# Patient Record
Sex: Male | Born: 1960
Health system: Southern US, Community
[De-identification: ages and names within clinical notes are randomized; demographics above are authoritative.]

## PROBLEM LIST (undated history)

## (undated) DIAGNOSIS — E785 Hyperlipidemia, unspecified: Secondary | ICD-10-CM

## (undated) DIAGNOSIS — E119 Type 2 diabetes mellitus without complications: Secondary | ICD-10-CM

## (undated) HISTORY — DX: Hyperlipidemia, unspecified: E78.5

## (undated) HISTORY — DX: Type 2 diabetes mellitus without complications: E11.9

## (undated) HISTORY — PX: NO PAST SURGERIES: SHX2092

---

## 2014-07-20 ENCOUNTER — Encounter: Payer: Self-pay | Admitting: Nurse Practitioner

## 2014-07-20 ENCOUNTER — Ambulatory Visit (INDEPENDENT_AMBULATORY_CARE_PROVIDER_SITE_OTHER): Payer: Commercial Managed Care - HMO | Admitting: Nurse Practitioner

## 2014-07-20 ENCOUNTER — Ambulatory Visit (INDEPENDENT_AMBULATORY_CARE_PROVIDER_SITE_OTHER): Payer: Commercial Managed Care - HMO

## 2014-07-20 VITALS — BP 130/91 | HR 92 | Temp 97.2°F | Ht 69.0 in | Wt 190.0 lb

## 2014-07-20 DIAGNOSIS — Z87891 Personal history of nicotine dependence: Secondary | ICD-10-CM

## 2014-07-20 DIAGNOSIS — R739 Hyperglycemia, unspecified: Secondary | ICD-10-CM

## 2014-07-20 DIAGNOSIS — Z713 Dietary counseling and surveillance: Secondary | ICD-10-CM

## 2014-07-20 DIAGNOSIS — H3552 Pigmentary retinal dystrophy: Secondary | ICD-10-CM | POA: Insufficient documentation

## 2014-07-20 DIAGNOSIS — Z1211 Encounter for screening for malignant neoplasm of colon: Secondary | ICD-10-CM

## 2014-07-20 DIAGNOSIS — Z0000 Encounter for general adult medical examination without abnormal findings: Secondary | ICD-10-CM

## 2014-07-20 DIAGNOSIS — Z6828 Body mass index (BMI) 28.0-28.9, adult: Secondary | ICD-10-CM

## 2014-07-20 LAB — POCT CBC
Granulocyte percent: 59.4 %G (ref 37–80)
HEMATOCRIT: 52.8 % (ref 43.5–53.7)
Hemoglobin: 17 g/dL (ref 14.1–18.1)
LYMPH, POC: 2.5 (ref 0.6–3.4)
MCH: 29.4 pg (ref 27–31.2)
MCHC: 32.2 g/dL (ref 31.8–35.4)
MCV: 91.3 fL (ref 80–97)
MPV: 9 fL (ref 0–99.8)
POC Granulocyte: 4.1 (ref 2–6.9)
POC LYMPH PERCENT: 36.8 %L (ref 10–50)
Platelet Count, POC: 253 10*3/uL (ref 142–424)
RBC: 5.8 M/uL (ref 4.69–6.13)
RDW, POC: 12.8 %
WBC: 6.9 10*3/uL (ref 4.6–10.2)

## 2014-07-20 NOTE — Progress Notes (Addendum)
   Subjective:    Patient ID: Patrick Valentine, male    DOB: 02/22/1961, 53 y.o.   MRN: [id number]  HPI Patient in today for CPE - He is doing well- Is on no meds and has no medical problems other than retinitis pigmentosis- see eye dr every 6 months    Review of Systems  Constitutional: Negative.   HENT: Negative.   Respiratory: Negative.   Cardiovascular: Negative for chest pain, palpitations and leg swelling.  Gastrointestinal: Negative.   Endocrine: Negative.   Genitourinary: Negative.   Neurological: Negative.   Hematological: Negative.   Psychiatric/Behavioral: Negative.   All other systems reviewed and are negative.      Objective:   Physical Exam  Constitutional: He is oriented to person, place, and time. He appears well-developed and well-nourished.  HENT:  Head: Normocephalic.  Right Ear: External ear normal.  Left Ear: External ear normal.  Nose: Nose normal.  Mouth/Throat: Oropharynx is clear and moist.  Eyes: EOM are normal. Pupils are equal, round, and reactive to light.  Neck: Normal range of motion. Neck supple. No JVD present. No thyromegaly present.  Cardiovascular: Normal rate, regular rhythm, normal heart sounds and intact distal pulses.  Exam reveals no gallop and no friction rub.   No murmur heard. Pulmonary/Chest: Effort normal and breath sounds normal. No respiratory distress. He has no wheezes. He has no rales. He exhibits no tenderness.  Abdominal: Soft. Bowel sounds are normal. He exhibits no mass. There is no tenderness.  Musculoskeletal: Normal range of motion. He exhibits no edema.  Lymphadenopathy:    He has no cervical adenopathy.  Neurological: He is alert and oriented to person, place, and time. No cranial nerve deficit.  Skin: Skin is warm and dry.  Psychiatric: He has a normal mood and affect. His behavior is normal. Judgment and thought content normal.   BP 130/91  Pulse 92  Temp(Src) 97.2 F (36.2 C) (Oral)  Ht $R'5\' 9"'EO$  (1.753 m)  Wt  190 lb (86.183 kg)  BMI 28.05 kg/m2  Chest x ray- normal-Preliminary reading by Patrick Collum, FNP  [facility]  Patrick Nissen, FNP       Assessment & Plan:   1. BMI 28.0-28.9,adult   2. Weight loss counseling, encounter for   3. Retinitis pigmentosa of both eyes   4. Smoking history   5. Annual physical exam   6. Encounter for screening colonoscopy    Orders Placed This Encounter  Procedures  . DG Chest 2 View    Standing Status: Future     Number of Occurrences:      Standing Expiration Date: 09/19/2015    Order Specific Question:  Reason for Exam (SYMPTOM  OR DIAGNOSIS REQUIRED)    Answer:  screening    Order Specific Question:  Preferred imaging location?    Answer:  Internal  . CMP14+EGFR  . NMR, lipoprofile  . Thyroid Panel With TSH  . PSA, total and free  . POCT CBC  . EKG 12-Lead   scheding colonoscopy Labs pending Health maintenance reviewed Diet and exercise encouraged Continue all meds Follow up  In 1 year   [name], FNP

## 2014-07-20 NOTE — Patient Instructions (Signed)

## 2014-07-21 ENCOUNTER — Other Ambulatory Visit: Payer: Self-pay | Admitting: Nurse Practitioner

## 2014-07-21 LAB — CMP14+EGFR
A/G RATIO: 1.9 (ref 1.1–2.5)
ALK PHOS: 94 IU/L (ref 39–117)
ALT: 65 IU/L — AB (ref 0–44)
AST: 31 IU/L (ref 0–40)
Albumin: 4.6 g/dL (ref 3.5–5.5)
BUN / CREAT RATIO: 11 (ref 9–20)
BUN: 11 mg/dL (ref 6–24)
CALCIUM: 9.8 mg/dL (ref 8.7–10.2)
CO2: 24 mmol/L (ref 18–29)
CREATININE: 1 mg/dL (ref 0.76–1.27)
Chloride: 92 mmol/L — ABNORMAL LOW (ref 97–108)
GFR, EST AFRICAN AMERICAN: 99 mL/min/{1.73_m2} (ref >59)
GFR, EST NON AFRICAN AMERICAN: 86 mL/min/{1.73_m2} (ref >59)
GLOBULIN, TOTAL: 2.4 g/dL (ref 1.5–4.5)
Glucose: 270 mg/dL — ABNORMAL HIGH (ref 65–99)
POTASSIUM: 4.5 mmol/L (ref 3.5–5.2)
SODIUM: 132 mmol/L — AB (ref 134–144)
Total Bilirubin: 0.6 mg/dL (ref 0.0–1.2)
Total Protein: 7 g/dL (ref 6.0–8.5)

## 2014-07-21 LAB — NMR, LIPOPROFILE
Cholesterol: 359 mg/dL — ABNORMAL HIGH (ref 100–199)
HDL CHOLESTEROL BY NMR: 40 mg/dL (ref >39)
HDL PARTICLE NUMBER: 33 umol/L (ref >=30.5)
LDL Particle Number: 2598 nmol/L — ABNORMAL HIGH (ref <1000)
LDL SIZE: 20 nm (ref >20.5)
LDLC SERPL CALC-MCNC: 262 mg/dL — ABNORMAL HIGH (ref 0–99)
LP-IR Score: 54 — ABNORMAL HIGH (ref <=45)
Small LDL Particle Number: 1868 nmol/L — ABNORMAL HIGH (ref <=527)
Triglycerides by NMR: 284 mg/dL — ABNORMAL HIGH (ref 0–149)

## 2014-07-21 LAB — PSA, TOTAL AND FREE
PSA, Free Pct: 18.4 %
PSA, Free: 0.46 ng/mL
PSA: 2.5 ng/mL (ref 0.0–4.0)

## 2014-07-21 LAB — THYROID PANEL WITH TSH
Free Thyroxine Index: 2.2 (ref 1.2–4.9)
T3 Uptake Ratio: 29 % (ref 24–39)
T4, Total: 7.7 ug/dL (ref 4.5–12.0)
TSH: 2.82 u[IU]/mL (ref 0.450–4.500)

## 2014-07-21 MED ORDER — ROSUVASTATIN CALCIUM 20 MG PO TABS: 20.0000 mg | ORAL_TABLET | Freq: Every day | ORAL | Status: DC

## 2014-07-21 NOTE — Addendum Note (Signed)
Addended by: Chevis Pretty on: 07/21/2014 08:18 AM   Modules accepted: Orders

## 2014-07-23 ENCOUNTER — Other Ambulatory Visit: Payer: Self-pay | Admitting: Nurse Practitioner

## 2014-07-23 ENCOUNTER — Telehealth: Payer: Self-pay | Admitting: Nurse Practitioner

## 2014-07-23 LAB — POCT GLYCOSYLATED HEMOGLOBIN (HGB A1C)

## 2014-07-23 MED ORDER — METFORMIN HCL 1000 MG PO TABS: 1000.0000 mg | ORAL_TABLET | Freq: Two times a day (BID) | ORAL | Status: DC

## 2014-07-23 NOTE — Telephone Encounter (Signed)
FYI: Crestor cost $45 monthly. He will try it for a few months and see how he tolerates it and how his cholesterol levels are after a few months.   Discussed A1C results with patient as well. He wants to know if he can adjust diet to control the level. Explained that initially he may start out on medication in conjunction with diet and exercise to manage his levels but that his provider will have recommendations for treatment.

## 2014-07-23 NOTE — Addendum Note (Signed)
Addended by: Wyline Mood on: 07/23/2014 08:26 AM   Modules accepted: Orders

## 2014-07-24 ENCOUNTER — Encounter: Payer: Self-pay | Admitting: Internal Medicine

## 2014-08-09 ENCOUNTER — Ambulatory Visit (INDEPENDENT_AMBULATORY_CARE_PROVIDER_SITE_OTHER): Payer: Commercial Managed Care - HMO | Admitting: Pharmacist

## 2014-08-09 ENCOUNTER — Encounter: Payer: Self-pay | Admitting: Pharmacist

## 2014-08-09 VITALS — BP 132/77 | HR 82 | Ht 69.0 in | Wt 190.0 lb

## 2014-08-09 DIAGNOSIS — E119 Type 2 diabetes mellitus without complications: Secondary | ICD-10-CM | POA: Insufficient documentation

## 2014-08-09 DIAGNOSIS — E785 Hyperlipidemia, unspecified: Secondary | ICD-10-CM

## 2014-08-09 DIAGNOSIS — E1169 Type 2 diabetes mellitus with other specified complication: Secondary | ICD-10-CM

## 2014-08-09 MED ORDER — GLUCOSE BLOOD VI STRP: ORAL_STRIP | Status: DC

## 2014-08-09 MED ORDER — ONETOUCH DELICA LANCETS FINE MISC: Status: DC

## 2014-08-09 NOTE — Progress Notes (Signed)
Subjective:    Eyoel Throgmorton is a 53 y.o. male who presents for an initial evaluation and education of Type 2 diabetes mellitus.  Current symptoms/problems include polydipsia and have been improving. Symptoms have been present for 4 months.  The patient was initially diagnosed with Type 2 diabetes mellitus based on the following criteria:  A1c of 12.6% and RBG of 270.  Patient was started on metformin 1000mg  1 tablet BID 07/23/14.  He c/o of diarrhea, decreased appetite and fatigue since starting metformin.  Known diabetic complications: none Cardiovascular risk factors: diabetes mellitus, dyslipidemia and male gender  Eye exam current (within one year): no Weight trend: stable Prior visit with dietician: no Current diet: in general, an "unhealthy" diet Current exercise: yard work  Current monitoring regimen: none Home blood sugar records: n/a - patient doen't have glucometer at home Any episodes of hypoglycemia? no  Is He on ACE inhibitor or angiotensin II receptor blocker?  No   The following portions of the patient's history were reviewed and updated as appropriate: allergies, current medications, past family history, past medical history, past social history and problem list.  Objective:    BP 132/77  Pulse 82  Ht 5\' 9"  (1.753 m)  Wt 190 lb (86.183 kg)  BMI 28.05 kg/m2   Lab Review Glucose (mg/dL)  Date Value  07/20/2014 270*     CO2 (mmol/L)  Date Value  07/20/2014 24      BUN (mg/dL)  Date Value  07/20/2014 11      Creatinine, Ser (mg/dL)  Date Value  07/20/2014 1.00    A1c = 12.6% 07/21/14  Assessment:    Diabetes Mellitus type II, under inadequate control - newly diagnosed 07/21/2014 Hyperlipidemia - just started crestor.    Plan:    1.  Rx changes: to decreased side effects decrease metformin to 1/2 tablet BID for next week and then try to increase back to 1000mg  bid 2.  Education: Reviewed 'ABCs' of diabetes management (respective goals in  parentheses):  A1C (<7), blood pressure (<130/80), and cholesterol (LDL <100). 3.  Compliance at present is estimated to be fair. Efforts to improve compliance (if necessary) will be directed at dietary modifications: discussed CHO counting indepth with patinet and wife.  45g per meal and 15 grams per snack, increased exercise and regular blood sugar monitoring: one times daily 4. Patient given glucometer (One Touch Verio) in office today.  Taught how to use glucometer, BG goals and when to check.  5. Follow up: 2 months   Cherre Robins, PharmD, CPP, CDE

## 2014-08-09 NOTE — Patient Instructions (Addendum)
Diabetes and Standards of Medical Care  Diabetes is complicated. You may find that your diabetes team includes a dietitian, nurse, diabetes educator, eye doctor, and more. To help everyone know what is going on and to help you get the care you deserve, the following schedule of care was developed to help keep you on track. Below are the tests, exams, vaccines, medicines, education, and plans you will need.  Blood Glucose Goals Prior to meals = 80 - 130 Within 2 hours of the start of a meal = less than 180  HbA1c test (goal is less than 7.0% - your last value was 12.6%) This test shows how well you have controlled your glucose over the past 2 3 months. It is used to see if your diabetes management plan needs to be adjusted.   It is performed at least 2 times a year if you are meeting treatment goals.  It is performed 4 times a year if therapy has changed or if you are not meeting treatment goals.   Blood pressure test  This test is performed at every routine medical visit. The goal is less than 140/90 mmHg for most people, but 130/80 mmHg in some cases. Ask your health care provider about your goal. Dental exam  Follow up with the dentist regularly. Eye exam  If you are diagnosed with type 1 diabetes as a child, get an exam upon reaching the age of 29 years or older and have had diabetes for 3 5 years. Yearly eye exams are recommended after that initial eye exam.  If you are diagnosed with type 1 diabetes as an adult, get an exam within 5 years of diagnosis and then yearly.  If you are diagnosed with type 2 diabetes, get an exam as soon as possible after the diagnosis and then yearly. Foot care exam  Visual foot exams are performed at every routine medical visit. The exams check for cuts, injuries, or other problems with the feet.  A comprehensive foot exam should be done yearly. This includes visual inspection as well as assessing foot pulses and testing for loss of  sensation.  Check your feet nightly for cuts, injuries, or other problems with your feet. Tell your health care provider if anything is not healing. Kidney function test (urine microalbumin)  This test is performed once a year.  Type 1 diabetes: The first test is performed 5 years after diagnosis.  Type 2 diabetes: The first test is performed at the time of diagnosis.  A serum creatinine and estimated glomerular filtration rate (eGFR) test is done once a year to assess the level of chronic kidney disease (CKD), if present. Lipid profile (cholesterol, HDL, LDL, triglycerides)  Performed every 5 years for most people.  The goal for LDL is less than 100 mg/dL. If you are at high risk, the goal is less than 70 mg/dL.  The goal for HDL is 40 mg/dL 50 mg/dL for men and 50 mg/dL 60 mg/dL for women. An HDL cholesterol of 60 mg/dL or higher gives some protection against heart disease.  The goal for triglycerides is less than 150 mg/dL. Influenza vaccine, pneumococcal vaccine, and hepatitis B vaccine  The influenza vaccine is recommended yearly.  The pneumococcal vaccine is generally given once in a lifetime. However, there are some instances when another vaccination is recommended. Check with your health care provider.  The hepatitis B vaccine is also recommended for adults with diabetes. Diabetes self-management education  Education is recommended at diagnosis and ongoing  as needed. Treatment plan  Your treatment plan is reviewed at every medical visit. Document Released: 09/27/2009 Document Revised: 08/02/2013 Document Reviewed: 05/02/2013 [facility] Patient Information 2014 [place].

## 2014-09-10 ENCOUNTER — Ambulatory Visit (AMBULATORY_SURGERY_CENTER): Payer: Self-pay | Admitting: *Deleted

## 2014-09-10 VITALS — Ht 69.0 in | Wt 188.2 lb

## 2014-09-10 DIAGNOSIS — Z1211 Encounter for screening for malignant neoplasm of colon: Secondary | ICD-10-CM

## 2014-09-10 MED ORDER — MOVIPREP 100 G PO SOLR: ORAL | Status: DC

## 2014-09-10 NOTE — Progress Notes (Signed)
No allergies to eggs or soy. No prior anesthesia.  Pt given Emmi instructions for colonoscopy  No oxygen use  No diet drug use  

## 2014-09-24 ENCOUNTER — Ambulatory Visit (AMBULATORY_SURGERY_CENTER): Payer: Commercial Managed Care - HMO | Admitting: Internal Medicine

## 2014-09-24 ENCOUNTER — Encounter: Payer: Self-pay | Admitting: Internal Medicine

## 2014-09-24 VITALS — BP 135/90 | HR 68 | Temp 97.4°F | Resp 22 | Ht 69.0 in | Wt 188.0 lb

## 2014-09-24 DIAGNOSIS — D123 Benign neoplasm of transverse colon: Secondary | ICD-10-CM | POA: Diagnosis not present

## 2014-09-24 DIAGNOSIS — Z1211 Encounter for screening for malignant neoplasm of colon: Secondary | ICD-10-CM

## 2014-09-24 DIAGNOSIS — D125 Benign neoplasm of sigmoid colon: Secondary | ICD-10-CM

## 2014-09-24 MED ORDER — SODIUM CHLORIDE 0.9 % IV SOLN: 500.0000 mL | INTRAVENOUS | Status: DC

## 2014-09-24 NOTE — Progress Notes (Signed)
Procedure ends, to recovery, report given and VSS. 

## 2014-09-24 NOTE — Progress Notes (Signed)
Called to room to assist during endoscopic procedure.  Patient ID and intended procedure confirmed with present staff. Received instructions for my participation in the procedure from the performing physician.  

## 2014-09-24 NOTE — Patient Instructions (Signed)
Discharge instructions given with verbal understanding. Handouts on polyps and diverticulosis. Resume previous medications. YOU HAD AN ENDOSCOPIC PROCEDURE TODAY AT THE [facility]: Refer to the procedure report that was given to you for any specific questions about what was found during the examination.  If the procedure report does not answer your questions, please call your gastroenterologist to clarify.  If you requested that your care partner not be given the details of your procedure findings, then the procedure report has been included in a sealed envelope for you to review at your convenience later.  YOU SHOULD EXPECT: Some feelings of bloating in the abdomen. Passage of more gas than usual.  Walking can help get rid of the air that was put into your GI tract during the procedure and reduce the bloating. If you had a lower endoscopy (such as a colonoscopy or flexible sigmoidoscopy) you may notice spotting of blood in your stool or on the toilet paper. If you underwent a bowel prep for your procedure, then you may not have a normal bowel movement for a few days.  DIET: Your first meal following the procedure should be a light meal and then it is ok to progress to your normal diet.  A half-sandwich or bowl of soup is an example of a good first meal.  Heavy or fried foods are harder to digest and may make you feel nauseous or bloated.  Likewise meals heavy in dairy and vegetables can cause extra gas to form and this can also increase the bloating.  Drink plenty of fluids but you should avoid alcoholic beverages for 24 hours.  ACTIVITY: Your care partner should take you home directly after the procedure.  You should plan to take it easy, moving slowly for the rest of the day.  You can resume normal activity the day after the procedure however you should NOT DRIVE or use heavy machinery for 24 hours (because of the sedation medicines used during the test).    SYMPTOMS TO REPORT  IMMEDIATELY: A gastroenterologist can be reached at any hour.  During normal business hours, 8:30 AM to 5:00 PM Monday through Friday, call [phone].  After hours and on weekends, please call the GI answering service at [phone] who will take a message and have the physician on call contact you.   Following lower endoscopy (colonoscopy or flexible sigmoidoscopy):  Excessive amounts of blood in the stool  Significant tenderness or worsening of abdominal pains  Swelling of the abdomen that is new, acute  Fever of 100F or higher  FOLLOW UP: If any biopsies were taken you will be contacted by phone or by letter within the next 1-3 weeks.  Call your gastroenterologist if you have not heard about the biopsies in 3 weeks.  Our staff will call the home number listed on your records the next business day following your procedure to check on you and address any questions or concerns that you may have at that time regarding the information given to you following your procedure. This is a courtesy call and so if there is no answer at the home number and we have not heard from you through the emergency physician on call, we will assume that you have returned to your regular daily activities without incident.  SIGNATURES/CONFIDENTIALITY: You and/or your care partner have signed paperwork which will be entered into your electronic medical record.  These signatures attest to the fact that that the information above on your After Visit Summary   has been reviewed and is understood.  Full responsibility of the confidentiality of this discharge information lies with you and/or your care-partner. 

## 2014-09-24 NOTE — Op Note (Signed)
[name], [id number]   COLONOSCOPY PROCEDURE REPORT  PATIENT: Patrick Valentine, Patrick Valentine  MR#: [id number] BIRTHDATE: [birth year] , 56  yrs. old GENDER: male ENDOSCOPIST: Jerene Bears, MD REFERRED HL:[id number] Laurance Flatten, M.D. PROCEDURE DATE:  09/24/2014 PROCEDURE:   Colonoscopy with snare polypectomy First Screening Colonoscopy - Avg.  risk and is 50 yrs.  old or older Yes.  Prior Negative Screening - Now for repeat screening. N/A  History of Adenoma - Now for follow-up colonoscopy & has been > or = to 3 yrs.  N/A  Polyps Removed Today? Yes. ASA CLASS:   Class II INDICATIONS:average risk for colorectal cancer and first colonoscopy. MEDICATIONS: Monitored anesthesia care and Propofol 320 mg IV  DESCRIPTION OF PROCEDURE:   After the risks benefits and alternatives of the procedure were thoroughly explained, informed consent was obtained.  The digital rectal exam revealed no rectal mass.   The LB [id number]  endoscope was introduced through the anus and advanced to the cecum, which was identified by both the appendix and ileocecal valve. No adverse events experienced. The quality of the prep was good, using MoviPrep  The instrument was then slowly withdrawn as the colon was fully examined.   COLON FINDINGS: A sessile polyp measuring 2 mm in size was found at the hepatic flexure.  A polypectomy was performed with a cold snare.  The resection was complete, the polyp tissue was completely retrieved and sent to histology.   A semi-pedunculated polyp measuring 6 mm in size was found in the sigmoid colon.  A polypectomy was performed using snare cautery.  The resection was complete, the polyp tissue was completely retrieved and sent to histology.   There was mild diverticulosis noted in the sigmoid colon.  Retroflexed views revealed internal hemorrhoids. The time to cecum=3 minutes 56 seconds.  Withdrawal time=12 minutes 36 seconds.  The scope was  withdrawn and the procedure completed. COMPLICATIONS: There were no immediate complications.  ENDOSCOPIC IMPRESSION: 1.   Sessile polyp was found at the hepatic flexure; polypectomy was performed with a cold snare 2.   Semi-pedunculated polyp was found in the sigmoid colon; polypectomy was performed using snare cautery 3.   Mild diverticulosis was noted in the sigmoid colon  RECOMMENDATIONS: 1.  Await pathology results 2.  High fiber diet 3.  If the polyps removed today are proven to be adenomatous (pre-cancerous) polyps, you will need a repeat colonoscopy in 5 years.  Otherwise you should continue to follow colorectal cancer screening guidelines for "routine risk" patients with colonoscopy in 10 years.  You will receive a letter within 1-2 weeks with the results of your biopsy as well as final recommendations.  Please call my office if you have not received a letter after 3 weeks.  eSigned:  Jerene Bears, MD 09/24/2014 9:01 AM       cc: The Patient and Redge Gainer, MD

## 2014-09-24 NOTE — Progress Notes (Signed)
Patient denies any allergies to eggs or soy. No past surgeries per pt.

## 2014-09-25 ENCOUNTER — Telehealth: Payer: Self-pay

## 2014-09-25 NOTE — Telephone Encounter (Signed)
  Follow up Call-  Call back number 09/24/2014  Post procedure Call Back phone  # [phone]  Permission to leave phone message Yes     Patient questions:  Do you have a fever, pain , or abdominal swelling? No. Pain Score  0 *  Have you tolerated food without any problems? Yes.    Have you been able to return to your normal activities? Yes.    Do you have any questions about your discharge instructions: Diet   No. Medications  No. Follow up visit  No.  Do you have questions or concerns about your Care? No.  Actions: * If pain score is 4 or above: No action needed, pain <4.

## 2014-09-27 ENCOUNTER — Encounter: Payer: Self-pay | Admitting: Internal Medicine

## 2014-10-25 ENCOUNTER — Encounter: Payer: Self-pay | Admitting: Nurse Practitioner

## 2014-10-25 ENCOUNTER — Ambulatory Visit (INDEPENDENT_AMBULATORY_CARE_PROVIDER_SITE_OTHER): Payer: Commercial Managed Care - HMO | Admitting: Nurse Practitioner

## 2014-10-25 VITALS — BP 140/92 | HR 84 | Temp 99.7°F | Ht 69.0 in | Wt 183.4 lb

## 2014-10-25 DIAGNOSIS — E119 Type 2 diabetes mellitus without complications: Secondary | ICD-10-CM

## 2014-10-25 DIAGNOSIS — E1169 Type 2 diabetes mellitus with other specified complication: Secondary | ICD-10-CM

## 2014-10-25 DIAGNOSIS — Z23 Encounter for immunization: Secondary | ICD-10-CM

## 2014-10-25 DIAGNOSIS — E785 Hyperlipidemia, unspecified: Secondary | ICD-10-CM

## 2014-10-25 LAB — POCT GLYCOSYLATED HEMOGLOBIN (HGB A1C): Hemoglobin A1C: 6.3

## 2014-10-25 LAB — POCT UA - MICROALBUMIN: Microalbumin Ur, POC: NEGATIVE mg/L

## 2014-10-25 MED ORDER — ROSUVASTATIN CALCIUM 20 MG PO TABS: 20.0000 mg | ORAL_TABLET | Freq: Every day | ORAL | Status: DC

## 2014-10-25 MED ORDER — METFORMIN HCL 1000 MG PO TABS: 1000.0000 mg | ORAL_TABLET | Freq: Two times a day (BID) | ORAL | Status: DC

## 2014-10-25 NOTE — Progress Notes (Signed)
Subjective:    Patient ID: Patrick Valentine, male    DOB: March 30, 1961, 53 y.o.   MRN: [id number]  Hyperlipidemia This is a chronic problem. The current episode started more than 1 year ago. The problem is uncontrolled. Recent lipid tests were reviewed and are high. Exacerbating diseases include diabetes. He has no history of hypothyroidism or obesity. Pertinent negatives include no chest pain. Current antihyperlipidemic treatment includes statins. Improvement on treatment: first time having labs drawn since going on meds. Compliance problems include adherence to diet and adherence to exercise.  Risk factors for coronary artery disease include diabetes mellitus, dyslipidemia and male sex.  Diabetes He presents for his follow-up diabetic visit. He has type 2 diabetes mellitus. No MedicAlert identification noted. His disease course has been improving. Pertinent negatives for diabetes include no chest pain. Symptoms are improving. Current diabetic treatment includes oral agent (monotherapy). He is compliant with treatment all of the time. His weight is stable. He is following a diabetic diet. He has not had a previous visit with a dietitian. He participates in exercise every other day. His home blood glucose trend is decreasing steadily. His breakfast blood glucose is taken between 8-9 am. His breakfast blood glucose range is generally 110-130 mg/dl. His overall blood glucose range is 110-130 mg/dl. An ACE inhibitor/angiotensin II receptor blocker is not being taken. He does not see a podiatrist.Eye exam is not current.   Patient here today for follow up of chronic medical problems.   Review of Systems  Constitutional: Negative.   HENT: Negative.   Respiratory: Negative.   Cardiovascular: Negative for chest pain, palpitations and leg swelling.  Gastrointestinal: Negative.   Endocrine: Negative.   Genitourinary: Negative.   Neurological: Negative.   Hematological: Negative.   Psychiatric/Behavioral:  Negative.   All other systems reviewed and are negative.      Objective:   Physical Exam  Constitutional: He is oriented to person, place, and time. He appears well-developed and well-nourished.  HENT:  Head: Normocephalic.  Right Ear: External ear normal.  Left Ear: External ear normal.  Nose: Nose normal.  Mouth/Throat: Oropharynx is clear and moist.  Eyes: EOM are normal. Pupils are equal, round, and reactive to light.  Neck: Normal range of motion. Neck supple. No JVD present. No thyromegaly present.  Cardiovascular: Normal rate, regular rhythm, normal heart sounds and intact distal pulses.  Exam reveals no gallop and no friction rub.   No murmur heard. Pulmonary/Chest: Effort normal and breath sounds normal. No respiratory distress. He has no wheezes. He has no rales. He exhibits no tenderness.  Abdominal: Soft. Bowel sounds are normal. He exhibits no mass. There is no tenderness.  Genitourinary: Prostate normal and penis normal.  Musculoskeletal: Normal range of motion. He exhibits no edema.  Lymphadenopathy:    He has no cervical adenopathy.  Neurological: He is alert and oriented to person, place, and time. No cranial nerve deficit.  Skin: Skin is warm and dry.  Psychiatric: He has a normal mood and affect. His behavior is normal. Judgment and thought content normal.   BP 140/92 mmHg  Pulse 84  Temp(Src) 99.7 F (37.6 C) (Oral)  Ht 5' 9"  (1.753 m)  Wt 183 lb 6.4 oz (83.19 kg)  BMI 27.07 kg/m2  Results for orders placed or performed in visit on 10/25/14  POCT glycosylated hemoglobin (Hb A1C)  Result Value Ref Range   Hemoglobin A1C 6.3          Assessment & Plan:   1.  Hyperlipidemia associated with type 2 diabetes mellitus Low fat diet - CMP14+EGFR - NMR, lipoprofile - metFORMIN (GLUCOPHAGE) 1000 MG tablet; Take 1 tablet (1,000 mg total) by mouth 2 (two) times daily with a meal.  Dispense: 180 tablet; Refill: 1  2. Type 2 diabetes mellitus not at  goal Continue carb counting - POCT glycosylated hemoglobin (Hb A1C) - POCT UA - Microalbumin - rosuvastatin (CRESTOR) 20 MG tablet; Take 1 tablet (20 mg total) by mouth daily.  Dispense: 90 tablet; Refill: 1  3. Encounter for immunization    Labs pending Health maintenance reviewed Diet and exercise encouraged Continue all meds Follow up  In 3 month   [place], FNP

## 2014-10-25 NOTE — Patient Instructions (Signed)

## 2014-10-26 LAB — NMR, LIPOPROFILE
Cholesterol: 254 mg/dL — ABNORMAL HIGH (ref 100–199)
HDL CHOLESTEROL BY NMR: 45 mg/dL (ref >39)
HDL Particle Number: 29 umol/L — ABNORMAL LOW (ref >=30.5)
LDL Particle Number: 2820 nmol/L — ABNORMAL HIGH (ref <1000)
LDL Size: 20.4 nm (ref >20.5)
LDL-C: 182 mg/dL — AB (ref 0–99)
LP-IR Score: 61 — ABNORMAL HIGH (ref <=45)
Small LDL Particle Number: 1832 nmol/L — ABNORMAL HIGH (ref <=527)
Triglycerides by NMR: 133 mg/dL (ref 0–149)

## 2014-10-26 LAB — CMP14+EGFR
ALT: 34 IU/L (ref 0–44)
AST: 26 IU/L (ref 0–40)
Albumin/Globulin Ratio: 2 (ref 1.1–2.5)
Albumin: 4.8 g/dL (ref 3.5–5.5)
Alkaline Phosphatase: 59 IU/L (ref 39–117)
BUN/Creatinine Ratio: 14 (ref 9–20)
BUN: 13 mg/dL (ref 6–24)
CALCIUM: 10.2 mg/dL (ref 8.7–10.2)
CO2: 24 mmol/L (ref 18–29)
CREATININE: 0.95 mg/dL (ref 0.76–1.27)
Chloride: 100 mmol/L (ref 97–108)
GFR calc Af Amer: 104 mL/min/{1.73_m2} (ref >59)
GFR calc non Af Amer: 90 mL/min/{1.73_m2} (ref >59)
GLUCOSE: 131 mg/dL — AB (ref 65–99)
Globulin, Total: 2.4 g/dL (ref 1.5–4.5)
POTASSIUM: 5.3 mmol/L — AB (ref 3.5–5.2)
Sodium: 140 mmol/L (ref 134–144)
TOTAL PROTEIN: 7.2 g/dL (ref 6.0–8.5)
Total Bilirubin: 0.6 mg/dL (ref 0.0–1.2)

## 2014-10-31 ENCOUNTER — Telehealth: Payer: Self-pay | Admitting: Nurse Practitioner

## 2014-10-31 NOTE — Telephone Encounter (Signed)
Pt aware of lab values, had questions about cholesterol results. Reassured him that values have come down since Sept and to continue meds and exercise.

## 2015-01-15 ENCOUNTER — Telehealth: Payer: Self-pay | Admitting: Nurse Practitioner

## 2015-01-15 DIAGNOSIS — L989 Disorder of the skin and subcutaneous tissue, unspecified: Secondary | ICD-10-CM

## 2015-01-23 NOTE — Telephone Encounter (Signed)
Patient wants referral to dermatology to check a few places that are of concern. He would like to go [facility] [place].

## 2015-01-24 ENCOUNTER — Other Ambulatory Visit: Payer: Self-pay | Admitting: *Deleted

## 2015-01-24 ENCOUNTER — Telehealth: Payer: Self-pay | Admitting: Nurse Practitioner

## 2015-01-24 DIAGNOSIS — E785 Hyperlipidemia, unspecified: Principal | ICD-10-CM

## 2015-01-24 DIAGNOSIS — E1169 Type 2 diabetes mellitus with other specified complication: Secondary | ICD-10-CM

## 2015-01-24 DIAGNOSIS — E119 Type 2 diabetes mellitus without complications: Secondary | ICD-10-CM

## 2015-01-24 MED ORDER — METFORMIN HCL 1000 MG PO TABS: 1000.0000 mg | ORAL_TABLET | Freq: Two times a day (BID) | ORAL | Status: DC

## 2015-01-24 MED ORDER — ROSUVASTATIN CALCIUM 20 MG PO TABS: 20.0000 mg | ORAL_TABLET | Freq: Every day | ORAL | Status: DC

## 2015-01-24 NOTE — Telephone Encounter (Signed)
Patient is due for a follow up appointment with Shelah Lewandowsky.

## 2015-01-24 NOTE — Telephone Encounter (Signed)
Referral made 

## 2015-01-24 NOTE — Telephone Encounter (Signed)
Patient scheduled with M.Martin for diabetic follow up.  One refill of metformin and crestor sent in to [facility].

## 2015-02-01 ENCOUNTER — Encounter: Payer: Self-pay | Admitting: Nurse Practitioner

## 2015-02-01 ENCOUNTER — Ambulatory Visit (INDEPENDENT_AMBULATORY_CARE_PROVIDER_SITE_OTHER): Payer: Commercial Managed Care - HMO | Admitting: Nurse Practitioner

## 2015-02-01 VITALS — BP 132/87 | HR 77 | Temp 97.0°F | Ht 69.0 in | Wt 187.0 lb

## 2015-02-01 DIAGNOSIS — E119 Type 2 diabetes mellitus without complications: Secondary | ICD-10-CM

## 2015-02-01 DIAGNOSIS — E785 Hyperlipidemia, unspecified: Secondary | ICD-10-CM | POA: Diagnosis not present

## 2015-02-01 DIAGNOSIS — E1169 Type 2 diabetes mellitus with other specified complication: Secondary | ICD-10-CM

## 2015-02-01 LAB — POCT GLYCOSYLATED HEMOGLOBIN (HGB A1C): Hemoglobin A1C: 6.6

## 2015-02-01 MED ORDER — METFORMIN HCL 1000 MG PO TABS: 1000.0000 mg | ORAL_TABLET | Freq: Two times a day (BID) | ORAL | Status: DC

## 2015-02-01 MED ORDER — GLUCOSE BLOOD VI STRP: ORAL_STRIP | Status: DC

## 2015-02-01 MED ORDER — ONETOUCH DELICA LANCETS FINE MISC: Status: DC

## 2015-02-01 MED ORDER — ROSUVASTATIN CALCIUM 20 MG PO TABS: 20.0000 mg | ORAL_TABLET | Freq: Every day | ORAL | Status: DC

## 2015-02-01 NOTE — Patient Instructions (Signed)
Diabetes and Foot Care Diabetes may cause you to have problems because of poor blood supply (circulation) to your feet and legs. This may cause the skin on your feet to become thinner, break easier, and heal more slowly. Your skin may become dry, and the skin may peel and crack. You may also have nerve damage in your legs and feet causing decreased feeling in them. You may not notice minor injuries to your feet that could lead to infections or more serious problems. Taking care of your feet is one of the most important things you can do for yourself.  HOME CARE INSTRUCTIONS  Wear shoes at all times, even in the house. Do not go barefoot. Bare feet are easily injured.  Check your feet daily for blisters, cuts, and redness. If you cannot see the bottom of your feet, use a mirror or ask someone for help.  Wash your feet with warm water (do not use hot water) and mild soap. Then pat your feet and the areas between your toes until they are completely dry. Do not soak your feet as this can dry your skin.  Apply a moisturizing lotion or petroleum jelly (that does not contain alcohol and is unscented) to the skin on your feet and to dry, brittle toenails. Do not apply lotion between your toes.  Trim your toenails straight across. Do not dig under them or around the cuticle. File the edges of your nails with an emery board or nail file.  Do not cut corns or calluses or try to remove them with medicine.  Wear clean socks or stockings every day. Make sure they are not too tight. Do not wear knee-high stockings since they may decrease blood flow to your legs.  Wear shoes that fit properly and have enough cushioning. To break in new shoes, wear them for just a few hours a day. This prevents you from injuring your feet. Always look in your shoes before you put them on to be sure there are no objects inside.  Do not cross your legs. This may decrease the blood flow to your feet.  If you find a minor scrape,  cut, or break in the skin on your feet, keep it and the skin around it clean and dry. These areas may be cleansed with mild soap and water. Do not cleanse the area with peroxide, alcohol, or iodine.  When you remove an adhesive bandage, be sure not to damage the skin around it.  If you have a wound, look at it several times a day to make sure it is healing.  Do not use heating pads or hot water bottles. They may burn your skin. If you have lost feeling in your feet or legs, you may not know it is happening until it is too late.  Make sure your health care provider performs a complete foot exam at least annually or more often if you have foot problems. Report any cuts, sores, or bruises to your health care provider immediately. SEEK MEDICAL CARE IF:   You have an injury that is not healing.  You have cuts or breaks in the skin.  You have an ingrown nail.  You notice redness on your legs or feet.  You feel burning or tingling in your legs or feet.  You have pain or cramps in your legs and feet.  Your legs or feet are numb.  Your feet always feel cold. SEEK IMMEDIATE MEDICAL CARE IF:   There is increasing redness,   swelling, or pain in or around a wound.  There is a red line that goes up your leg.  Pus is coming from a wound.  You develop a fever or as directed by your health care provider.  You notice a bad smell coming from an ulcer or wound. Document Released: 11/27/2000 Document Revised: 08/02/2013 Document Reviewed: 05/09/2013 Exit[employer] Patient Information 2015 [employer]. This information is not intended to replace advice given to you by your health care provider. Make sure you discuss any questions you have with your health care provider.  

## 2015-02-01 NOTE — Progress Notes (Signed)
Subjective:    Patient ID: Patrick Valentine, male    DOB: November 19, 1961, 54 y.o.   MRN: [id number]   Patient here today for follow up of chronic medical problems.   Hyperlipidemia This is a chronic problem. The current episode started more than 1 year ago. The problem is uncontrolled. Recent lipid tests were reviewed and are high. Exacerbating diseases include diabetes. He has no history of hypothyroidism or obesity. Pertinent negatives include no chest pain. Current antihyperlipidemic treatment includes statins. Improvement on treatment: first time having labs drawn since going on meds. Compliance problems include adherence to diet and adherence to exercise.  Risk factors for coronary artery disease include diabetes mellitus, dyslipidemia and male sex.  Diabetes He presents for his follow-up diabetic visit. He has type 2 diabetes mellitus. No MedicAlert identification noted. His disease course has been improving. Pertinent negatives for diabetes include no chest pain. Symptoms are improving. Current diabetic treatment includes oral agent (monotherapy). He is compliant with treatment all of the time. His weight is stable. He is following a diabetic diet. He has not had a previous visit with a dietitian. He participates in exercise every other day. His home blood glucose trend is decreasing steadily. His breakfast blood glucose is taken between 8-9 am. His breakfast blood glucose range is generally 110-130 mg/dl. His overall blood glucose range is 110-130 mg/dl. An ACE inhibitor/angiotensin II receptor blocker is not being taken. He does not see a podiatrist.Eye exam is not current.      Review of Systems  Constitutional: Negative.   HENT: Negative.   Respiratory: Negative.   Cardiovascular: Negative for chest pain, palpitations and leg swelling.  Gastrointestinal: Negative.   Endocrine: Negative.   Genitourinary: Negative.   Neurological: Negative.   Hematological: Negative.     Psychiatric/Behavioral: Negative.   All other systems reviewed and are negative.      Objective:   Physical Exam  Constitutional: He is oriented to person, place, and time. He appears well-developed and well-nourished.  HENT:  Head: Normocephalic.  Right Ear: External ear normal.  Left Ear: External ear normal.  Nose: Nose normal.  Mouth/Throat: Oropharynx is clear and moist.  Eyes: EOM are normal. Pupils are equal, round, and reactive to light.  Neck: Normal range of motion. Neck supple. No JVD present. No thyromegaly present.  Cardiovascular: Normal rate, regular rhythm, normal heart sounds and intact distal pulses.  Exam reveals no gallop and no friction rub.   No murmur heard. Pulmonary/Chest: Effort normal and breath sounds normal. No respiratory distress. He has no wheezes. He has no rales. He exhibits no tenderness.  Abdominal: Soft. Bowel sounds are normal. He exhibits no mass. There is no tenderness.  Genitourinary: Prostate normal and penis normal.  Musculoskeletal: Normal range of motion. He exhibits no edema.  Lymphadenopathy:    He has no cervical adenopathy.  Neurological: He is alert and oriented to person, place, and time. No cranial nerve deficit.  Skin: Skin is warm and dry.  Psychiatric: He has a normal mood and affect. His behavior is normal. Judgment and thought content normal.   BP 132/87 mmHg  Pulse 77  Temp(Src) 97 F (36.1 C) (Oral)  Ht 5' 9"  (1.753 m)  Wt 187 lb (84.823 kg)  BMI 27.60 kg/m2   Results for orders placed or performed in visit on 02/01/15  POCT glycosylated hemoglobin (Hb A1C)  Result Value Ref Range   Hemoglobin A1C 6.6%           Assessment &  Plan:   1. Hyperlipidemia associated with type 2 diabetes mellitus Continue low fat det - CMP14+EGFR - NMR, lipoprofile - metFORMIN (GLUCOPHAGE) 1000 MG tablet; Take 1 tablet (1,000 mg total) by mouth 2 (two) times daily with a meal.  Dispense: 60 tablet; Refill: 5  2. Type 2  diabetes mellitus not at goal Continue carb counting - POCT glycosylated hemoglobin (Hb A1C) - rosuvastatin (CRESTOR) 20 MG tablet; Take 1 tablet (20 mg total) by mouth daily.  Dispense: 30 tablet; Refill: 5 - glucose blood (ONETOUCH VERIO) test strip; Use to check blood glucose once a day.  Dx:  250.02  Dispense: 100 each; Refill: 2 - ONETOUCH DELICA LANCETS FINE MISC; Use to check BG once daily.  Dx:  250.02  Dispense: 100 each; Refill: 2   encouraged to get eye exam Labs pending Health maintenance reviewed Diet and exercise encouraged Continue all meds Follow up  In 3 month   [place], FNP

## 2015-02-02 LAB — CMP14+EGFR
ALBUMIN: 4.4 g/dL (ref 3.5–5.5)
ALK PHOS: 59 IU/L (ref 39–117)
ALT: 18 IU/L (ref 0–44)
AST: 16 IU/L (ref 0–40)
Albumin/Globulin Ratio: 1.8 (ref 1.1–2.5)
BILIRUBIN TOTAL: 0.4 mg/dL (ref 0.0–1.2)
BUN/Creatinine Ratio: 15 (ref 9–20)
BUN: 14 mg/dL (ref 6–24)
CO2: 26 mmol/L (ref 18–29)
CREATININE: 0.92 mg/dL (ref 0.76–1.27)
Calcium: 9.6 mg/dL (ref 8.7–10.2)
Chloride: 97 mmol/L (ref 97–108)
GFR calc Af Amer: 109 mL/min/{1.73_m2} (ref >59)
GFR calc non Af Amer: 94 mL/min/{1.73_m2} (ref >59)
GLOBULIN, TOTAL: 2.4 g/dL (ref 1.5–4.5)
GLUCOSE: 138 mg/dL — AB (ref 65–99)
POTASSIUM: 4.7 mmol/L (ref 3.5–5.2)
SODIUM: 138 mmol/L (ref 134–144)
Total Protein: 6.8 g/dL (ref 6.0–8.5)

## 2015-02-02 LAB — NMR, LIPOPROFILE
Cholesterol: 206 mg/dL — ABNORMAL HIGH (ref 100–199)
HDL Cholesterol by NMR: 39 mg/dL — ABNORMAL LOW (ref >39)
HDL Particle Number: 33.3 umol/L (ref >=30.5)
LDL Particle Number: 1964 nmol/L — ABNORMAL HIGH (ref <1000)
LDL Size: 20.1 nm (ref >20.5)
LDL-C: 141 mg/dL — AB (ref 0–99)
LP-IR Score: 63 — ABNORMAL HIGH (ref <=45)
SMALL LDL PARTICLE NUMBER: 1245 nmol/L — AB (ref <=527)
TRIGLYCERIDES BY NMR: 130 mg/dL (ref 0–149)

## 2015-02-06 DIAGNOSIS — L814 Other melanin hyperpigmentation: Secondary | ICD-10-CM | POA: Diagnosis not present

## 2015-02-06 DIAGNOSIS — D225 Melanocytic nevi of trunk: Secondary | ICD-10-CM | POA: Diagnosis not present

## 2015-02-06 DIAGNOSIS — D485 Neoplasm of uncertain behavior of skin: Secondary | ICD-10-CM | POA: Diagnosis not present

## 2015-02-06 DIAGNOSIS — D2272 Melanocytic nevi of left lower limb, including hip: Secondary | ICD-10-CM | POA: Diagnosis not present

## 2015-05-03 ENCOUNTER — Ambulatory Visit (INDEPENDENT_AMBULATORY_CARE_PROVIDER_SITE_OTHER): Payer: Commercial Managed Care - HMO | Admitting: Nurse Practitioner

## 2015-05-03 ENCOUNTER — Encounter: Payer: Self-pay | Admitting: Nurse Practitioner

## 2015-05-03 VITALS — BP 146/92 | HR 75 | Temp 96.9°F | Ht 69.0 in | Wt 188.0 lb

## 2015-05-03 DIAGNOSIS — E119 Type 2 diabetes mellitus without complications: Secondary | ICD-10-CM | POA: Diagnosis not present

## 2015-05-03 DIAGNOSIS — E785 Hyperlipidemia, unspecified: Secondary | ICD-10-CM

## 2015-05-03 DIAGNOSIS — E1169 Type 2 diabetes mellitus with other specified complication: Secondary | ICD-10-CM

## 2015-05-03 LAB — POCT GLYCOSYLATED HEMOGLOBIN (HGB A1C): Hemoglobin A1C: 6.6

## 2015-05-03 MED ORDER — METFORMIN HCL 1000 MG PO TABS: 1000.0000 mg | ORAL_TABLET | Freq: Two times a day (BID) | ORAL | Status: DC

## 2015-05-03 MED ORDER — ROSUVASTATIN CALCIUM 20 MG PO TABS: 20.0000 mg | ORAL_TABLET | Freq: Every day | ORAL | Status: DC

## 2015-05-03 NOTE — Progress Notes (Signed)
Subjective:    Patient ID: Patrick Valentine, male    DOB: 1961/11/08, 54 y.o.   MRN: [id number]   Patient here today for follow up of chronic medical problems.   Hyperlipidemia This is a chronic problem. The current episode started more than 1 year ago. The problem is uncontrolled. Recent lipid tests were reviewed and are high. Exacerbating diseases include diabetes. He has no history of hypothyroidism or obesity. Pertinent negatives include no chest pain. Current antihyperlipidemic treatment includes statins. Improvement on treatment: first time having labs drawn since going on meds. Compliance problems include adherence to diet and adherence to exercise.  Risk factors for coronary artery disease include diabetes mellitus, dyslipidemia and male sex.  Diabetes He presents for his follow-up diabetic visit. He has type 2 diabetes mellitus. No MedicAlert identification noted. His disease course has been improving. Pertinent negatives for diabetes include no chest pain. Symptoms are improving. Current diabetic treatment includes oral agent (monotherapy). He is compliant with treatment all of the time. His weight is stable. He is following a diabetic diet. He has not had a previous visit with a dietitian. He participates in exercise every other day. His home blood glucose trend is decreasing steadily. His breakfast blood glucose is taken between 8-9 am. His breakfast blood glucose range is generally 110-130 mg/dl. His overall blood glucose range is 110-130 mg/dl. An ACE inhibitor/angiotensin II receptor blocker is not being taken. He does not see a podiatrist.Eye exam is not current.      Review of Systems  Constitutional: Negative.   HENT: Negative.   Respiratory: Negative.   Cardiovascular: Negative for chest pain, palpitations and leg swelling.  Gastrointestinal: Negative.   Endocrine: Negative.   Genitourinary: Negative.   Neurological: Negative.   Hematological: Negative.     Psychiatric/Behavioral: Negative.   All other systems reviewed and are negative.      Objective:   Physical Exam  Constitutional: He is oriented to person, place, and time. He appears well-developed and well-nourished.  HENT:  Head: Normocephalic.  Right Ear: External ear normal.  Left Ear: External ear normal.  Nose: Nose normal.  Mouth/Throat: Oropharynx is clear and moist.  Eyes: EOM are normal. Pupils are equal, round, and reactive to light.  Neck: Normal range of motion. Neck supple. No JVD present. No thyromegaly present.  Cardiovascular: Normal rate, regular rhythm, normal heart sounds and intact distal pulses.  Exam reveals no gallop and no friction rub.   No murmur heard. Pulmonary/Chest: Effort normal and breath sounds normal. No respiratory distress. He has no wheezes. He has no rales. He exhibits no tenderness.  Abdominal: Soft. Bowel sounds are normal. He exhibits no mass. There is no tenderness.  Genitourinary: Prostate normal and penis normal.  Musculoskeletal: Normal range of motion. He exhibits no edema.  Lymphadenopathy:    He has no cervical adenopathy.  Neurological: He is alert and oriented to person, place, and time. No cranial nerve deficit.  Skin: Skin is warm and dry.  Psychiatric: He has a normal mood and affect. His behavior is normal. Judgment and thought content normal.   BP 146/92 mmHg  Pulse 75  Temp(Src) 96.9 F (36.1 C) (Oral)  Ht 5' 9" (1.753 m)  Wt 188 lb (85.276 kg)  BMI 27.75 kg/m2   Results for orders placed or performed in visit on 05/03/15  POCT glycosylated hemoglobin (Hb A1C)  Result Value Ref Range   Hemoglobin A1C 6.6           Assessment &  Plan:   1. Type 2 diabetes mellitus not at goal Continue to watch carbs in diet - POCT glycosylated hemoglobin (Hb A1C) - rosuvastatin (CRESTOR) 20 MG tablet; Take 1 tablet (20 mg total) by mouth daily.  Dispense: 30 tablet; Refill: 5  2. Hyperlipidemia associated with type 2  diabetes mellitus Watch fats in diet - CMP14+EGFR - NMR, lipoprofile - metFORMIN (GLUCOPHAGE) 1000 MG tablet; Take 1 tablet (1,000 mg total) by mouth 2 (two) times daily with a meal.  Dispense: 60 tablet; Refill: 5    Labs pending Health maintenance reviewed Diet and exercise encouraged Continue all meds Follow up  In 3 month   [place], FNP

## 2015-05-03 NOTE — Patient Instructions (Signed)
Exercise to Stay Healthy Exercise helps you become and stay healthy. EXERCISE IDEAS AND TIPS Choose exercises that:  You enjoy.  Fit into your day. You do not need to exercise really hard to be healthy. You can do exercises at a slow or medium level and stay healthy. You can:  Stretch before and after working out.  Try yoga, Pilates, or tai chi.  Lift weights.  Walk fast, swim, jog, run, climb stairs, bicycle, dance, or rollerskate.  Take aerobic classes. Exercises that burn about 150 calories:  Running 1  miles in 15 minutes.  Playing volleyball for 45 to 60 minutes.  Washing and waxing a car for 45 to 60 minutes.  Playing touch football for 45 minutes.  Walking 1  miles in 35 minutes.  Pushing a stroller 1  miles in 30 minutes.  Playing basketball for 30 minutes.  Raking leaves for 30 minutes.  Bicycling 5 miles in 30 minutes.  Walking 2 miles in 30 minutes.  Dancing for 30 minutes.  Shoveling snow for 15 minutes.  Swimming laps for 20 minutes.  Walking up stairs for 15 minutes.  Bicycling 4 miles in 15 minutes.  Gardening for 30 to 45 minutes.  Jumping rope for 15 minutes.  Washing windows or floors for 45 to 60 minutes. Document Released: 01/02/2011 Document Revised: 02/22/2012 Document Reviewed: 01/02/2011 Exit[employer] Patient Information 2015 [employer]. This information is not intended to replace advice given to you by your health care provider. Make sure you discuss any questions you have with your health care provider.  

## 2015-05-04 LAB — CMP14+EGFR
ALBUMIN: 4.8 g/dL (ref 3.5–5.5)
ALT: 25 IU/L (ref 0–44)
AST: 21 IU/L (ref 0–40)
Albumin/Globulin Ratio: 1.9 (ref 1.1–2.5)
Alkaline Phosphatase: 56 IU/L (ref 39–117)
BILIRUBIN TOTAL: 0.7 mg/dL (ref 0.0–1.2)
BUN/Creatinine Ratio: 12 (ref 9–20)
BUN: 11 mg/dL (ref 6–24)
CO2: 27 mmol/L (ref 18–29)
Calcium: 10.4 mg/dL — ABNORMAL HIGH (ref 8.7–10.2)
Chloride: 97 mmol/L (ref 97–108)
Creatinine, Ser: 0.93 mg/dL (ref 0.76–1.27)
GFR calc Af Amer: 107 mL/min/{1.73_m2} (ref >59)
GFR calc non Af Amer: 93 mL/min/{1.73_m2} (ref >59)
Globulin, Total: 2.5 g/dL (ref 1.5–4.5)
Glucose: 155 mg/dL — ABNORMAL HIGH (ref 65–99)
POTASSIUM: 4.9 mmol/L (ref 3.5–5.2)
SODIUM: 139 mmol/L (ref 134–144)
Total Protein: 7.3 g/dL (ref 6.0–8.5)

## 2015-05-04 LAB — NMR, LIPOPROFILE
CHOLESTEROL: 174 mg/dL (ref 100–199)
HDL Cholesterol by NMR: 47 mg/dL (ref >39)
HDL Particle Number: 35.3 umol/L (ref >=30.5)
LDL Particle Number: 1540 nmol/L — ABNORMAL HIGH (ref <1000)
LDL Size: 19.9 nm (ref >20.5)
LDL-C: 96 mg/dL (ref 0–99)
LP-IR SCORE: 70 — AB (ref <=45)
Small LDL Particle Number: 1079 nmol/L — ABNORMAL HIGH (ref <=527)
Triglycerides by NMR: 155 mg/dL — ABNORMAL HIGH (ref 0–149)

## 2015-07-21 ENCOUNTER — Other Ambulatory Visit: Payer: Self-pay | Admitting: Nurse Practitioner

## 2015-08-22 ENCOUNTER — Ambulatory Visit (INDEPENDENT_AMBULATORY_CARE_PROVIDER_SITE_OTHER): Payer: Commercial Managed Care - HMO | Admitting: Nurse Practitioner

## 2015-08-22 ENCOUNTER — Encounter: Payer: Self-pay | Admitting: Nurse Practitioner

## 2015-08-22 VITALS — BP 139/89 | HR 76 | Temp 97.3°F | Ht 69.0 in | Wt 189.0 lb

## 2015-08-22 DIAGNOSIS — E785 Hyperlipidemia, unspecified: Secondary | ICD-10-CM

## 2015-08-22 DIAGNOSIS — H3552 Pigmentary retinal dystrophy: Secondary | ICD-10-CM | POA: Diagnosis not present

## 2015-08-22 DIAGNOSIS — E1169 Type 2 diabetes mellitus with other specified complication: Secondary | ICD-10-CM | POA: Diagnosis not present

## 2015-08-22 DIAGNOSIS — E119 Type 2 diabetes mellitus without complications: Secondary | ICD-10-CM | POA: Diagnosis not present

## 2015-08-22 LAB — POCT GLYCOSYLATED HEMOGLOBIN (HGB A1C): HEMOGLOBIN A1C: 7.1

## 2015-08-22 MED ORDER — ROSUVASTATIN CALCIUM 20 MG PO TABS: 20.0000 mg | ORAL_TABLET | Freq: Every day | ORAL | Status: DC

## 2015-08-22 MED ORDER — METFORMIN HCL 1000 MG PO TABS: ORAL_TABLET | ORAL | Status: DC

## 2015-08-22 NOTE — Patient Instructions (Signed)
Fat and Cholesterol Control Diet Fat and cholesterol levels in your blood and organs are influenced by your diet. High levels of fat and cholesterol may lead to diseases of the heart, small and large blood vessels, gallbladder, liver, and pancreas. CONTROLLING FAT AND CHOLESTEROL WITH DIET Although exercise and lifestyle factors are important, your diet is key. That is because certain foods are known to raise cholesterol and others to lower it. The goal is to balance foods for their effect on cholesterol and more importantly, to replace saturated and trans fat with other types of fat, such as monounsaturated fat, polyunsaturated fat, and omega-3 fatty acids. On average, a person should consume no more than 15 to 17 g of saturated fat daily. Saturated and trans fats are considered "bad" fats, and they will raise LDL cholesterol. Saturated fats are primarily found in animal products such as meats, butter, and cream. However, that does not mean you need to give up all your favorite foods. Today, there are good tasting, low-fat, low-cholesterol substitutes for most of the things you like to eat. Choose low-fat or nonfat alternatives. Choose round or loin cuts of red meat. These types of cuts are lowest in fat and cholesterol. Chicken (without the skin), fish, veal, and ground turkey breast are great choices. Eliminate fatty meats, such as hot dogs and salami. Even shellfish have little or no saturated fat. Have a 3 oz (85 g) portion when you eat lean meat, poultry, or fish. Trans fats are also called "partially hydrogenated oils." They are oils that have been scientifically manipulated so that they are solid at room temperature resulting in a longer shelf life and improved taste and texture of foods in which they are added. Trans fats are found in stick margarine, some tub margarines, cookies, crackers, and baked goods.  When baking and cooking, oils are a great substitute for butter. The monounsaturated oils are  especially beneficial since it is believed they lower LDL and raise HDL. The oils you should avoid entirely are saturated tropical oils, such as coconut and palm.  Remember to eat a lot from food groups that are naturally free of saturated and trans fat, including fish, fruit, vegetables, beans, grains (barley, rice, couscous, bulgur wheat), and pasta (without cream sauces).  IDENTIFYING FOODS THAT LOWER FAT AND CHOLESTEROL  Soluble fiber may lower your cholesterol. This type of fiber is found in fruits such as apples, vegetables such as broccoli, potatoes, and carrots, legumes such as beans, peas, and lentils, and grains such as barley. Foods fortified with plant sterols (phytosterol) may also lower cholesterol. You should eat at least 2 g per day of these foods for a cholesterol lowering effect.  Read package labels to identify low-saturated fats, trans fat free, and low-fat foods at the supermarket. Select cheeses that have only 2 to 3 g saturated fat per ounce. Use a heart-healthy tub margarine that is free of trans fats or partially hydrogenated oil. When buying baked goods (cookies, crackers), avoid partially hydrogenated oils. Breads and muffins should be made from whole grains (whole-wheat or whole oat flour, instead of "flour" or "enriched flour"). Buy non-creamy canned soups with reduced salt and no added fats.  FOOD PREPARATION TECHNIQUES  Never deep-fry. If you must fry, either stir-fry, which uses very little fat, or use non-stick cooking sprays. When possible, broil, bake, or roast meats, and steam vegetables. Instead of putting butter or margarine on vegetables, use lemon and herbs, applesauce, and cinnamon (for squash and sweet potatoes). Use nonfat   yogurt, salsa, and low-fat dressings for salads.  LOW-SATURATED FAT / LOW-FAT FOOD SUBSTITUTES Meats / Saturated Fat (g)  Avoid: Steak, marbled (3 oz/85 g) / 11 g  Choose: Steak, lean (3 oz/85 g) / 4 g  Avoid: Hamburger (3 oz/85 g) / 7  g  Choose: Hamburger, lean (3 oz/85 g) / 5 g  Avoid: Ham (3 oz/85 g) / 6 g  Choose: Ham, lean cut (3 oz/85 g) / 2.4 g  Avoid: Chicken, with skin, dark meat (3 oz/85 g) / 4 g  Choose: Chicken, skin removed, dark meat (3 oz/85 g) / 2 g  Avoid: Chicken, with skin, light meat (3 oz/85 g) / 2.5 g  Choose: Chicken, skin removed, light meat (3 oz/85 g) / 1 g Dairy / Saturated Fat (g)  Avoid: Whole milk (1 cup) / 5 g  Choose: Low-fat milk, 2% (1 cup) / 3 g  Choose: Low-fat milk, 1% (1 cup) / 1.5 g  Choose: Skim milk (1 cup) / 0.3 g  Avoid: Hard cheese (1 oz/28 g) / 6 g  Choose: Skim milk cheese (1 oz/28 g) / 2 to 3 g  Avoid: Cottage cheese, 4% fat (1 cup) / 6.5 g  Choose: Low-fat cottage cheese, 1% fat (1 cup) / 1.5 g  Avoid: Ice cream (1 cup) / 9 g  Choose: Sherbet (1 cup) / 2.5 g  Choose: Nonfat frozen yogurt (1 cup) / 0.3 g  Choose: Frozen fruit bar / trace  Avoid: Whipped cream (1 tbs) / 3.5 g  Choose: Nondairy whipped topping (1 tbs) / 1 g Condiments / Saturated Fat (g)  Avoid: Mayonnaise (1 tbs) / 2 g  Choose: Low-fat mayonnaise (1 tbs) / 1 g  Avoid: Butter (1 tbs) / 7 g  Choose: Extra light margarine (1 tbs) / 1 g  Avoid: Coconut oil (1 tbs) / 11.8 g  Choose: Olive oil (1 tbs) / 1.8 g  Choose: Corn oil (1 tbs) / 1.7 g  Choose: Safflower oil (1 tbs) / 1.2 g  Choose: Sunflower oil (1 tbs) / 1.4 g  Choose: Soybean oil (1 tbs) / 2.4 g  Choose: Canola oil (1 tbs) / 1 g Document Released: 11/30/2005 Document Revised: 03/27/2013 Document Reviewed: 02/28/2014 Exit[employer] Patient Information 2015 [employer]. This information is not intended to replace advice given to you by your health care provider. Make sure you discuss any questions you have with your health care provider.  

## 2015-08-22 NOTE — Progress Notes (Signed)
 Subjective:    Patient ID: Patrick Valentine, male    DOB: 01/29/1961, 53 y.o.   MRN: [id number]   Patient here today for follow up of chronic medical problems.   Hyperlipidemia This is a chronic problem. The current episode started more than 1 year ago. The problem is uncontrolled. Recent lipid tests were reviewed and are high. Exacerbating diseases include diabetes. He has no history of hypothyroidism or obesity. Pertinent negatives include no chest pain. Current antihyperlipidemic treatment includes statins. Improvement on treatment: first time having labs drawn since going on meds. Compliance problems include adherence to diet and adherence to exercise.  Risk factors for coronary artery disease include diabetes mellitus, dyslipidemia and male sex.  Diabetes He presents for his follow-up diabetic visit. He has type 2 diabetes mellitus. No MedicAlert identification noted. His disease course has been improving. Pertinent negatives for diabetes include no chest pain. Symptoms are improving. Current diabetic treatment includes oral agent (monotherapy). He is compliant with treatment all of the time. His weight is stable. He is following a diabetic diet. He has not had a previous visit with a dietitian. He participates in exercise every other day. His home blood glucose trend is decreasing steadily. His breakfast blood glucose is taken between 8-9 am. His breakfast blood glucose range is generally 110-130 mg/dl. His overall blood glucose range is 110-130 mg/dl. An ACE inhibitor/angiotensin II receptor blocker is not being taken. He does not see a podiatrist.Eye exam is not current.      Review of Systems  Constitutional: Negative.   HENT: Negative.   Respiratory: Negative.   Cardiovascular: Negative for chest pain, palpitations and leg swelling.  Gastrointestinal: Negative.   Endocrine: Negative.   Genitourinary: Negative.   Neurological: Negative.   Hematological: Negative.     Psychiatric/Behavioral: Negative.   All other systems reviewed and are negative.      Objective:   Physical Exam  Constitutional: He is oriented to person, place, and time. He appears well-developed and well-nourished.  HENT:  Head: Normocephalic.  Right Ear: External ear normal.  Left Ear: External ear normal.  Nose: Nose normal.  Mouth/Throat: Oropharynx is clear and moist.  Eyes: EOM are normal. Pupils are equal, round, and reactive to light.  Neck: Normal range of motion. Neck supple. No JVD present. No thyromegaly present.  Cardiovascular: Normal rate, regular rhythm, normal heart sounds and intact distal pulses.  Exam reveals no gallop and no friction rub.   No murmur heard. Pulmonary/Chest: Effort normal and breath sounds normal. No respiratory distress. He has no wheezes. He has no rales. He exhibits no tenderness.  Abdominal: Soft. Bowel sounds are normal. He exhibits no mass. There is no tenderness.  Genitourinary: Prostate normal and penis normal.  Musculoskeletal: Normal range of motion. He exhibits no edema.  Lymphadenopathy:    He has no cervical adenopathy.  Neurological: He is alert and oriented to person, place, and time. No cranial nerve deficit.  Skin: Skin is warm and dry.  Psychiatric: He has a normal mood and affect. His behavior is normal. Judgment and thought content normal.   BP 139/89 mmHg  Pulse 76  Temp(Src) 97.3 F (36.3 C) (Oral)  Ht 5' 9" (1.753 m)  Wt 189 lb (85.73 kg)  BMI 27.90 kg/m2   Results for orders placed or performed in visit on 08/22/15  POCT glycosylated hemoglobin (Hb A1C)  Result Value Ref Range   Hemoglobin A1C 7.1              Assessment & Plan:   1. Type 2 diabetes mellitus without complication Low carb diet - POCT glycosylated hemoglobin (Hb A1C) - CMP14+EGFR - rosuvastatin (CRESTOR) 20 MG tablet; Take 1 tablet (20 mg total) by mouth daily.  Dispense: 30 tablet; Refill: 5  2. Hyperlipidemia associated with type 2  diabetes mellitus Low fat diet - Lipid panel  3. Retinitis pigmentosa of both eyes Keep appointments with eye doctor    Labs pending Health maintenance reviewed Diet and exercise encouraged Continue all meds Follow up  In 3 month   Mary-Margaret Martin, FNP    

## 2015-08-23 LAB — CMP14+EGFR
ALT: 22 IU/L (ref 0–44)
AST: 15 IU/L (ref 0–40)
Albumin/Globulin Ratio: 2 (ref 1.1–2.5)
Albumin: 4.5 g/dL (ref 3.5–5.5)
Alkaline Phosphatase: 54 IU/L (ref 39–117)
BUN/Creatinine Ratio: 13 (ref 9–20)
BUN: 13 mg/dL (ref 6–24)
Bilirubin Total: 0.4 mg/dL (ref 0.0–1.2)
CALCIUM: 9.7 mg/dL (ref 8.7–10.2)
CO2: 26 mmol/L (ref 18–29)
CREATININE: 0.99 mg/dL (ref 0.76–1.27)
Chloride: 97 mmol/L (ref 97–108)
GFR calc Af Amer: 99 mL/min/{1.73_m2} (ref >59)
GFR, EST NON AFRICAN AMERICAN: 86 mL/min/{1.73_m2} (ref >59)
Globulin, Total: 2.3 g/dL (ref 1.5–4.5)
Glucose: 140 mg/dL — ABNORMAL HIGH (ref 65–99)
POTASSIUM: 4.6 mmol/L (ref 3.5–5.2)
Sodium: 138 mmol/L (ref 134–144)
Total Protein: 6.8 g/dL (ref 6.0–8.5)

## 2015-08-23 LAB — LIPID PANEL
CHOL/HDL RATIO: 4 ratio (ref 0.0–5.0)
Cholesterol, Total: 178 mg/dL (ref 100–199)
HDL: 44 mg/dL (ref >39)
LDL CALC: 110 mg/dL — AB (ref 0–99)
TRIGLYCERIDES: 120 mg/dL (ref 0–149)
VLDL Cholesterol Cal: 24 mg/dL (ref 5–40)

## 2015-09-13 ENCOUNTER — Ambulatory Visit (INDEPENDENT_AMBULATORY_CARE_PROVIDER_SITE_OTHER): Payer: Commercial Managed Care - HMO | Admitting: *Deleted

## 2015-09-13 DIAGNOSIS — Z23 Encounter for immunization: Secondary | ICD-10-CM | POA: Diagnosis not present

## 2015-10-27 ENCOUNTER — Other Ambulatory Visit: Payer: Self-pay | Admitting: Nurse Practitioner

## 2015-11-01 ENCOUNTER — Telehealth: Payer: Self-pay | Admitting: *Deleted

## 2015-11-01 DIAGNOSIS — E119 Type 2 diabetes mellitus without complications: Secondary | ICD-10-CM

## 2015-11-01 NOTE — Telephone Encounter (Signed)
Left message for patient to return my call. Patient needs a microalbumin to close a gap in his care.  Future order placed.  He also needs to have a diabetic eye exam this year. If he has had one then we need to request the report.

## 2015-11-29 ENCOUNTER — Ambulatory Visit (INDEPENDENT_AMBULATORY_CARE_PROVIDER_SITE_OTHER): Payer: Commercial Managed Care - HMO | Admitting: Nurse Practitioner

## 2015-11-29 ENCOUNTER — Encounter: Payer: Self-pay | Admitting: Nurse Practitioner

## 2015-11-29 VITALS — BP 132/90 | HR 87 | Temp 97.0°F | Ht 69.0 in | Wt 189.0 lb

## 2015-11-29 DIAGNOSIS — H3552 Pigmentary retinal dystrophy: Secondary | ICD-10-CM

## 2015-11-29 DIAGNOSIS — E1169 Type 2 diabetes mellitus with other specified complication: Secondary | ICD-10-CM

## 2015-11-29 DIAGNOSIS — E785 Hyperlipidemia, unspecified: Secondary | ICD-10-CM

## 2015-11-29 DIAGNOSIS — E119 Type 2 diabetes mellitus without complications: Secondary | ICD-10-CM | POA: Diagnosis not present

## 2015-11-29 DIAGNOSIS — Z6827 Body mass index (BMI) 27.0-27.9, adult: Secondary | ICD-10-CM | POA: Diagnosis not present

## 2015-11-29 DIAGNOSIS — Z23 Encounter for immunization: Secondary | ICD-10-CM

## 2015-11-29 LAB — POCT GLYCOSYLATED HEMOGLOBIN (HGB A1C): HEMOGLOBIN A1C: 7.3

## 2015-11-29 MED ORDER — METFORMIN HCL 1000 MG PO TABS: ORAL_TABLET | ORAL | Status: DC

## 2015-11-29 MED ORDER — ROSUVASTATIN CALCIUM 20 MG PO TABS: 20.0000 mg | ORAL_TABLET | Freq: Every day | ORAL | Status: DC

## 2015-11-29 NOTE — Addendum Note (Signed)
Addended by: Rolena Infante on: 11/29/2015 12:04 PM   Modules accepted: Orders

## 2015-11-29 NOTE — Patient Instructions (Signed)
Diabetes and Foot Care Diabetes may cause you to have problems because of poor blood supply (circulation) to your feet and legs. This may cause the skin on your feet to become thinner, break easier, and heal more slowly. Your skin may become dry, and the skin may peel and crack. You may also have nerve damage in your legs and feet causing decreased feeling in them. You may not notice minor injuries to your feet that could lead to infections or more serious problems. Taking care of your feet is one of the most important things you can do for yourself.  HOME CARE INSTRUCTIONS  Wear shoes at all times, even in the house. Do not go barefoot. Bare feet are easily injured.  Check your feet daily for blisters, cuts, and redness. If you cannot see the bottom of your feet, use a mirror or ask someone for help.  Wash your feet with warm water (do not use hot water) and mild soap. Then pat your feet and the areas between your toes until they are completely dry. Do not soak your feet as this can dry your skin.  Apply a moisturizing lotion or petroleum jelly (that does not contain alcohol and is unscented) to the skin on your feet and to dry, brittle toenails. Do not apply lotion between your toes.  Trim your toenails straight across. Do not dig under them or around the cuticle. File the edges of your nails with an emery board or nail file.  Do not cut corns or calluses or try to remove them with medicine.  Wear clean socks or stockings every day. Make sure they are not too tight. Do not wear knee-high stockings since they may decrease blood flow to your legs.  Wear shoes that fit properly and have enough cushioning. To break in new shoes, wear them for just a few hours a day. This prevents you from injuring your feet. Always look in your shoes before you put them on to be sure there are no objects inside.  Do not cross your legs. This may decrease the blood flow to your feet.  If you find a minor scrape,  cut, or break in the skin on your feet, keep it and the skin around it clean and dry. These areas may be cleansed with mild soap and water. Do not cleanse the area with peroxide, alcohol, or iodine.  When you remove an adhesive bandage, be sure not to damage the skin around it.  If you have a wound, look at it several times a day to make sure it is healing.  Do not use heating pads or hot water bottles. They may burn your skin. If you have lost feeling in your feet or legs, you may not know it is happening until it is too late.  Make sure your health care provider performs a complete foot exam at least annually or more often if you have foot problems. Report any cuts, sores, or bruises to your health care provider immediately. SEEK MEDICAL CARE IF:   You have an injury that is not healing.  You have cuts or breaks in the skin.  You have an ingrown nail.  You notice redness on your legs or feet.  You feel burning or tingling in your legs or feet.  You have pain or cramps in your legs and feet.  Your legs or feet are numb.  Your feet always feel cold. SEEK IMMEDIATE MEDICAL CARE IF:   There is increasing redness,   swelling, or pain in or around a wound.  There is a red line that goes up your leg.  Pus is coming from a wound.  You develop a fever or as directed by your health care provider.  You notice a bad smell coming from an ulcer or wound.   This information is not intended to replace advice given to you by your health care provider. Make sure you discuss any questions you have with your health care provider.   Document Released: 11/27/2000 Document Revised: 08/02/2013 Document Reviewed: 05/09/2013 Elsevier Interactive Patient Education 2016 [employer]  

## 2015-11-29 NOTE — Progress Notes (Signed)
   Subjective:    Patient ID: Patrick Valentine, male    DOB: [birth year], 54 y.o.   MRN: [id number]  HPI    Review of Systems     Objective:   Physical Exam        Assessment & Plan:

## 2015-11-29 NOTE — Progress Notes (Signed)
Subjective:    Patient ID: Patrick Valentine, male    DOB: 1961-11-21, 54 y.o.   MRN: [id number]   Patient here today for follow up of chronic medical problems.   Hyperlipidemia This is a chronic problem. The current episode started more than 1 year ago. The problem is uncontrolled. Recent lipid tests were reviewed and are high. Exacerbating diseases include diabetes. He has no history of hypothyroidism or obesity. Pertinent negatives include no chest pain. Current antihyperlipidemic treatment includes statins. Improvement on treatment: first time having labs drawn since going on meds. Compliance problems include adherence to diet and adherence to exercise.  Risk factors for coronary artery disease include diabetes mellitus, dyslipidemia and male sex.  Diabetes He presents for his follow-up diabetic visit. He has type 2 diabetes mellitus. No MedicAlert identification noted. His disease course has been improving. Pertinent negatives for diabetes include no chest pain. Symptoms are improving. Current diabetic treatment includes oral agent (monotherapy). He is compliant with treatment all of the time. His weight is stable. He is following a diabetic diet. He has not had a previous visit with a dietitian. He participates in exercise every other day. His home blood glucose trend is decreasing steadily. His breakfast blood glucose is taken between 8-9 am. His breakfast blood glucose range is generally 110-130 mg/dl. His overall blood glucose range is 110-130 mg/dl. An ACE inhibitor/angiotensin II receptor blocker is not being taken. He does not see a podiatrist.Eye exam is not current.   retinitis pigmentosis Vision is gradually worsening- he is eye doctor every 3 months.  Review of Systems  Constitutional: Negative.   HENT: Negative.   Respiratory: Negative.   Cardiovascular: Negative for chest pain, palpitations and leg swelling.  Gastrointestinal: Negative.   Endocrine: Negative.   Genitourinary:  Negative.   Neurological: Negative.   Hematological: Negative.   Psychiatric/Behavioral: Negative.   All other systems reviewed and are negative.      Objective:   Physical Exam  Constitutional: He is oriented to person, place, and time. He appears well-developed and well-nourished.  HENT:  Head: Normocephalic.  Right Ear: External ear normal.  Left Ear: External ear normal.  Nose: Nose normal.  Mouth/Throat: Oropharynx is clear and moist.  Eyes: EOM are normal. Pupils are equal, round, and reactive to light.  Neck: Normal range of motion. Neck supple. No JVD present. No thyromegaly present.  Cardiovascular: Normal rate, regular rhythm, normal heart sounds and intact distal pulses.  Exam reveals no gallop and no friction rub.   No murmur heard. Pulmonary/Chest: Effort normal and breath sounds normal. No respiratory distress. He has no wheezes. He has no rales. He exhibits no tenderness.  Abdominal: Soft. Bowel sounds are normal. He exhibits no mass. There is no tenderness.  Genitourinary: Prostate normal and penis normal.  Musculoskeletal: Normal range of motion. He exhibits no edema.  Lymphadenopathy:    He has no cervical adenopathy.  Neurological: He is alert and oriented to person, place, and time. No cranial nerve deficit.  Skin: Skin is warm and dry.  Psychiatric: He has a normal mood and affect. His behavior is normal. Judgment and thought content normal.   BP 132/90 mmHg  Pulse 87  Temp(Src) 97 F (36.1 C) (Oral)  Ht 5' 9"  (1.753 m)  Wt 189 lb (85.73 kg)  BMI 27.90 kg/m2   Results for orders placed or performed in visit on 11/29/15  POCT glycosylated hemoglobin (Hb A1C)  Result Value Ref Range   Hemoglobin A1C 7.3  Assessment & Plan:   1. Type 2 diabetes mellitus without complication Low carb diet - POCT glycosylated hemoglobin (Hb A1C) - CMP14+EGFR - rosuvastatin (CRESTOR) 20 MG tablet; Take 1 tablet (20 mg total) by mouth daily.  Dispense:  30 tablet; Refill: 5  2. Hyperlipidemia associated with type 2 diabetes mellitus Low fat diet - Lipid panel  3. Retinitis pigmentosa of both eyes Keep appointments with eye doctor    Labs pending Health maintenance reviewed Diet and exercise encouraged Continue all meds Follow up  In 3 month   [place], FNP

## 2015-11-30 LAB — CMP14+EGFR
ALBUMIN: 4.3 g/dL (ref 3.5–5.5)
ALK PHOS: 53 IU/L (ref 39–117)
ALT: 23 IU/L (ref 0–44)
AST: 16 IU/L (ref 0–40)
Albumin/Globulin Ratio: 1.8 (ref 1.1–2.5)
BILIRUBIN TOTAL: 0.3 mg/dL (ref 0.0–1.2)
BUN/Creatinine Ratio: 13 (ref 9–20)
BUN: 11 mg/dL (ref 6–24)
CHLORIDE: 94 mmol/L — AB (ref 96–106)
CO2: 26 mmol/L (ref 18–29)
CREATININE: 0.85 mg/dL (ref 0.76–1.27)
Calcium: 9.7 mg/dL (ref 8.7–10.2)
GFR calc Af Amer: 113 mL/min/{1.73_m2} (ref >59)
GFR calc non Af Amer: 98 mL/min/{1.73_m2} (ref >59)
GLUCOSE: 183 mg/dL — AB (ref 65–99)
Globulin, Total: 2.4 g/dL (ref 1.5–4.5)
Potassium: 4.5 mmol/L (ref 3.5–5.2)
Sodium: 137 mmol/L (ref 134–144)
Total Protein: 6.7 g/dL (ref 6.0–8.5)

## 2015-11-30 LAB — LIPID PANEL
CHOLESTEROL TOTAL: 164 mg/dL (ref 100–199)
Chol/HDL Ratio: 3.7 ratio units (ref 0.0–5.0)
HDL: 44 mg/dL (ref >39)
LDL CALC: 104 mg/dL — AB (ref 0–99)
TRIGLYCERIDES: 79 mg/dL (ref 0–149)
VLDL CHOLESTEROL CAL: 16 mg/dL (ref 5–40)

## 2015-11-30 LAB — MICROALBUMIN / CREATININE URINE RATIO
CREATININE, UR: 34.6 mg/dL
MICROALB/CREAT RATIO: 9.2 mg/g{creat} (ref 0.0–30.0)
Microalbumin, Urine: 3.2 ug/mL

## 2016-02-19 ENCOUNTER — Other Ambulatory Visit: Payer: Self-pay | Admitting: Nurse Practitioner

## 2016-02-27 ENCOUNTER — Encounter: Payer: Self-pay | Admitting: Nurse Practitioner

## 2016-02-27 ENCOUNTER — Ambulatory Visit (INDEPENDENT_AMBULATORY_CARE_PROVIDER_SITE_OTHER): Payer: Commercial Managed Care - HMO | Admitting: Nurse Practitioner

## 2016-02-27 VITALS — BP 131/89 | HR 90 | Temp 96.9°F | Ht 69.0 in | Wt 188.0 lb

## 2016-02-27 DIAGNOSIS — E1169 Type 2 diabetes mellitus with other specified complication: Secondary | ICD-10-CM

## 2016-02-27 DIAGNOSIS — E785 Hyperlipidemia, unspecified: Secondary | ICD-10-CM

## 2016-02-27 DIAGNOSIS — E119 Type 2 diabetes mellitus without complications: Secondary | ICD-10-CM | POA: Diagnosis not present

## 2016-02-27 DIAGNOSIS — H3552 Pigmentary retinal dystrophy: Secondary | ICD-10-CM

## 2016-02-27 DIAGNOSIS — Z1159 Encounter for screening for other viral diseases: Secondary | ICD-10-CM | POA: Diagnosis not present

## 2016-02-27 DIAGNOSIS — Z1212 Encounter for screening for malignant neoplasm of rectum: Secondary | ICD-10-CM | POA: Diagnosis not present

## 2016-02-27 LAB — BAYER DCA HB A1C WAIVED: HB A1C: 7.4 % — AB (ref <7.0)

## 2016-02-27 NOTE — Progress Notes (Signed)
Subjective:    Patient ID: Patrick Valentine, male    DOB: [birth year], 55 y.o.   MRN: [id number]   Patient here today for follow up of chronic medical problems.  Outpatient Encounter Prescriptions as of 02/27/2016  Medication Sig  . glucose blood (ONETOUCH VERIO) test strip Use to check blood glucose once a day.  Dx:  250.02  . metFORMIN (GLUCOPHAGE) 1000 MG tablet TAKE 1 TABLET (1,000 MG TOTAL) BY MOUTH 2 (TWO) TIMES DAILY WITH A MEA L.  . ONETOUCH DELICA LANCETS FINE MISC Use to check BG once daily.  Dx:  250.02  . rosuvastatin (CRESTOR) 20 MG tablet TAKE ONE TABLET BY MOUTH ONE TIME DAILY   No facility-administered encounter medications on file as of 02/27/2016.     Hyperlipidemia This is a chronic problem. The current episode started more than 1 year ago. The problem is uncontrolled. Recent lipid tests were reviewed and are high. Exacerbating diseases include diabetes. He has no history of hypothyroidism or obesity. Pertinent negatives include no chest pain. Current antihyperlipidemic treatment includes statins. Improvement on treatment: first time having labs drawn since going on meds. Compliance problems include adherence to diet and adherence to exercise.  Risk factors for coronary artery disease include diabetes mellitus, dyslipidemia and male sex.  Diabetes He presents for his follow-up diabetic visit. He has type 2 diabetes mellitus. No MedicAlert identification noted. His disease course has been improving. Pertinent negatives for diabetes include no chest pain. Symptoms are improving. Current diabetic treatment includes oral agent (monotherapy). He is compliant with treatment all of the time. His weight is stable. He is following a diabetic diet. He has not had a previous visit with a dietitian. He participates in exercise every other day. His home blood glucose trend is decreasing steadily. His breakfast blood glucose is taken between 8-9 am. His breakfast blood glucose range is  generally 110-130 mg/dl. His overall blood glucose range is 110-130 mg/dl. An ACE inhibitor/angiotensin II receptor blocker is not being taken. He does not see a podiatrist.Eye exam is not current.   retinitis pigmentosis Vision is gradually worsening- he is eye doctor every 3 months.  Review of Systems  Constitutional: Negative.   HENT: Negative.   Respiratory: Negative.   Cardiovascular: Negative for chest pain, palpitations and leg swelling.  Gastrointestinal: Negative.   Endocrine: Negative.   Genitourinary: Negative.   Neurological: Negative.   Hematological: Negative.   Psychiatric/Behavioral: Negative.   All other systems reviewed and are negative.      Objective:   Physical Exam  Constitutional: He is oriented to person, place, and time. He appears well-developed and well-nourished.  HENT:  Head: Normocephalic.  Right Ear: External ear normal.  Left Ear: External ear normal.  Nose: Nose normal.  Mouth/Throat: Oropharynx is clear and moist.  Eyes: EOM are normal. Pupils are equal, round, and reactive to light.  Neck: Normal range of motion. Neck supple. No JVD present. No thyromegaly present.  Cardiovascular: Normal rate, regular rhythm, normal heart sounds and intact distal pulses.  Exam reveals no gallop and no friction rub.   No murmur heard. Pulmonary/Chest: Effort normal and breath sounds normal. No respiratory distress. He has no wheezes. He has no rales. He exhibits no tenderness.  Abdominal: Soft. Bowel sounds are normal. He exhibits no mass. There is no tenderness.  Genitourinary: Prostate normal and penis normal.  Musculoskeletal: Normal range of motion. He exhibits no edema.  Lymphadenopathy:    He has no cervical adenopathy.  Neurological: He  is alert and oriented to person, place, and time. No cranial nerve deficit.  Skin: Skin is warm and dry.  Psychiatric: He has a normal mood and affect. His behavior is normal. Judgment and thought content normal.     BP 131/89 mmHg  Pulse 90  Temp(Src) 96.9 F (36.1 C) (Oral)  Ht 5' 9"  (1.753 m)  Wt 188 lb (85.276 kg)  BMI 27.75 kg/m2      Assessment & Plan:  1. Type 2 diabetes mellitus not at goal [facility]) Continue carb counting - Bayer DCA Hb A1c Waived - CMP14+EGFR  2. Retinitis pigmentosa of both eyes Keep follow up with eye doctor  3. Hyperlipidemia associated with type 2 diabetes mellitus (HCC) Low fat diet - Lipid panel  4. Need for hepatitis C screening test - Hepatitis C antibody  5. Screening for malignant neoplasm of the rectum - Fecal occult blood, imunochemical; Future    Labs pending Health maintenance reviewed Diet and exercise encouraged Continue all meds Follow up  In 3 month   [place], FNP

## 2016-02-28 LAB — CMP14+EGFR
ALK PHOS: 67 IU/L (ref 39–117)
ALT: 27 IU/L (ref 0–44)
AST: 19 IU/L (ref 0–40)
Albumin/Globulin Ratio: 1.9 (ref 1.2–2.2)
Albumin: 4.7 g/dL (ref 3.5–5.5)
BILIRUBIN TOTAL: 0.5 mg/dL (ref 0.0–1.2)
BUN/Creatinine Ratio: 11 (ref 9–20)
BUN: 11 mg/dL (ref 6–24)
CHLORIDE: 97 mmol/L (ref 96–106)
CO2: 24 mmol/L (ref 18–29)
Calcium: 9.9 mg/dL (ref 8.7–10.2)
Creatinine, Ser: 0.99 mg/dL (ref 0.76–1.27)
GFR calc Af Amer: 99 mL/min/{1.73_m2} (ref >59)
GFR calc non Af Amer: 85 mL/min/{1.73_m2} (ref >59)
GLUCOSE: 180 mg/dL — AB (ref 65–99)
Globulin, Total: 2.5 g/dL (ref 1.5–4.5)
Potassium: 5 mmol/L (ref 3.5–5.2)
Sodium: 137 mmol/L (ref 134–144)
TOTAL PROTEIN: 7.2 g/dL (ref 6.0–8.5)

## 2016-02-28 LAB — LIPID PANEL
CHOLESTEROL TOTAL: 209 mg/dL — AB (ref 100–199)
Chol/HDL Ratio: 5.2 ratio units — ABNORMAL HIGH (ref 0.0–5.0)
HDL: 40 mg/dL (ref >39)
LDL Calculated: 124 mg/dL — ABNORMAL HIGH (ref 0–99)
Triglycerides: 224 mg/dL — ABNORMAL HIGH (ref 0–149)
VLDL CHOLESTEROL CAL: 45 mg/dL — AB (ref 5–40)

## 2016-02-29 LAB — HEPATITIS C ANTIBODY: Hep C Virus Ab: 0.1 s/co ratio (ref 0.0–0.9)

## 2016-02-29 LAB — SPECIMEN STATUS REPORT

## 2016-03-23 ENCOUNTER — Telehealth: Payer: Self-pay | Admitting: *Deleted

## 2016-04-19 ENCOUNTER — Other Ambulatory Visit: Payer: Self-pay | Admitting: Nurse Practitioner

## 2016-05-01 NOTE — Telephone Encounter (Signed)
Erroneous encounter

## 2016-06-08 ENCOUNTER — Ambulatory Visit (INDEPENDENT_AMBULATORY_CARE_PROVIDER_SITE_OTHER): Payer: Commercial Managed Care - HMO | Admitting: Nurse Practitioner

## 2016-06-08 ENCOUNTER — Encounter: Payer: Self-pay | Admitting: Nurse Practitioner

## 2016-06-08 VITALS — BP 138/89 | HR 78 | Temp 97.2°F | Ht 69.0 in | Wt 188.0 lb

## 2016-06-08 DIAGNOSIS — E119 Type 2 diabetes mellitus without complications: Secondary | ICD-10-CM

## 2016-06-08 DIAGNOSIS — H3552 Pigmentary retinal dystrophy: Secondary | ICD-10-CM

## 2016-06-08 DIAGNOSIS — E1169 Type 2 diabetes mellitus with other specified complication: Secondary | ICD-10-CM

## 2016-06-08 DIAGNOSIS — E785 Hyperlipidemia, unspecified: Secondary | ICD-10-CM | POA: Diagnosis not present

## 2016-06-08 DIAGNOSIS — Z1212 Encounter for screening for malignant neoplasm of rectum: Secondary | ICD-10-CM | POA: Diagnosis not present

## 2016-06-08 LAB — BAYER DCA HB A1C WAIVED: HB A1C: 8 % — AB (ref <7.0)

## 2016-06-08 MED ORDER — METFORMIN HCL 1000 MG PO TABS: 1000.0000 mg | ORAL_TABLET | Freq: Two times a day (BID) | ORAL | Status: DC

## 2016-06-08 MED ORDER — ROSUVASTATIN CALCIUM 20 MG PO TABS: 20.0000 mg | ORAL_TABLET | Freq: Every day | ORAL | Status: DC

## 2016-06-08 NOTE — Progress Notes (Signed)
Subjective:    Patient ID: Patrick Valentine, male    DOB: 09-02-1961, 55 y.o.   MRN: [id number]   Patient here today for follow up of chronic medical problems.  Outpatient Encounter Prescriptions as of 06/08/2016  Medication Sig  . glucose blood (ONETOUCH VERIO) test strip Use to check blood glucose once a day.  Dx:  250.02  . metFORMIN (GLUCOPHAGE) 1000 MG tablet TAKE 1 TABLET (1,000 MG TOTAL) BY MOUTH 2 (TWO) TIMES DAILY WITH A MEA L.  . ONETOUCH DELICA LANCETS FINE MISC Use to check BG once daily.  Dx:  250.02  . rosuvastatin (CRESTOR) 20 MG tablet TAKE ONE TABLET BY MOUTH ONE TIME DAILY   No facility-administered encounter medications on file as of 06/08/2016.     Hyperlipidemia This is a chronic problem. The current episode started more than 1 year ago. The problem is uncontrolled. Recent lipid tests were reviewed and are high. Exacerbating diseases include diabetes. He has no history of hypothyroidism or obesity. Pertinent negatives include no chest pain. Current antihyperlipidemic treatment includes statins. Improvement on treatment: first time having labs drawn since going on meds. Compliance problems include adherence to diet and adherence to exercise.  Risk factors for coronary artery disease include diabetes mellitus, dyslipidemia and male sex.  Diabetes He presents for his follow-up diabetic visit. He has type 2 diabetes mellitus. No MedicAlert identification noted. His disease course has been improving. Pertinent negatives for diabetes include no chest pain. Symptoms are improving. Current diabetic treatment includes oral agent (monotherapy). He is compliant with treatment all of the time. His weight is stable. He is following a diabetic diet. He has not had a previous visit with a dietitian. He participates in exercise every other day. His home blood glucose trend is decreasing steadily. His breakfast blood glucose is taken between 8-9 am. His breakfast blood glucose range is  generally 110-130 mg/dl. His overall blood glucose range is 110-130 mg/dl. An ACE inhibitor/angiotensin II receptor blocker is not being taken. He does not see a podiatrist.Eye exam is not current.   retinitis pigmentosis Vision is gradually worsening- he has not seen eye doctor in awhile- will make appointment to see one at the end of week.  Review of Systems  Constitutional: Negative.   HENT: Negative.   Respiratory: Negative.   Cardiovascular: Negative for chest pain, palpitations and leg swelling.  Gastrointestinal: Negative.   Endocrine: Negative.   Genitourinary: Negative.   Neurological: Negative.   Hematological: Negative.   Psychiatric/Behavioral: Negative.   All other systems reviewed and are negative.      Objective:   Physical Exam  Constitutional: He is oriented to person, place, and time. He appears well-developed and well-nourished.  HENT:  Head: Normocephalic.  Right Ear: External ear normal.  Left Ear: External ear normal.  Nose: Nose normal.  Mouth/Throat: Oropharynx is clear and moist.  Eyes: EOM are normal. Pupils are equal, round, and reactive to light.  Neck: Normal range of motion. Neck supple. No JVD present. No thyromegaly present.  Cardiovascular: Normal rate, regular rhythm, normal heart sounds and intact distal pulses.  Exam reveals no gallop and no friction rub.   No murmur heard. Pulmonary/Chest: Effort normal and breath sounds normal. No respiratory distress. He has no wheezes. He has no rales. He exhibits no tenderness.  Abdominal: Soft. Bowel sounds are normal. He exhibits no mass. There is no tenderness.  Genitourinary: Prostate normal and penis normal.  Musculoskeletal: Normal range of motion. He exhibits no edema.  Lymphadenopathy:    He has no cervical adenopathy.  Neurological: He is alert and oriented to person, place, and time. No cranial nerve deficit.  Skin: Skin is warm and dry.  Psychiatric: He has a normal mood and affect. His  behavior is normal. Judgment and thought content normal.    BP 138/89 mmHg  Pulse 78  Temp(Src) 97.2 F (36.2 C) (Oral)  Ht 5' 9"  (1.753 m)  Wt 188 lb (85.276 kg)  BMI 27.75 kg/m2  hgba1c 8.0% up from 7.4% at last visit    Assessment & Plan:  1. Type 2 diabetes mellitus not at goal [facility]) Stricter carb counting Patient does not want to add meds at this time- wants to try exercise - Bayer DCA Hb A1c Waived - CMP14+EGFR - metFORMIN (GLUCOPHAGE) 1000 MG tablet; Take 1 tablet (1,000 mg total) by mouth 2 (two) times daily with a meal.  Dispense: 180 tablet; Refill: 1  2. Hyperlipidemia associated with type 2 diabetes mellitus (HCC) Low fat diet - CMP14+EGFR - Lipid panel - rosuvastatin (CRESTOR) 20 MG tablet; Take 1 tablet (20 mg total) by mouth daily.  Dispense: 90 tablet; Refill: 1  3. Screening for malignant neoplasm of the rectum - Fecal occult blood, imunochemical  4. Retinitis pigmentosa of both eyes Needs to make appointment to see eye doctor    Labs pending Health maintenance reviewed Diet and exercise encouraged Continue all meds Follow up  In 3  month   [place], FNP

## 2016-06-08 NOTE — Patient Instructions (Signed)

## 2016-06-09 LAB — CMP14+EGFR
ALT: 24 IU/L (ref 0–44)
AST: 17 IU/L (ref 0–40)
Albumin/Globulin Ratio: 1.7 (ref 1.2–2.2)
Albumin: 4.5 g/dL (ref 3.5–5.5)
Alkaline Phosphatase: 54 IU/L (ref 39–117)
BUN/Creatinine Ratio: 13 (ref 9–20)
BUN: 12 mg/dL (ref 6–24)
Bilirubin Total: 0.4 mg/dL (ref 0.0–1.2)
CO2: 24 mmol/L (ref 18–29)
Calcium: 9.5 mg/dL (ref 8.7–10.2)
Chloride: 97 mmol/L (ref 96–106)
Creatinine, Ser: 0.91 mg/dL (ref 0.76–1.27)
GFR calc Af Amer: 109 mL/min/{1.73_m2} (ref >59)
GFR calc non Af Amer: 95 mL/min/{1.73_m2} (ref >59)
Globulin, Total: 2.6 g/dL (ref 1.5–4.5)
Glucose: 187 mg/dL — ABNORMAL HIGH (ref 65–99)
Potassium: 4.7 mmol/L (ref 3.5–5.2)
Sodium: 137 mmol/L (ref 134–144)
Total Protein: 7.1 g/dL (ref 6.0–8.5)

## 2016-06-09 LAB — LIPID PANEL
Chol/HDL Ratio: 4 ratio units (ref 0.0–5.0)
Cholesterol, Total: 199 mg/dL (ref 100–199)
HDL: 50 mg/dL (ref >39)
LDL Calculated: 122 mg/dL — ABNORMAL HIGH (ref 0–99)
TRIGLYCERIDES: 136 mg/dL (ref 0–149)
VLDL CHOLESTEROL CAL: 27 mg/dL (ref 5–40)

## 2016-06-11 LAB — FECAL OCCULT BLOOD, IMMUNOCHEMICAL: FECAL OCCULT BLD: NEGATIVE

## 2016-06-12 DIAGNOSIS — Z0100 Encounter for examination of eyes and vision without abnormal findings: Secondary | ICD-10-CM | POA: Diagnosis not present

## 2016-06-12 DIAGNOSIS — E119 Type 2 diabetes mellitus without complications: Secondary | ICD-10-CM | POA: Diagnosis not present

## 2016-06-12 DIAGNOSIS — H40033 Anatomical narrow angle, bilateral: Secondary | ICD-10-CM | POA: Diagnosis not present

## 2016-06-12 LAB — HM DIABETES EYE EXAM

## 2016-06-18 ENCOUNTER — Other Ambulatory Visit: Payer: Self-pay | Admitting: *Deleted

## 2016-06-18 DIAGNOSIS — E119 Type 2 diabetes mellitus without complications: Secondary | ICD-10-CM

## 2016-06-18 MED ORDER — GLUCOSE BLOOD VI STRP: ORAL_STRIP | Status: DC

## 2016-06-20 DIAGNOSIS — E119 Type 2 diabetes mellitus without complications: Secondary | ICD-10-CM | POA: Diagnosis not present

## 2016-06-26 ENCOUNTER — Other Ambulatory Visit: Payer: Self-pay | Admitting: Nurse Practitioner

## 2016-07-01 ENCOUNTER — Telehealth: Payer: Self-pay | Admitting: Nurse Practitioner

## 2016-07-01 ENCOUNTER — Other Ambulatory Visit: Payer: Self-pay | Admitting: Nurse Practitioner

## 2016-07-01 MED ORDER — GLUCOSE BLOOD VI STRP: ORAL_STRIP | Status: DC

## 2016-07-01 MED ORDER — ACCU-CHEK SOFT TOUCH LANCETS MISC: Status: DC

## 2016-07-01 MED ORDER — ACCU-CHEK NANO SMARTVIEW W/DEVICE KIT: PACK | Status: DC

## 2016-07-01 NOTE — Telephone Encounter (Signed)
Done, disregard MMM

## 2016-07-01 NOTE — Telephone Encounter (Signed)
seen 05/2016 Needs these sent in for new machine - accu-chek nano

## 2016-07-01 NOTE — Telephone Encounter (Signed)
This was done today 

## 2016-07-02 ENCOUNTER — Other Ambulatory Visit: Payer: Self-pay | Admitting: *Deleted

## 2016-07-02 DIAGNOSIS — E785 Hyperlipidemia, unspecified: Principal | ICD-10-CM

## 2016-07-02 DIAGNOSIS — E1169 Type 2 diabetes mellitus with other specified complication: Secondary | ICD-10-CM

## 2016-07-02 DIAGNOSIS — E119 Type 2 diabetes mellitus without complications: Secondary | ICD-10-CM

## 2016-07-02 MED ORDER — METFORMIN HCL 1000 MG PO TABS
1000.0000 mg | ORAL_TABLET | Freq: Two times a day (BID) | ORAL | Status: DC
Start: 2016-07-02 — End: 2016-09-28

## 2016-07-02 MED ORDER — ROSUVASTATIN CALCIUM 20 MG PO TABS: 20.0000 mg | ORAL_TABLET | Freq: Every day | ORAL | Status: DC

## 2016-07-02 NOTE — Telephone Encounter (Signed)
All filled, LMOM

## 2016-09-08 ENCOUNTER — Ambulatory Visit (INDEPENDENT_AMBULATORY_CARE_PROVIDER_SITE_OTHER): Payer: Commercial Managed Care - HMO | Admitting: Nurse Practitioner

## 2016-09-08 ENCOUNTER — Encounter: Payer: Self-pay | Admitting: Nurse Practitioner

## 2016-09-08 VITALS — BP 126/83 | HR 75 | Temp 97.1°F | Ht 69.0 in | Wt 183.0 lb

## 2016-09-08 DIAGNOSIS — Z23 Encounter for immunization: Secondary | ICD-10-CM

## 2016-09-08 DIAGNOSIS — Z6827 Body mass index (BMI) 27.0-27.9, adult: Secondary | ICD-10-CM

## 2016-09-08 DIAGNOSIS — E1169 Type 2 diabetes mellitus with other specified complication: Secondary | ICD-10-CM | POA: Diagnosis not present

## 2016-09-08 DIAGNOSIS — H3552 Pigmentary retinal dystrophy: Secondary | ICD-10-CM

## 2016-09-08 DIAGNOSIS — E785 Hyperlipidemia, unspecified: Secondary | ICD-10-CM | POA: Diagnosis not present

## 2016-09-08 DIAGNOSIS — E119 Type 2 diabetes mellitus without complications: Secondary | ICD-10-CM

## 2016-09-08 DIAGNOSIS — Z6826 Body mass index (BMI) 26.0-26.9, adult: Secondary | ICD-10-CM | POA: Insufficient documentation

## 2016-09-08 LAB — BAYER DCA HB A1C WAIVED: HB A1C (BAYER DCA - WAIVED): 7.6 % — ABNORMAL HIGH (ref <7.0)

## 2016-09-08 NOTE — Progress Notes (Signed)
Subjective:    Patient ID: Patrick Valentine, male    DOB: 23-Dec-1960, 55 y.o.   MRN: [id number]   Patient here today for follow up of chronic medical problems. Lats visit hgba1c had increased to 8.0%- No changes were made to medication because patient wanted to try diet and exercise.  Outpatient Encounter Prescriptions as of 09/08/2016  Medication Sig  . Blood Glucose Monitoring Suppl (ACCU-CHEK NANO SMARTVIEW) w/Device KIT Use to test BG bid. DX: E11.9  . glucose blood (ACCU-CHEK SMARTVIEW) test strip Test Bid. DX:E11.9  . Lancets (ACCU-CHEK SOFT TOUCH) lancets Test Bid. DXE11.9  . metFORMIN (GLUCOPHAGE) 1000 MG tablet Take 1 tablet (1,000 mg total) by mouth 2 (two) times daily with a meal.  . rosuvastatin (CRESTOR) 20 MG tablet Take 1 tablet (20 mg total) by mouth daily.   No facility-administered encounter medications on file as of 09/08/2016.      Hyperlipidemia  This is a chronic problem. The current episode started more than 1 year ago. The problem is uncontrolled. Recent lipid tests were reviewed and are high. Exacerbating diseases include diabetes. He has no history of hypothyroidism or obesity. Pertinent negatives include no chest pain. Current antihyperlipidemic treatment includes statins. Improvement on treatment: first time having labs drawn since going on meds. Compliance problems include adherence to diet and adherence to exercise.  Risk factors for coronary artery disease include diabetes mellitus, dyslipidemia and male sex.  Diabetes  He presents for his follow-up diabetic visit. He has type 2 diabetes mellitus. No MedicAlert identification noted. His disease course has been improving. There are no hypoglycemic associated symptoms. Pertinent negatives for diabetes include no chest pain. There are no hypoglycemic complications. Symptoms are improving. There are no diabetic complications. Pertinent negatives for diabetic complications include no CVA, heart disease, impotence or  retinopathy. Risk factors for coronary artery disease include diabetes mellitus, dyslipidemia, hypertension and male sex. Current diabetic treatment includes oral agent (monotherapy). He is compliant with treatment all of the time. His weight is stable. He is following a diabetic diet. He has not had a previous visit with a dietitian. He participates in exercise every other day. His home blood glucose trend is decreasing steadily. His breakfast blood glucose is taken between 8-9 am. His breakfast blood glucose range is generally 110-130 mg/dl. His highest blood glucose is 140-180 mg/dl. His overall blood glucose range is 110-130 mg/dl. An ACE inhibitor/angiotensin II receptor blocker is not being taken. He does not see a podiatrist.Eye exam is not current.   retinitis pigmentosis Vision is gradually worsening- he has not seen eye doctor in awhile- will make appointment to see one at the end of week.  Review of Systems  Constitutional: Negative.   HENT: Negative.   Respiratory: Negative.   Cardiovascular: Negative for chest pain, palpitations and leg swelling.  Gastrointestinal: Negative.   Endocrine: Negative.   Genitourinary: Negative.  Negative for impotence.  Neurological: Negative.   Hematological: Negative.   Psychiatric/Behavioral: Negative.   All other systems reviewed and are negative.      Objective:   Physical Exam  Constitutional: He is oriented to person, place, and time. He appears well-developed and well-nourished.  HENT:  Head: Normocephalic.  Right Ear: External ear normal.  Left Ear: External ear normal.  Nose: Nose normal.  Mouth/Throat: Oropharynx is clear and moist.  Eyes: EOM are normal. Pupils are equal, round, and reactive to light.  Neck: Normal range of motion. Neck supple. No JVD present. No thyromegaly present.  Cardiovascular: Normal  rate, regular rhythm, normal heart sounds and intact distal pulses.  Exam reveals no gallop and no friction rub.   No murmur  heard. Pulmonary/Chest: Effort normal and breath sounds normal. No respiratory distress. He has no wheezes. He has no rales. He exhibits no tenderness.  Abdominal: Soft. Bowel sounds are normal. He exhibits no mass. There is no tenderness.  Genitourinary: Prostate normal and penis normal.  Musculoskeletal: Normal range of motion. He exhibits no edema.  Lymphadenopathy:    He has no cervical adenopathy.  Neurological: He is alert and oriented to person, place, and time. No cranial nerve deficit.  Skin: Skin is warm and dry.  Psychiatric: He has a normal mood and affect. His behavior is normal. Judgment and thought content normal.   BP 126/83   Pulse 75   Temp 97.1 F (36.2 C) (Oral)   Ht 5' 9" (1.753 m)   Wt 183 lb (83 kg)   BMI 27.02 kg/m    hgba1c 7.6% up from 8.0% at last visit    Assessment & Plan:  1. Type 2 diabetes mellitus not at goal [facility]) Continue to watch carbs in diet- hgba1c still needs to come down some. - Bayer DCA Hb A1c Waived - CMP14+EGFR  2. Hyperlipidemia associated with type 2 diabetes mellitus (HCC) Low fat diet - Lipid panel  3. BMI 27.0-27.9,adult Discussed diet and exercise for person with BMI >25 Will recheck weight in 3-6 months  4. Retinitis pigmentosa Keep follow up with eye doctor.   Labs pending Health maintenance reviewed Diet and exercise encouraged Continue all meds Follow up  In 3 months   [place], FNP

## 2016-09-08 NOTE — Patient Instructions (Signed)

## 2016-09-09 LAB — CMP14+EGFR
ALK PHOS: 67 IU/L (ref 39–117)
ALT: 27 IU/L (ref 0–44)
AST: 21 IU/L (ref 0–40)
Albumin/Globulin Ratio: 1.8 (ref 1.2–2.2)
Albumin: 4.6 g/dL (ref 3.5–5.5)
BILIRUBIN TOTAL: 0.6 mg/dL (ref 0.0–1.2)
BUN/Creatinine Ratio: 10 (ref 9–20)
BUN: 9 mg/dL (ref 6–24)
CHLORIDE: 98 mmol/L (ref 96–106)
CO2: 25 mmol/L (ref 18–29)
Calcium: 9.9 mg/dL (ref 8.7–10.2)
Creatinine, Ser: 0.91 mg/dL (ref 0.76–1.27)
GFR calc Af Amer: 109 mL/min/{1.73_m2} (ref >59)
GFR calc non Af Amer: 94 mL/min/{1.73_m2} (ref >59)
GLUCOSE: 168 mg/dL — AB (ref 65–99)
Globulin, Total: 2.6 g/dL (ref 1.5–4.5)
Potassium: 5.2 mmol/L (ref 3.5–5.2)
Sodium: 138 mmol/L (ref 134–144)
Total Protein: 7.2 g/dL (ref 6.0–8.5)

## 2016-09-09 LAB — LIPID PANEL
CHOL/HDL RATIO: 4.2 ratio (ref 0.0–5.0)
Cholesterol, Total: 169 mg/dL (ref 100–199)
HDL: 40 mg/dL (ref >39)
LDL Calculated: 100 mg/dL — ABNORMAL HIGH (ref 0–99)
Triglycerides: 145 mg/dL (ref 0–149)
VLDL CHOLESTEROL CAL: 29 mg/dL (ref 5–40)

## 2016-09-28 ENCOUNTER — Other Ambulatory Visit: Payer: Self-pay

## 2016-09-28 DIAGNOSIS — E1169 Type 2 diabetes mellitus with other specified complication: Secondary | ICD-10-CM

## 2016-09-28 DIAGNOSIS — E119 Type 2 diabetes mellitus without complications: Secondary | ICD-10-CM

## 2016-09-28 DIAGNOSIS — E785 Hyperlipidemia, unspecified: Secondary | ICD-10-CM

## 2016-09-28 MED ORDER — METFORMIN HCL 1000 MG PO TABS: 1000.0000 mg | ORAL_TABLET | Freq: Two times a day (BID) | ORAL | 1 refills | Status: DC

## 2016-09-28 MED ORDER — ROSUVASTATIN CALCIUM 20 MG PO TABS: 20.0000 mg | ORAL_TABLET | Freq: Every day | ORAL | 1 refills | Status: DC

## 2016-12-16 ENCOUNTER — Encounter: Payer: Self-pay | Admitting: Nurse Practitioner

## 2016-12-16 ENCOUNTER — Ambulatory Visit (INDEPENDENT_AMBULATORY_CARE_PROVIDER_SITE_OTHER): Payer: Medicare HMO | Admitting: Nurse Practitioner

## 2016-12-16 VITALS — BP 127/87 | HR 81 | Temp 96.7°F | Ht 69.0 in | Wt 184.0 lb

## 2016-12-16 DIAGNOSIS — E1169 Type 2 diabetes mellitus with other specified complication: Secondary | ICD-10-CM

## 2016-12-16 DIAGNOSIS — H3552 Pigmentary retinal dystrophy: Secondary | ICD-10-CM

## 2016-12-16 DIAGNOSIS — E785 Hyperlipidemia, unspecified: Secondary | ICD-10-CM

## 2016-12-16 DIAGNOSIS — Z6827 Body mass index (BMI) 27.0-27.9, adult: Secondary | ICD-10-CM

## 2016-12-16 DIAGNOSIS — E119 Type 2 diabetes mellitus without complications: Secondary | ICD-10-CM

## 2016-12-16 LAB — BAYER DCA HB A1C WAIVED: HB A1C (BAYER DCA - WAIVED): 8.1 % — ABNORMAL HIGH (ref <7.0)

## 2016-12-16 MED ORDER — GLIPIZIDE 5 MG PO TABS: 5.0000 mg | ORAL_TABLET | Freq: Two times a day (BID) | ORAL | 3 refills | Status: DC

## 2016-12-16 NOTE — Progress Notes (Signed)
Subjective:    Patient ID: Patrick Valentine, male    DOB: 05-26-1961, 56 y.o.   MRN: [id number]   Patient here today for follow up of chronic medical problems. No changes since last visit. No complaints today.  Outpatient Encounter Prescriptions as of 12/16/2016  Medication Sig  . Blood Glucose Monitoring Suppl (ACCU-CHEK NANO SMARTVIEW) w/Device KIT Use to test BG bid. DX: E11.9  . glucose blood (ACCU-CHEK SMARTVIEW) test strip Test Bid. DX:E11.9  . Lancets (ACCU-CHEK SOFT TOUCH) lancets Test Bid. DXE11.9  . metFORMIN (GLUCOPHAGE) 1000 MG tablet Take 1 tablet (1,000 mg total) by mouth 2 (two) times daily with a meal.  . rosuvastatin (CRESTOR) 20 MG tablet Take 1 tablet (20 mg total) by mouth daily.   No facility-administered encounter medications on file as of 12/16/2016.      Hyperlipidemia  This is a chronic problem. The current episode started more than 1 year ago. The problem is uncontrolled. Recent lipid tests were reviewed and are high. Exacerbating diseases include diabetes. He has no history of hypothyroidism or obesity. Pertinent negatives include no chest pain. Current antihyperlipidemic treatment includes statins. Improvement on treatment: first time having labs drawn since going on meds. Compliance problems include adherence to diet and adherence to exercise.  Risk factors for coronary artery disease include diabetes mellitus, dyslipidemia and male sex.  Diabetes  He presents for his follow-up diabetic visit. He has type 2 diabetes mellitus. No MedicAlert identification noted. His disease course has been improving. There are no hypoglycemic associated symptoms. Pertinent negatives for diabetes include no chest pain. There are no hypoglycemic complications. Symptoms are improving. There are no diabetic complications. Pertinent negatives for diabetic complications include no CVA, heart disease, impotence or retinopathy. Risk factors for coronary artery disease include diabetes mellitus,  dyslipidemia, hypertension and male sex. Current diabetic treatment includes oral agent (monotherapy). He is compliant with treatment all of the time. His weight is stable. He is following a diabetic diet. He has not had a previous visit with a dietitian. He participates in exercise every other day. His home blood glucose trend is decreasing steadily. His breakfast blood glucose is taken between 8-9 am. His breakfast blood glucose range is generally 110-130 mg/dl. His highest blood glucose is 140-180 mg/dl. His overall blood glucose range is 110-130 mg/dl. An ACE inhibitor/angiotensin II receptor blocker is not being taken. He does not see a podiatrist.Eye exam is not current.   retinitis pigmentosis Vision is gradually worsening- Saw eye doctor during summer. Eye sight is currently stable.   Review of Systems  Constitutional: Negative.   HENT: Negative.   Respiratory: Negative.   Cardiovascular: Negative for chest pain, palpitations and leg swelling.  Gastrointestinal: Negative.   Endocrine: Negative.   Genitourinary: Negative.  Negative for impotence.  Neurological: Negative.   Hematological: Negative.   Psychiatric/Behavioral: Negative.   All other systems reviewed and are negative.      Objective:   Physical Exam  Constitutional: He is oriented to person, place, and time. He appears well-developed and well-nourished.  HENT:  Head: Normocephalic.  Right Ear: External ear normal.  Left Ear: External ear normal.  Nose: Nose normal.  Mouth/Throat: Oropharynx is clear and moist.  Eyes: EOM are normal. Pupils are equal, round, and reactive to light.  Neck: Normal range of motion. Neck supple. No JVD present. No thyromegaly present.  Cardiovascular: Normal rate, regular rhythm, normal heart sounds and intact distal pulses.  Exam reveals no gallop and no friction rub.  No murmur heard. Pulmonary/Chest: Effort normal and breath sounds normal. No respiratory distress. He has no wheezes.  He has no rales. He exhibits no tenderness.  Abdominal: Soft. Bowel sounds are normal. He exhibits no mass. There is no tenderness.  Genitourinary: Prostate normal and penis normal.  Musculoskeletal: Normal range of motion. He exhibits no edema.  Lymphadenopathy:    He has no cervical adenopathy.  Neurological: He is alert and oriented to person, place, and time. No cranial nerve deficit.  Skin: Skin is warm and dry.  Psychiatric: He has a normal mood and affect. His behavior is normal. Judgment and thought content normal.   BP 127/87   Pulse 81   Temp (!) 96.7 F (35.9 C) (Oral)   Ht 5' 9"  (1.753 m)   Wt 184 lb (83.5 kg)   BMI 27.17 kg/m    hgba1c 8.1% up from 7.6%    Assessment & Plan:  1. Type 2 diabetes mellitus not at goal [facility]) Added glipizide to meds Keep diary of blood sugars - Bayer DCA Hb A1c Waived - CMP14+EGFR - Microalbumin / creatinine urine ratio - glipiZIDE (GLUCOTROL) 5 MG tablet; Take 1 tablet (5 mg total) by mouth 2 (two) times daily before a meal.  Dispense: 60 tablet; Refill: 3  2. Hyperlipidemia associated with type 2 diabetes mellitus (HCC) Low fat diet - Lipid panel  3. BMI 27.0-27.9,adult Discussed diet and exercise for person with BMI >25 Will recheck weight in 3-6 months  4. Retinitis pigmentosa of both eyes Keep follow up with eye doctor    Labs pending Health maintenance reviewed Diet and exercise encouraged Continue all meds Follow up  In 3 months   [place], FNP

## 2016-12-16 NOTE — Patient Instructions (Signed)
Carbohydrate Counting for Diabetes Mellitus, Adult Carbohydrate counting is a method for keeping track of how many carbohydrates you eat. Eating carbohydrates naturally increases the amount of sugar (glucose) in the blood. Counting how many carbohydrates you eat helps keep your blood glucose within normal limits, which helps you manage your diabetes (diabetes mellitus). It is important to know how many carbohydrates you can safely have in each meal. This is different for every person. A diet and nutrition specialist (registered dietitian) can help you make a meal plan and calculate how many carbohydrates you should have at each meal and snack. Carbohydrates are found in the following foods:  Grains, such as breads and cereals.  Dried beans and soy products.  Starchy vegetables, such as potatoes, peas, and corn.  Fruit and fruit juices.  Milk and yogurt.  Sweets and snack foods, such as cake, cookies, candy, chips, and soft drinks. How do I count carbohydrates? There are two ways to count carbohydrates in food. You can use either of the methods or a combination of both. Reading "Nutrition Facts" on packaged food  The "Nutrition Facts" list is included on the labels of almost all packaged foods and beverages in the [place] It includes:  The serving size.  Information about nutrients in each serving, including the grams (g) of carbohydrate per serving. To use the "Nutrition Facts":  Decide how many servings you will have.  Multiply the number of servings by the number of carbohydrates per serving.  The resulting number is the total amount of carbohydrates that you will be having. Learning standard serving sizes of other foods  When you eat foods containing carbohydrates that are not packaged or do not include "Nutrition Facts" on the label, you need to measure the servings in order to count the amount of carbohydrates:  Measure the foods that you will eat with a food scale or measuring  cup, if needed.  Decide how many standard-size servings you will eat.  Multiply the number of servings by 15. Most carbohydrate-rich foods have about 15 g of carbohydrates per serving.  For example, if you eat 8 oz (170 g) of strawberries, you will have eaten 2 servings and 30 g of carbohydrates (2 servings x 15 g = 30 g).  For foods that have more than one food mixed, such as soups and casseroles, you must count the carbohydrates in each food that is included. The following list contains standard serving sizes of common carbohydrate-rich foods. Each of these servings has about 15 g of carbohydrates:   hamburger bun or  English muffin.   oz (15 mL) syrup.   oz (14 g) jelly.  1 slice of bread.  1 six-inch tortilla.  3 oz (85 g) cooked rice or pasta.  4 oz (113 g) cooked dried beans.  4 oz (113 g) starchy vegetable, such as peas, corn, or potatoes.  4 oz (113 g) hot cereal.  4 oz (113 g) mashed potatoes or  of a large baked potato.  4 oz (113 g) canned or frozen fruit.  4 oz (120 mL) fruit juice.  4-6 crackers.  6 chicken nuggets.  6 oz (170 g) unsweetened dry cereal.  6 oz (170 g) plain fat-free yogurt or yogurt sweetened with artificial sweeteners.  8 oz (240 mL) milk.  8 oz (170 g) fresh fruit or one small piece of fruit.  24 oz (680 g) popped popcorn. Example of carbohydrate counting Sample meal  3 oz (85 g) chicken breast.  6 oz (  170 g) brown rice.  4 oz (113 g) corn.  8 oz (240 mL) milk.  8 oz (170 g) strawberries with sugar-free whipped topping. Carbohydrate calculation 1. Identify the foods that contain carbohydrates:  Rice.  Corn.  Milk.  Strawberries. 2. Calculate how many servings you have of each food:  2 servings rice.  1 serving corn.  1 serving milk.  1 serving strawberries. 3. Multiply each number of servings by 15 g:  2 servings rice x 15 g = 30 g.  1 serving corn x 15 g = 15 g.  1 serving milk x 15 g = 15  g.  1 serving strawberries x 15 g = 15 g. 4. Add together all of the amounts to find the total grams of carbohydrates eaten:  30 g + 15 g + 15 g + 15 g = 75 g of carbohydrates total. This information is not intended to replace advice given to you by your health care provider. Make sure you discuss any questions you have with your health care provider. Document Released: 11/30/2005 Document Revised: 06/19/2016 Document Reviewed: 05/13/2016 Elsevier Interactive Patient Education  2017 Elsevier Inc.  

## 2016-12-17 LAB — CMP14+EGFR
A/G RATIO: 1.8 (ref 1.2–2.2)
ALK PHOS: 58 IU/L (ref 39–117)
ALT: 27 IU/L (ref 0–44)
AST: 17 IU/L (ref 0–40)
Albumin: 4.7 g/dL (ref 3.5–5.5)
BUN / CREAT RATIO: 13 (ref 9–20)
BUN: 11 mg/dL (ref 6–24)
Bilirubin Total: 0.5 mg/dL (ref 0.0–1.2)
CO2: 24 mmol/L (ref 18–29)
Calcium: 10.2 mg/dL (ref 8.7–10.2)
Chloride: 99 mmol/L (ref 96–106)
Creatinine, Ser: 0.86 mg/dL (ref 0.76–1.27)
GFR calc Af Amer: 112 mL/min/{1.73_m2} (ref >59)
GFR calc non Af Amer: 97 mL/min/{1.73_m2} (ref >59)
Globulin, Total: 2.6 g/dL (ref 1.5–4.5)
Glucose: 167 mg/dL — ABNORMAL HIGH (ref 65–99)
POTASSIUM: 5.5 mmol/L — AB (ref 3.5–5.2)
Sodium: 141 mmol/L (ref 134–144)
Total Protein: 7.3 g/dL (ref 6.0–8.5)

## 2016-12-17 LAB — LIPID PANEL
CHOL/HDL RATIO: 4.8 ratio (ref 0.0–5.0)
Cholesterol, Total: 190 mg/dL (ref 100–199)
HDL: 40 mg/dL (ref >39)
LDL CALC: 111 mg/dL — AB (ref 0–99)
Triglycerides: 195 mg/dL — ABNORMAL HIGH (ref 0–149)
VLDL Cholesterol Cal: 39 mg/dL (ref 5–40)

## 2016-12-17 LAB — MICROALBUMIN / CREATININE URINE RATIO
CREATININE, UR: 91.4 mg/dL
MICROALBUM., U, RANDOM: 4.5 ug/mL
Microalb/Creat Ratio: 4.9 mg/g creat (ref 0.0–30.0)

## 2017-03-18 ENCOUNTER — Encounter: Payer: Self-pay | Admitting: Nurse Practitioner

## 2017-03-18 ENCOUNTER — Ambulatory Visit (INDEPENDENT_AMBULATORY_CARE_PROVIDER_SITE_OTHER): Payer: Medicare HMO | Admitting: Nurse Practitioner

## 2017-03-18 VITALS — BP 139/87 | HR 78 | Temp 96.7°F | Ht 69.0 in | Wt 188.0 lb

## 2017-03-18 DIAGNOSIS — E1169 Type 2 diabetes mellitus with other specified complication: Secondary | ICD-10-CM | POA: Diagnosis not present

## 2017-03-18 DIAGNOSIS — E785 Hyperlipidemia, unspecified: Secondary | ICD-10-CM | POA: Diagnosis not present

## 2017-03-18 DIAGNOSIS — E119 Type 2 diabetes mellitus without complications: Secondary | ICD-10-CM | POA: Diagnosis not present

## 2017-03-18 DIAGNOSIS — Z6827 Body mass index (BMI) 27.0-27.9, adult: Secondary | ICD-10-CM | POA: Diagnosis not present

## 2017-03-18 DIAGNOSIS — H3552 Pigmentary retinal dystrophy: Secondary | ICD-10-CM | POA: Diagnosis not present

## 2017-03-18 DIAGNOSIS — Z23 Encounter for immunization: Secondary | ICD-10-CM | POA: Diagnosis not present

## 2017-03-18 LAB — LIPID PANEL
CHOLESTEROL TOTAL: 170 mg/dL (ref 100–199)
Chol/HDL Ratio: 4.6 ratio (ref 0.0–5.0)
HDL: 37 mg/dL — ABNORMAL LOW (ref >39)
LDL CALC: 103 mg/dL — AB (ref 0–99)
Triglycerides: 152 mg/dL — ABNORMAL HIGH (ref 0–149)
VLDL CHOLESTEROL CAL: 30 mg/dL (ref 5–40)

## 2017-03-18 LAB — CMP14+EGFR
ALBUMIN: 4.7 g/dL (ref 3.5–5.5)
ALT: 20 IU/L (ref 0–44)
AST: 15 IU/L (ref 0–40)
Albumin/Globulin Ratio: 2 (ref 1.2–2.2)
Alkaline Phosphatase: 48 IU/L (ref 39–117)
BUN / CREAT RATIO: 11 (ref 9–20)
BUN: 9 mg/dL (ref 6–24)
Bilirubin Total: 0.6 mg/dL (ref 0.0–1.2)
CO2: 26 mmol/L (ref 18–29)
CREATININE: 0.84 mg/dL (ref 0.76–1.27)
Calcium: 10 mg/dL (ref 8.7–10.2)
Chloride: 98 mmol/L (ref 96–106)
GFR calc Af Amer: 113 mL/min/{1.73_m2} (ref >59)
GFR calc non Af Amer: 98 mL/min/{1.73_m2} (ref >59)
GLUCOSE: 169 mg/dL — AB (ref 65–99)
Globulin, Total: 2.4 g/dL (ref 1.5–4.5)
Potassium: 5.3 mmol/L — ABNORMAL HIGH (ref 3.5–5.2)
Sodium: 139 mmol/L (ref 134–144)
TOTAL PROTEIN: 7.1 g/dL (ref 6.0–8.5)

## 2017-03-18 LAB — BAYER DCA HB A1C WAIVED: HB A1C: 6.6 % (ref <7.0)

## 2017-03-18 MED ORDER — ROSUVASTATIN CALCIUM 20 MG PO TABS: 20.0000 mg | ORAL_TABLET | Freq: Every day | ORAL | 1 refills | Status: DC

## 2017-03-18 MED ORDER — METFORMIN HCL 1000 MG PO TABS: 1000.0000 mg | ORAL_TABLET | Freq: Two times a day (BID) | ORAL | 1 refills | Status: DC

## 2017-03-18 NOTE — Addendum Note (Signed)
Addended by: Rolena Infante on: 03/18/2017 09:44 AM   Modules accepted: Orders

## 2017-03-18 NOTE — Progress Notes (Signed)
Subjective:    Patient ID: Patrick Valentine, male    DOB: 04/19/1961, 56 y.o.   MRN: [id number]  HPI  Patrick Valentine is here today for follow up of chronic medical problem.  Outpatient Encounter Prescriptions as of 03/18/2017  Medication Sig  . Blood Glucose Monitoring Suppl (ACCU-CHEK NANO SMARTVIEW) w/Device KIT Use to test BG bid. DX: E11.9  . glipiZIDE (GLUCOTROL) 5 MG tablet Take 1 tablet (5 mg total) by mouth 2 (two) times daily before a meal.  . glucose blood (ACCU-CHEK SMARTVIEW) test strip Test Bid. DX:E11.9  . Lancets (ACCU-CHEK SOFT TOUCH) lancets Test Bid. DXE11.9  . metFORMIN (GLUCOPHAGE) 1000 MG tablet Take 1 tablet (1,000 mg total) by mouth 2 (two) times daily with a meal.  . rosuvastatin (CRESTOR) 20 MG tablet Take 1 tablet (20 mg total) by mouth daily.   No facility-administered encounter medications on file as of 03/18/2017.     1. Hyperlipidemia associated with type 2 diabetes mellitus ([place])  He tries to watch diet- exercising some- currently oncrestor  2. Type 2 diabetes mellitus not at goal [facility])  Average blood sugars have been around 150 fasting- he does not check everyday   3. BMI 27.0-27.9,adult  NO weight changes reently  4. Retinitis pigmentosa of both eyes  Vision has been unchanged since last visit- sees eye doctor every 6 months    New complaints: None today     Review of Systems  Constitutional: Negative for diaphoresis.  Eyes: Negative for pain.  Respiratory: Negative for shortness of breath.   Cardiovascular: Negative for chest pain, palpitations and leg swelling.  Gastrointestinal: Negative for abdominal pain.  Endocrine: Negative for polydipsia.  Skin: Negative for rash.  Neurological: Negative for dizziness, weakness and headaches.  Hematological: Does not bruise/bleed easily.       Objective:   Physical Exam  Constitutional: He is oriented to person, place, and time. He appears well-developed and well-nourished.  HENT:  Head:  Normocephalic.  Right Ear: External ear normal.  Left Ear: External ear normal.  Nose: Nose normal.  Mouth/Throat: Oropharynx is clear and moist.  Eyes: EOM are normal. Pupils are equal, round, and reactive to light.  Neck: Normal range of motion. Neck supple. No JVD present. No thyromegaly present.  Cardiovascular: Normal rate, regular rhythm, normal heart sounds and intact distal pulses.  Exam reveals no gallop and no friction rub.   No murmur heard. Pulmonary/Chest: Effort normal and breath sounds normal. No respiratory distress. He has no wheezes. He has no rales. He exhibits no tenderness.  Abdominal: Soft. Bowel sounds are normal. He exhibits no mass. There is no tenderness.  Genitourinary: Prostate normal and penis normal.  Musculoskeletal: Normal range of motion. He exhibits no edema.  Lymphadenopathy:    He has no cervical adenopathy.  Neurological: He is alert and oriented to person, place, and time. No cranial nerve deficit.  Skin: Skin is warm and dry.  Psychiatric: He has a normal mood and affect. His behavior is normal. Judgment and thought content normal.   BP 139/87   Pulse 78   Temp (!) 96.7 F (35.9 C) (Oral)   Ht _0  (1.753 m)   Wt 188 lb (85.3 kg)   BMI 27.76 kg/m      Assessment & Plan:  1. Hyperlipidemia associated with type 2 diabetes mellitus (HCC) Low fat diet - Lipid panel - rosuvastatin (CRESTOR) 20 MG tablet; Take 1 tablet (20 mg total) by mouth daily.  Dispense: 90 tablet; Refill:  1  2. Type 2 diabetes mellitus not at goal [facility]) Continue to watch carbs in diet Patient going to stop glipizide when his rx runs out- if blood sugars start increasing then he will go backon it and let me know - Bayer DCA Hb A1c Waived - CMP14+EGFR - metFORMIN (GLUCOPHAGE) 1000 MG tablet; Take 1 tablet (1,000 mg total) by mouth 2 (two) times daily with a meal.  Dispense: 180 tablet; Refill: 1  3. BMI 27.0-27.9,adult Discussed diet and exercise for person with BMI  >25 Will recheck weight in 3-6 months  4. Retinitis pigmentosa of both eyes Keep follow up with eye specialist    Labs pending Health maintenance reviewed Diet and exercise encouraged Continue all meds Follow up  In 3 months   [place], FNP

## 2017-06-18 ENCOUNTER — Ambulatory Visit (INDEPENDENT_AMBULATORY_CARE_PROVIDER_SITE_OTHER): Payer: Medicare HMO | Admitting: Nurse Practitioner

## 2017-06-18 ENCOUNTER — Encounter: Payer: Self-pay | Admitting: Nurse Practitioner

## 2017-06-18 VITALS — BP 125/88 | HR 87 | Temp 97.0°F | Ht 69.0 in | Wt 180.0 lb

## 2017-06-18 DIAGNOSIS — E1169 Type 2 diabetes mellitus with other specified complication: Secondary | ICD-10-CM | POA: Diagnosis not present

## 2017-06-18 DIAGNOSIS — E119 Type 2 diabetes mellitus without complications: Secondary | ICD-10-CM

## 2017-06-18 DIAGNOSIS — E785 Hyperlipidemia, unspecified: Secondary | ICD-10-CM | POA: Diagnosis not present

## 2017-06-18 DIAGNOSIS — Z6826 Body mass index (BMI) 26.0-26.9, adult: Secondary | ICD-10-CM | POA: Diagnosis not present

## 2017-06-18 DIAGNOSIS — H3552 Pigmentary retinal dystrophy: Secondary | ICD-10-CM

## 2017-06-18 LAB — BAYER DCA HB A1C WAIVED: HB A1C (BAYER DCA - WAIVED): 7.5 % — ABNORMAL HIGH

## 2017-06-18 MED ORDER — GLIPIZIDE 5 MG PO TABS: 5.0000 mg | ORAL_TABLET | Freq: Two times a day (BID) | ORAL | 3 refills | Status: DC

## 2017-06-18 MED ORDER — ROSUVASTATIN CALCIUM 20 MG PO TABS: 20.0000 mg | ORAL_TABLET | Freq: Every day | ORAL | 1 refills | Status: DC

## 2017-06-18 MED ORDER — METFORMIN HCL 1000 MG PO TABS: 1000.0000 mg | ORAL_TABLET | Freq: Two times a day (BID) | ORAL | 1 refills | Status: DC

## 2017-06-18 NOTE — Patient Instructions (Signed)

## 2017-06-18 NOTE — Progress Notes (Signed)
Subjective:    Patient ID: Patrick Valentine, male    DOB: Apr 25, 1961, 56 y.o.   MRN: [id number]  HPI   Patrick Valentine is here today for follow up of chronic medical problem.  Outpatient Encounter Prescriptions as of 06/18/2017  Medication Sig  . Blood Glucose Monitoring Suppl (ACCU-CHEK NANO SMARTVIEW) w/Device KIT Use to test BG bid. DX: E11.9  . glipiZIDE (GLUCOTROL) 5 MG tablet Take 1 tablet (5 mg total) by mouth 2 (two) times daily before a meal.  . glucose blood (ACCU-CHEK SMARTVIEW) test strip Test Bid. DX:E11.9  . Lancets (ACCU-CHEK SOFT TOUCH) lancets Test Bid. DXE11.9  . metFORMIN (GLUCOPHAGE) 1000 MG tablet Take 1 tablet (1,000 mg total) by mouth 2 (two) times daily with a meal.  . rosuvastatin (CRESTOR) 20 MG tablet Take 1 tablet (20 mg total) by mouth daily.     1. Type 2 diabetes mellitus not at goal [facility])  Last HGBA1c was 6.6% he exercises everyday. Blood sugars have not changed from last visit.   2. Hyperlipidemia associated with type 2 diabetes mellitus ([place])  Low fat diet daily  3. Retinitis pigmentosa of both eyes  Sees eye doctor every 6 months- no change in vision. Needs new specialist because the doctor he has been going to is not in his network.  4. BMI 27.0-27.9,adult  No change in weight    New complaints: None today    Review of Systems  Constitutional: Negative for activity change and appetite change.  HENT: Negative.   Eyes: Negative for pain.  Respiratory: Negative for shortness of breath.   Cardiovascular: Negative for chest pain, palpitations and leg swelling.  Gastrointestinal: Negative for abdominal pain.  Endocrine: Negative for polydipsia.  Genitourinary: Negative.   Skin: Negative for rash.  Neurological: Negative for dizziness, weakness and headaches.  Hematological: Does not bruise/bleed easily.  Psychiatric/Behavioral: Negative.   All other systems reviewed and are negative.      Objective:   Physical Exam  Constitutional: He  is oriented to person, place, and time. He appears well-developed and well-nourished.  HENT:  Head: Normocephalic.  Right Ear: External ear normal.  Left Ear: External ear normal.  Nose: Nose normal.  Mouth/Throat: Oropharynx is clear and moist.  Hearing aids in place  Eyes: EOM are normal. Pupils are equal, round, and reactive to light.  Neck: Normal range of motion. Neck supple. No JVD present. No thyromegaly present.  Cardiovascular: Normal rate, regular rhythm, normal heart sounds and intact distal pulses.  Exam reveals no gallop and no friction rub.   No murmur heard. Pulmonary/Chest: Effort normal and breath sounds normal. No respiratory distress. He has no wheezes. He has no rales. He exhibits no tenderness.  Abdominal: Soft. Bowel sounds are normal. He exhibits no mass. There is no tenderness.  Genitourinary: Prostate normal and penis normal.  Musculoskeletal: Normal range of motion. He exhibits no edema.  Lymphadenopathy:    He has no cervical adenopathy.  Neurological: He is alert and oriented to person, place, and time. No cranial nerve deficit.  Skin: Skin is warm and dry.  Psychiatric: He has a normal mood and affect. His behavior is normal. Judgment and thought content normal.   BP 125/88   Pulse 87   Temp (!) 97 F (36.1 C) (Oral)   Ht 5' 9"  (1.753 m)   Wt 180 lb (81.6 kg)   BMI 26.58 kg/m      Assessment & Plan:  1. Type 2 diabetes mellitus not at goal (  [place]) Stricter carb counting - CMP14+EGFR - Bayer DCA Hb A1c Waived - metFORMIN (GLUCOPHAGE) 1000 MG tablet; Take 1 tablet (1,000 mg total) by mouth 2 (two) times daily with a meal.  Dispense: 180 tablet; Refill: 1 - Microalbumin / creatinine urine ratio  2. Hyperlipidemia associated with type 2 diabetes mellitus (HCC) Low fat diet - Lipid panel - rosuvastatin (CRESTOR) 20 MG tablet; Take 1 tablet (20 mg total) by mouth daily.  Dispense: 90 tablet; Refill: 1  3. Retinitis pigmentosa of both eyes He will  call with physicians in his network for referral  4. BMI 26.0-26.9,adult Discussed diet and exercise for person with BMI >25 Will recheck weight in 3-6 months      Labs pending Health maintenance reviewed Diet and exercise encouraged Continue all meds Follow up  In 3 months   [place], FNP

## 2017-06-19 LAB — CMP14+EGFR
ALT: 18 IU/L (ref 0–44)
AST: 15 IU/L (ref 0–40)
Albumin/Globulin Ratio: 2 (ref 1.2–2.2)
Albumin: 4.7 g/dL (ref 3.5–5.5)
Alkaline Phosphatase: 56 IU/L (ref 39–117)
BUN/Creatinine Ratio: 10 (ref 9–20)
BUN: 10 mg/dL (ref 6–24)
Bilirubin Total: 0.5 mg/dL (ref 0.0–1.2)
CO2: 28 mmol/L (ref 20–29)
CREATININE: 0.97 mg/dL (ref 0.76–1.27)
Calcium: 10.1 mg/dL (ref 8.7–10.2)
Chloride: 96 mmol/L (ref 96–106)
GFR calc Af Amer: 100 mL/min/{1.73_m2} (ref >59)
GFR calc non Af Amer: 87 mL/min/{1.73_m2} (ref >59)
Globulin, Total: 2.4 g/dL (ref 1.5–4.5)
Glucose: 235 mg/dL — ABNORMAL HIGH (ref 65–99)
Potassium: 5.2 mmol/L (ref 3.5–5.2)
Sodium: 137 mmol/L (ref 134–144)
Total Protein: 7.1 g/dL (ref 6.0–8.5)

## 2017-06-19 LAB — LIPID PANEL
Chol/HDL Ratio: 3.8 ratio (ref 0.0–5.0)
Cholesterol, Total: 159 mg/dL (ref 100–199)
HDL: 42 mg/dL (ref >39)
LDL CALC: 91 mg/dL (ref 0–99)
TRIGLYCERIDES: 132 mg/dL (ref 0–149)
VLDL Cholesterol Cal: 26 mg/dL (ref 5–40)

## 2017-06-19 LAB — MICROALBUMIN / CREATININE URINE RATIO
Creatinine, Urine: 69.7 mg/dL
MICROALB/CREAT RATIO: 6 mg/g{creat} (ref 0.0–30.0)
Microalbumin, Urine: 4.2 ug/mL

## 2017-06-21 ENCOUNTER — Other Ambulatory Visit: Payer: Self-pay

## 2017-06-21 DIAGNOSIS — E1169 Type 2 diabetes mellitus with other specified complication: Secondary | ICD-10-CM

## 2017-06-21 DIAGNOSIS — E785 Hyperlipidemia, unspecified: Secondary | ICD-10-CM

## 2017-06-21 DIAGNOSIS — E119 Type 2 diabetes mellitus without complications: Secondary | ICD-10-CM

## 2017-06-21 MED ORDER — ROSUVASTATIN CALCIUM 20 MG PO TABS
20.0000 mg | ORAL_TABLET | Freq: Every day | ORAL | 1 refills | Status: DC
Start: 2017-06-21 — End: 2017-09-21

## 2017-06-21 MED ORDER — METFORMIN HCL 1000 MG PO TABS: 1000.0000 mg | ORAL_TABLET | Freq: Two times a day (BID) | ORAL | 1 refills | Status: DC

## 2017-06-29 ENCOUNTER — Telehealth: Payer: Self-pay | Admitting: Nurse Practitioner

## 2017-06-29 ENCOUNTER — Other Ambulatory Visit: Payer: Self-pay | Admitting: Nurse Practitioner

## 2017-06-29 DIAGNOSIS — H3552 Pigmentary retinal dystrophy: Secondary | ICD-10-CM

## 2017-06-29 NOTE — Telephone Encounter (Signed)
Please advise 

## 2017-07-01 NOTE — Telephone Encounter (Signed)
Spoke with patient and information given to referral coordinator

## 2017-07-13 DIAGNOSIS — H3552 Pigmentary retinal dystrophy: Secondary | ICD-10-CM | POA: Diagnosis not present

## 2017-07-13 DIAGNOSIS — E119 Type 2 diabetes mellitus without complications: Secondary | ICD-10-CM | POA: Diagnosis not present

## 2017-07-13 DIAGNOSIS — Z961 Presence of intraocular lens: Secondary | ICD-10-CM | POA: Diagnosis not present

## 2017-07-13 DIAGNOSIS — H04123 Dry eye syndrome of bilateral lacrimal glands: Secondary | ICD-10-CM | POA: Diagnosis not present

## 2017-07-13 LAB — HM DIABETES EYE EXAM

## 2017-09-21 ENCOUNTER — Encounter: Payer: Self-pay | Admitting: Nurse Practitioner

## 2017-09-21 ENCOUNTER — Ambulatory Visit (INDEPENDENT_AMBULATORY_CARE_PROVIDER_SITE_OTHER): Payer: Medicare HMO | Admitting: Nurse Practitioner

## 2017-09-21 VITALS — BP 139/84 | HR 87 | Temp 97.4°F | Ht 69.0 in | Wt 179.6 lb

## 2017-09-21 DIAGNOSIS — E785 Hyperlipidemia, unspecified: Secondary | ICD-10-CM | POA: Diagnosis not present

## 2017-09-21 DIAGNOSIS — Z23 Encounter for immunization: Secondary | ICD-10-CM | POA: Diagnosis not present

## 2017-09-21 DIAGNOSIS — H3552 Pigmentary retinal dystrophy: Secondary | ICD-10-CM | POA: Diagnosis not present

## 2017-09-21 DIAGNOSIS — Z6826 Body mass index (BMI) 26.0-26.9, adult: Secondary | ICD-10-CM

## 2017-09-21 DIAGNOSIS — E1169 Type 2 diabetes mellitus with other specified complication: Secondary | ICD-10-CM | POA: Diagnosis not present

## 2017-09-21 DIAGNOSIS — E119 Type 2 diabetes mellitus without complications: Secondary | ICD-10-CM

## 2017-09-21 LAB — LIPID PANEL
CHOLESTEROL TOTAL: 169 mg/dL (ref 100–199)
Chol/HDL Ratio: 4.3 ratio (ref 0.0–5.0)
HDL: 39 mg/dL — ABNORMAL LOW (ref >39)
LDL Calculated: 102 mg/dL — ABNORMAL HIGH (ref 0–99)
Triglycerides: 140 mg/dL (ref 0–149)
VLDL Cholesterol Cal: 28 mg/dL (ref 5–40)

## 2017-09-21 LAB — CMP14+EGFR
A/G RATIO: 2 (ref 1.2–2.2)
ALBUMIN: 4.8 g/dL (ref 3.5–5.5)
ALK PHOS: 59 IU/L (ref 39–117)
ALT: 25 IU/L (ref 0–44)
AST: 18 IU/L (ref 0–40)
BILIRUBIN TOTAL: 0.6 mg/dL (ref 0.0–1.2)
BUN / CREAT RATIO: 14 (ref 9–20)
BUN: 12 mg/dL (ref 6–24)
CO2: 24 mmol/L (ref 20–29)
Calcium: 10.1 mg/dL (ref 8.7–10.2)
Chloride: 93 mmol/L — ABNORMAL LOW (ref 96–106)
Creatinine, Ser: 0.88 mg/dL (ref 0.76–1.27)
GFR calc non Af Amer: 95 mL/min/{1.73_m2} (ref >59)
GFR, EST AFRICAN AMERICAN: 110 mL/min/{1.73_m2} (ref >59)
GLUCOSE: 202 mg/dL — AB (ref 65–99)
Globulin, Total: 2.4 g/dL (ref 1.5–4.5)
POTASSIUM: 4.5 mmol/L (ref 3.5–5.2)
SODIUM: 134 mmol/L (ref 134–144)
TOTAL PROTEIN: 7.2 g/dL (ref 6.0–8.5)

## 2017-09-21 LAB — BAYER DCA HB A1C WAIVED: HB A1C (BAYER DCA - WAIVED): 8.6 % — ABNORMAL HIGH (ref <7.0)

## 2017-09-21 MED ORDER — ROSUVASTATIN CALCIUM 20 MG PO TABS: 20.0000 mg | ORAL_TABLET | Freq: Every day | ORAL | 1 refills | Status: DC

## 2017-09-21 MED ORDER — METFORMIN HCL 1000 MG PO TABS: 1000.0000 mg | ORAL_TABLET | Freq: Two times a day (BID) | ORAL | 1 refills | Status: DC

## 2017-09-21 MED ORDER — SITAGLIPTIN PHOS-METFORMIN HCL 50-1000 MG PO TABS: 1.0000 | ORAL_TABLET | Freq: Two times a day (BID) | ORAL | 1 refills | Status: DC

## 2017-09-21 NOTE — Progress Notes (Signed)
Subjective:    Patient ID: Patrick Valentine, male    DOB: 04-13-1961, 56 y.o.   MRN: [id number]  HPI  Patrick Valentine is here today for follow up of chronic medical problem.  Outpatient Encounter Prescriptions as of 09/21/2017  Medication Sig  . Blood Glucose Monitoring Suppl (ACCU-CHEK NANO SMARTVIEW) w/Device KIT Use to test BG bid. DX: E11.9  . glucose blood (ACCU-CHEK SMARTVIEW) test strip Test Bid. DX:E11.9  . Lancets (ACCU-CHEK SOFT TOUCH) lancets Test Bid. DXE11.9  . metFORMIN (GLUCOPHAGE) 1000 MG tablet Take 1 tablet (1,000 mg total) by mouth 2 (two) times daily with a meal.  . rosuvastatin (CRESTOR) 20 MG tablet Take 1 tablet (20 mg total) by mouth daily.   No facility-administered encounter medications on file as of 09/21/2017.     1. Type 2 diabetes mellitus not at goal [facility])  Last hgba1c was 7.5%- no changes were made to medications at that time. He does not check blood sugars everyday but says they average around 14 fasting. No hypoglycemia  2. Hyperlipidemia associated with type 2 diabetes mellitus ([place])  He tries to watch diet and exercise  3. Retinitis pigmentosa of both eyes  Saw new eye doctor, Dr. Katy Fitch in august  4. BMI 26.0-26.9,adult  No weight change     New complaints: None today  Social history: retired    Review of Systems  Constitutional: Negative for activity change and appetite change.  HENT: Negative.   Eyes: Negative for pain.  Respiratory: Negative for shortness of breath.   Cardiovascular: Negative for chest pain, palpitations and leg swelling.  Gastrointestinal: Negative for abdominal pain.  Endocrine: Negative for polydipsia.  Genitourinary: Negative.   Skin: Negative for rash.  Neurological: Negative for dizziness, weakness and headaches.  Hematological: Does not bruise/bleed easily.  Psychiatric/Behavioral: Negative.   All other systems reviewed and are negative.      Objective:   Physical Exam  Constitutional: He is oriented  to person, place, and time. He appears well-developed and well-nourished.  HENT:  Head: Normocephalic.  Right Ear: External ear normal.  Left Ear: External ear normal.  Nose: Nose normal.  Mouth/Throat: Oropharynx is clear and moist.  Eyes: Pupils are equal, round, and reactive to light. EOM are normal.  Neck: Normal range of motion. Neck supple. No JVD present. No thyromegaly present.  Cardiovascular: Normal rate, regular rhythm, normal heart sounds and intact distal pulses.  Exam reveals no gallop and no friction rub.   No murmur heard. Pulmonary/Chest: Effort normal and breath sounds normal. No respiratory distress. He has no wheezes. He has no rales. He exhibits no tenderness.  Abdominal: Soft. Bowel sounds are normal. He exhibits no mass. There is no tenderness.  Genitourinary: Prostate normal and penis normal.  Musculoskeletal: Normal range of motion. He exhibits no edema.  Lymphadenopathy:    He has no cervical adenopathy.  Neurological: He is alert and oriented to person, place, and time. No cranial nerve deficit.  Skin: Skin is warm and dry.  Psychiatric: He has a normal mood and affect. His behavior is normal. Judgment and thought content normal.   BP 139/84   Pulse 87   Temp (!) 97.4 F (36.3 C) (Oral)   Ht 5' 9"  (1.753 m)   Wt 179 lb 9.6 oz (81.5 kg)   BMI 26.52 kg/m   hgba1c 8.6%       Assessment & Plan:  1. Type 2 diabetes mellitus not at goal [facility]) Changed from metformin to janumet bid Strict  carb counting - Bayer DCA Hb A1c Waived - sitaGLIPtin-metformin (JANUMET) 50-1000 MG tablet; Take 1 tablet by mouth 2 (two) times daily with a meal.  Dispense: 180 tablet; Refill: 1  2. Hyperlipidemia associated with type 2 diabetes mellitus (HCC) Low fat diet - CMP14+EGFR - Lipid panel - rosuvastatin (CRESTOR) 20 MG tablet; Take 1 tablet (20 mg total) by mouth daily.  Dispense: 90 tablet; Refill: 1  3. Retinitis pigmentosa of both eyes Keep follow up with eye  doctor  4. BMI 26.0-26.9,adult Discussed diet and exercise for person with BMI >25 Will recheck weight in 3-6 months    Labs pending Health maintenance reviewed Diet and exercise encouraged Continue all meds Follow up  In 3 months   [place], FNP

## 2017-09-21 NOTE — Patient Instructions (Signed)

## 2017-09-27 ENCOUNTER — Telehealth: Payer: Self-pay | Admitting: Nurse Practitioner

## 2017-09-28 NOTE — Telephone Encounter (Signed)
Take samples given and find oour from insurance what they will pay for in that class. It is a dpp-4 inhibitor

## 2017-10-11 ENCOUNTER — Encounter: Payer: Self-pay | Admitting: *Deleted

## 2017-10-11 ENCOUNTER — Ambulatory Visit (INDEPENDENT_AMBULATORY_CARE_PROVIDER_SITE_OTHER): Payer: Medicare HMO | Admitting: *Deleted

## 2017-10-11 VITALS — BP 137/84 | HR 94 | Ht 69.5 in | Wt 179.0 lb

## 2017-10-11 DIAGNOSIS — Z0000 Encounter for general adult medical examination without abnormal findings: Secondary | ICD-10-CM | POA: Diagnosis not present

## 2017-10-11 NOTE — Progress Notes (Signed)
Subjective:   Patrick Valentine is a 56 y.o. male who presents for a subsequent Medicare Annual Wellness Visit. Patrick Valentine lives at home with his wife. He is retired on disability due to his vision and hearing impairments.   Review of Systems  Reports that his health is about the same as last year.   Cardiac Risk Factors include: advanced age (>70mn, >>63women);diabetes mellitus;dyslipidemia;male gender  Other systems negative today.    Objective:    BP 137/84 (BP Location: Right Arm, Patient Position: Sitting, Cuff Size: Normal)   Pulse 94   Ht 5' 9.5" (1.765 m)   Wt 179 lb (81.2 kg)   BMI 26.05 kg/m    Current Medications (verified) Outpatient Encounter Prescriptions as of 10/11/2017  Medication Sig  . Blood Glucose Monitoring Suppl (ACCU-CHEK NANO SMARTVIEW) w/Device KIT Use to test BG bid. DX: E11.9  . glucose blood (ACCU-CHEK SMARTVIEW) test strip Test Bid. DX:E11.9  . Lancets (ACCU-CHEK SOFT TOUCH) lancets Test Bid. DXE11.9  . rosuvastatin (CRESTOR) 20 MG tablet Take 1 tablet (20 mg total) by mouth daily.  . sitaGLIPtin-metformin (JANUMET) 50-1000 MG tablet Take 1 tablet by mouth 2 (two) times daily with a meal.   No facility-administered encounter medications on file as of 10/11/2017.     Allergies (verified) Patient has no known allergies.   History: Past Medical History:  Diagnosis Date  . [place]   . Hyperlipidemia    Past Surgical History:  Procedure Laterality Date  . NO PAST SURGERIES     Family History  Problem Relation Age of Onset  . Diabetes Father 30       IDDM  . Diabetes Sister 30       IDDM  . Colon cancer Neg Hx    Social History   Occupational History  . Not on file.   Social History Main Topics  . Smoking status: Never Smoker  . Smokeless tobacco: Never Used  . Alcohol use No  . Drug use: No  . Sexual activity: Yes   Tobacco Counseling No tobacco use  Activities of Daily Living In your  present state of health, do you have any difficulty performing the following activities: 10/11/2017  Hearing? Y  Vision? Y  Comment Tunnel vision and difficulty seeing at night  Difficulty concentrating or making decisions? Y  Comment has noticed some diffuculty with recalling names  Walking or climbing stairs? N  Dressing or bathing? N  Doing errands, shopping? N  Preparing Food and eating ? N  Using the Toilet? N  In the past six months, have you accidently leaked urine? N  Do you have problems with loss of bowel control? N  Managing your Medications? N  Managing your Finances? N  Housekeeping or managing your Housekeeping? N  Some recent data might be hidden    Immunizations and Health Maintenance Immunization History  Administered Date(s) Administered  . Influenza,inj,Quad PF,6+ Mos 10/25/2014, 09/13/2015, 09/08/2016, 09/21/2017  . Pneumococcal Conjugate-13 03/18/2017  . Tdap 11/29/2015   There are no preventive care reminders to display for this patient.  Patient Care Team: MChevis Pretty FNP as PCP - General (Nurse Practitioner) PHilarie FredricksonJLajuan Lines MD as Consulting Physician (Gastroenterology)  No hospitalizations, ER visits, or surgeries this past year.     Assessment:   This is a routine wellness examination for Patrick Valentine  Hearing/Vision screen Patient wears hearing aids and they seem to work well. Eye exam is current.   Dietary issues and exercise  activities discussed: Current Exercise Habits: The patient does not participate in regular exercise at present (Works around yard regularly. If he's not busy physically working then he walks), Type of exercise: walking, Time (Minutes): 60, Frequency (Times/Week): 7, Weekly Exercise (Minutes/Week): 420, Intensity: Moderate, Exercise limited by: None identified  Goals    . Exercise 150 minutes per week (moderate activity)      Depression Screen Depression screen [facility] 2/9 10/11/2017 09/21/2017 06/18/2017 03/18/2017 12/16/2016    Decreased Interest 0 0 0 0 0  Down, Depressed, Hopeless 0 0 0 0 0  PHQ - 2 Score 0 0 0 0 0    Fall Risk Fall Risk  10/11/2017 09/21/2017 06/18/2017 03/18/2017 12/16/2016  Falls in the past year? _0      Cognitive Function: MMSE - Mini Mental State Exam 10/11/2017 10/11/2017  Orientation to time 5 5  Orientation to Place 5 5  Registration 3 3  Attention/ Calculation 5 5  Recall 3 3  Language- name 2 objects 2 2  Language- repeat 1 1  Language- follow 3 step command 3 3  Language- read & follow direction 1 1  Write a sentence 1 1  Copy design 1 -  Total score 30 -        Screening Tests Health Maintenance  Topic Date Due  . HIV Screening  05/02/2018 (Originally 08/27/1976)  . HEMOGLOBIN A1C  03/22/2018  . URINE MICROALBUMIN  06/18/2018  . OPHTHALMOLOGY EXAM  07/13/2018  . FOOT EXAM  09/21/2018  . COLONOSCOPY  09/25/2019  . PNEUMOCOCCAL POLYSACCHARIDE VACCINE (2) 10/26/2019  . TETANUS/TDAP  11/28/2025  . INFLUENZA VACCINE  Completed  . Hepatitis C Screening  Completed        Plan:   Continue to stay active for at least 150 min a week.  Keep f/u with Shelah Lewandowsky in January.  Watch carbohydrate and sugar intake to help control blood sugar.   I have personally reviewed and noted the following in the patient's chart:   . Medical and social history . Use of alcohol, tobacco or illicit drugs  . Current medications and supplements . Functional ability and status . Nutritional status . Physical activity . Advanced directives . List of other physicians . Hospitalizations, surgeries, and ER visits in previous 12 months . Vitals . Screenings to include cognitive, depression, and falls . Referrals and appointments  In addition, I have reviewed and discussed with patient certain preventive protocols, quality metrics, and best practice recommendations. A written personalized care plan for preventive services as well as general preventive health recommendations  were provided to patient.     Chong Sicilian, RN   10/11/2017   I have reviewed and agree with the above AWV documentation.   Mary-Margaret Hassell Done, FNP

## 2017-10-11 NOTE — Patient Instructions (Addendum)
  Mr. Patrick Valentine , Thank you for taking time to come for your Medicare Wellness Visit. I appreciate your ongoing commitment to your health goals. Please review the following plan we discussed and let me know if I can assist you in the future.   These are the goals we discussed: Goals    . Exercise 150 minutes per week (moderate activity)      Continue to stay active daily. Aim for at least 150 min of moderate activity a week.  This is a list of the screening recommended for you and due dates:  Health Maintenance  Topic Date Due  . HIV Screening  05/02/2018*  . Hemoglobin A1C  03/22/2018  . Urine Protein Check  06/18/2018  . Eye exam for diabetics  07/13/2018  . Complete foot exam   09/21/2018  . Colon Cancer Screening  09/25/2019  . Pneumococcal vaccine (2) 10/26/2019  . Tetanus Vaccine  11/28/2025  . Flu Shot  Completed  .  Hepatitis C: One time screening is recommended by Center for Disease Control  (CDC) for  adults born from [birth year] through 1966.   Completed  *Topic was postponed. The date shown is not the original due date.

## 2017-10-20 ENCOUNTER — Telehealth: Payer: Self-pay | Admitting: Nurse Practitioner

## 2017-10-20 DIAGNOSIS — E785 Hyperlipidemia, unspecified: Principal | ICD-10-CM

## 2017-10-20 DIAGNOSIS — E1169 Type 2 diabetes mellitus with other specified complication: Secondary | ICD-10-CM

## 2017-10-20 MED ORDER — ROSUVASTATIN CALCIUM 20 MG PO TABS: 20.0000 mg | ORAL_TABLET | Freq: Every day | ORAL | 1 refills | Status: DC

## 2017-10-20 MED ORDER — ONETOUCH DELICA LANCING DEV MISC: 1 refills | Status: DC

## 2017-10-20 MED ORDER — METFORMIN HCL 1000 MG PO TABS: 1000.0000 mg | ORAL_TABLET | Freq: Two times a day (BID) | ORAL | 1 refills | Status: DC

## 2017-10-20 NOTE — Telephone Encounter (Signed)
Contacted patient and he states that Crestor was sent to [facility] instead of [facility]. He would like this corrected. Also requesting a delica lancet holder so he can check his blood sugar and would like to stop janumet because he can't afford it. Wants to be switched back to metformin 1000 BID until he changes insurance at the first of the year. Sent all request to [facility]. Advised to contact office if he has any further problems

## 2017-10-27 ENCOUNTER — Other Ambulatory Visit: Payer: Self-pay | Admitting: *Deleted

## 2017-10-27 MED ORDER — ACCU-CHEK FASTCLIX LANCETS MISC: 1 refills | Status: DC

## 2017-11-01 ENCOUNTER — Other Ambulatory Visit: Payer: Self-pay

## 2017-11-01 MED ORDER — GLUCOSE BLOOD VI STRP: ORAL_STRIP | 1 refills | Status: DC

## 2017-11-01 MED ORDER — ACCU-CHEK NANO SMARTVIEW W/DEVICE KIT: 1.0000 | PACK | Freq: Once | 0 refills | Status: AC

## 2017-12-20 ENCOUNTER — Ambulatory Visit: Payer: Medicare Other | Admitting: Nurse Practitioner

## 2017-12-20 ENCOUNTER — Encounter: Payer: Self-pay | Admitting: Nurse Practitioner

## 2017-12-20 VITALS — BP 134/89 | HR 80 | Temp 96.7°F | Ht 69.0 in | Wt 178.0 lb

## 2017-12-20 DIAGNOSIS — Z6826 Body mass index (BMI) 26.0-26.9, adult: Secondary | ICD-10-CM

## 2017-12-20 DIAGNOSIS — E119 Type 2 diabetes mellitus without complications: Secondary | ICD-10-CM | POA: Diagnosis not present

## 2017-12-20 DIAGNOSIS — E1169 Type 2 diabetes mellitus with other specified complication: Secondary | ICD-10-CM

## 2017-12-20 DIAGNOSIS — H3552 Pigmentary retinal dystrophy: Secondary | ICD-10-CM

## 2017-12-20 DIAGNOSIS — E785 Hyperlipidemia, unspecified: Secondary | ICD-10-CM | POA: Diagnosis not present

## 2017-12-20 LAB — CMP14+EGFR
ALBUMIN: 4.6 g/dL (ref 3.5–5.5)
ALK PHOS: 51 IU/L (ref 39–117)
ALT: 16 IU/L (ref 0–44)
AST: 17 IU/L (ref 0–40)
Albumin/Globulin Ratio: 1.7 (ref 1.2–2.2)
BUN / CREAT RATIO: 11 (ref 9–20)
BUN: 11 mg/dL (ref 6–24)
Bilirubin Total: 0.3 mg/dL (ref 0.0–1.2)
CO2: 22 mmol/L (ref 20–29)
Calcium: 9.8 mg/dL (ref 8.7–10.2)
Chloride: 97 mmol/L (ref 96–106)
Creatinine, Ser: 0.97 mg/dL (ref 0.76–1.27)
GFR calc Af Amer: 100 mL/min/{1.73_m2} (ref >59)
GFR calc non Af Amer: 86 mL/min/{1.73_m2} (ref >59)
GLOBULIN, TOTAL: 2.7 g/dL (ref 1.5–4.5)
Glucose: 153 mg/dL — ABNORMAL HIGH (ref 65–99)
POTASSIUM: 4.4 mmol/L (ref 3.5–5.2)
SODIUM: 138 mmol/L (ref 134–144)
Total Protein: 7.3 g/dL (ref 6.0–8.5)

## 2017-12-20 LAB — LIPID PANEL
CHOLESTEROL TOTAL: 164 mg/dL (ref 100–199)
Chol/HDL Ratio: 3.9 ratio (ref 0.0–5.0)
HDL: 42 mg/dL (ref >39)
LDL Calculated: 96 mg/dL (ref 0–99)
Triglycerides: 128 mg/dL (ref 0–149)
VLDL Cholesterol Cal: 26 mg/dL (ref 5–40)

## 2017-12-20 LAB — BAYER DCA HB A1C WAIVED: HB A1C (BAYER DCA - WAIVED): 6.9 % (ref <7.0)

## 2017-12-20 MED ORDER — METFORMIN HCL 1000 MG PO TABS: 1000.0000 mg | ORAL_TABLET | Freq: Two times a day (BID) | ORAL | 1 refills | Status: DC

## 2017-12-20 MED ORDER — ROSUVASTATIN CALCIUM 20 MG PO TABS: 20.0000 mg | ORAL_TABLET | Freq: Every day | ORAL | 1 refills | Status: DC

## 2017-12-20 NOTE — Progress Notes (Signed)
Subjective:    Patient ID: Patrick Valentine, male    DOB: 08-02-1961, 57 y.o.   MRN: [id number]  HPI  Patrick Valentine is here today for follow up of chronic medical problem.  Outpatient Encounter Medications as of 12/20/2017  Medication Sig  . ACCU-CHEK FASTCLIX LANCETS MISC Use to check blood glucose twice daily  . Blood Glucose Monitoring Suppl (ACCU-CHEK NANO SMARTVIEW) w/Device KIT Use to test BG bid. DX: E11.9  . glucose blood (ACCU-CHEK SMARTVIEW) test strip Test Bid. DX:E11.9  . Lancet Devices (ONE TOUCH DELICA LANCING DEV) MISC Use to check blood sugar daily  . metFORMIN (GLUCOPHAGE) 1000 MG tablet Take 1 tablet (1,000 mg total) 2 (two) times daily with a meal by mouth.  . rosuvastatin (CRESTOR) 20 MG tablet Take 1 tablet (20 mg total) daily by mouth.  . sitaGLIPtin-metformin (JANUMET) 50-1000 MG tablet Take 1 tablet by mouth 2 (two) times daily with a meal.    1. Type 2 diabetes mellitus not at goal [facility])  Last HGBA1C was 7.5%. He has been trying to exercise daily and watch diet. He does not check blood sugar every day. When he does check it, it is between 140-150.  2. Hyperlipidemia associated with type 2 diabetes mellitus ([place])  Has been trying to watch diet  3. Retinitis pigmentosa of both eyes  Referral was made for eye doctor in July but patient still has not seen one.  4. BMI 26.0-26.9,adult  no recent weight changes    New complaints: none  Social history: Has retired - lives with wife   Review of Systems  Constitutional: Negative for activity change and appetite change.  HENT: Negative.   Eyes: Negative for pain.  Respiratory: Negative for shortness of breath.   Cardiovascular: Negative for chest pain, palpitations and leg swelling.  Gastrointestinal: Negative for abdominal pain.  Endocrine: Negative for polydipsia.  Genitourinary: Negative.   Skin: Negative for rash.  Neurological: Negative for dizziness, weakness and headaches.  Hematological: Does not  bruise/bleed easily.  Psychiatric/Behavioral: Negative.   All other systems reviewed and are negative.      Objective:   Physical Exam  Constitutional: He is oriented to person, place, and time. He appears well-developed and well-nourished.  HENT:  Head: Normocephalic.  Right Ear: External ear normal.  Left Ear: External ear normal.  Nose: Nose normal.  Mouth/Throat: Oropharynx is clear and moist.  Eyes: EOM are normal. Pupils are equal, round, and reactive to light.  Neck: Normal range of motion. Neck supple. No JVD present. No thyromegaly present.  Cardiovascular: Normal rate, regular rhythm, normal heart sounds and intact distal pulses. Exam reveals no gallop and no friction rub.  No murmur heard. Pulmonary/Chest: Effort normal and breath sounds normal. No respiratory distress. He has no wheezes. He has no rales. He exhibits no tenderness.  Abdominal: Soft. Bowel sounds are normal. He exhibits no mass. There is no tenderness.  Genitourinary: Prostate normal and penis normal.  Musculoskeletal: Normal range of motion. He exhibits no edema.  Lymphadenopathy:    He has no cervical adenopathy.  Neurological: He is alert and oriented to person, place, and time. No cranial nerve deficit.  Skin: Skin is warm and dry.  Psychiatric: He has a normal mood and affect. His behavior is normal. Judgment and thought content normal.    BP 134/89   Pulse 80   Temp (!) 96.7 F (35.9 C) (Oral)   Ht _0  (1.753 m)   Wt 178 lb (80.7 kg)  BMI 26.29 kg/m   hgba1c 6.9%    Assessment & Plan:  1. Type 2 diabetes mellitus not at goal [facility]) continjue to watch carbs in diet - Bayer DCA Hb A1c Waived - CMP14+EGFR - metFORMIN (GLUCOPHAGE) 1000 MG tablet; Take 1 tablet (1,000 mg total) by mouth 2 (two) times daily with a meal.  Dispense: 180 tablet; Refill: 1  2. Hyperlipidemia associated with type 2 diabetes mellitus (HCC) Low fat diet - Lipid panel - rosuvastatin (CRESTOR) 20 MG tablet; Take  1 tablet (20 mg total) by mouth daily.  Dispense: 90 tablet; Refill: 1  3. Retinitis pigmentosa of both eyes Make follow up appointment with eye doctor  4. BMI 26.0-26.9,adult Discussed diet and exercise for person with BMI >25 Will recheck weight in 3-6 months     Labs pending Health maintenance reviewed Diet and exercise encouraged Continue all meds Follow up  In 3 months   [place], FNP

## 2017-12-20 NOTE — Patient Instructions (Signed)
Diabetes Mellitus and Nutrition When you have diabetes (diabetes mellitus), it is very important to have healthy eating habits because your blood sugar (glucose) levels are greatly affected by what you eat and drink. Eating healthy foods in the appropriate amounts, at about the same times every day, can help you:  Control your blood glucose.  Lower your risk of heart disease.  Improve your blood pressure.  Reach or maintain a healthy weight.  Every person with diabetes is different, and each person has different needs for a meal plan. Your health care provider may recommend that you work with a diet and nutrition specialist (dietitian) to make a meal plan that is best for you. Your meal plan may vary depending on factors such as:  The calories you need.  The medicines you take.  Your weight.  Your blood glucose, blood pressure, and cholesterol levels.  Your activity level.  Other health conditions you have, such as heart or kidney disease.  How do carbohydrates affect me? Carbohydrates affect your blood glucose level more than any other type of food. Eating carbohydrates naturally increases the amount of glucose in your blood. Carbohydrate counting is a method for keeping track of how many carbohydrates you eat. Counting carbohydrates is important to keep your blood glucose at a healthy level, especially if you use insulin or take certain oral diabetes medicines. It is important to know how many carbohydrates you can safely have in each meal. This is different for every person. Your dietitian can help you calculate how many carbohydrates you should have at each meal and for snack. Foods that contain carbohydrates include:  Bread, cereal, rice, pasta, and crackers.  Potatoes and corn.  Peas, beans, and lentils.  Milk and yogurt.  Fruit and juice.  Desserts, such as cakes, cookies, ice cream, and candy.  How does alcohol affect me? Alcohol can cause a sudden decrease in blood  glucose (hypoglycemia), especially if you use insulin or take certain oral diabetes medicines. Hypoglycemia can be a life-threatening condition. Symptoms of hypoglycemia (sleepiness, dizziness, and confusion) are similar to symptoms of having too much alcohol. If your health care provider says that alcohol is safe for you, follow these guidelines:  Limit alcohol intake to no more than 1 drink per day for nonpregnant women and 2 drinks per day for men. One drink equals 12 oz of beer, 5 oz of wine, or 1 oz of hard liquor.  Do not drink on an empty stomach.  Keep yourself hydrated with water, diet soda, or unsweetened iced tea.  Keep in mind that regular soda, juice, and other mixers may contain a lot of sugar and must be counted as carbohydrates.  What are tips for following this plan? Reading food labels  Start by checking the serving size on the label. The amount of calories, carbohydrates, fats, and other nutrients listed on the label are based on one serving of the food. Many foods contain more than one serving per package.  Check the total grams (g) of carbohydrates in one serving. You can calculate the number of servings of carbohydrates in one serving by dividing the total carbohydrates by 15. For example, if a food has 30 g of total carbohydrates, it would be equal to 2 servings of carbohydrates.  Check the number of grams (g) of saturated and trans fats in one serving. Choose foods that have low or no amount of these fats.  Check the number of milligrams (mg) of sodium in one serving. Most people   should limit total sodium intake to less than 2,300 mg per day.  Always check the nutrition information of foods labeled as "low-fat" or "nonfat". These foods may be higher in added sugar or refined carbohydrates and should be avoided.  Talk to your dietitian to identify your daily goals for nutrients listed on the label. Shopping  Avoid buying canned, premade, or processed foods. These  foods tend to be high in fat, sodium, and added sugar.  Shop around the outside edge of the grocery store. This includes fresh fruits and vegetables, bulk grains, fresh meats, and fresh dairy. Cooking  Use low-heat cooking methods, such as baking, instead of high-heat cooking methods like deep frying.  Cook using healthy oils, such as olive, canola, or sunflower oil.  Avoid cooking with butter, cream, or high-fat meats. Meal planning  Eat meals and snacks regularly, preferably at the same times every day. Avoid going long periods of time without eating.  Eat foods high in fiber, such as fresh fruits, vegetables, beans, and whole grains. Talk to your dietitian about how many servings of carbohydrates you can eat at each meal.  Eat 4-6 ounces of lean protein each day, such as lean meat, chicken, fish, eggs, or tofu. 1 ounce is equal to 1 ounce of meat, chicken, or fish, 1 egg, or 1/4 cup of tofu.  Eat some foods each day that contain healthy fats, such as avocado, nuts, seeds, and fish. Lifestyle   Check your blood glucose regularly.  Exercise at least 30 minutes 5 or more days each week, or as told by your health care provider.  Take medicines as told by your health care provider.  Do not use any products that contain nicotine or tobacco, such as cigarettes and e-cigarettes. If you need help quitting, ask your health care provider.  Work with a counselor or diabetes educator to identify strategies to manage stress and any emotional and social challenges. What are some questions to ask my health care provider?  Do I need to meet with a diabetes educator?  Do I need to meet with a dietitian?  What number can I call if I have questions?  When are the best times to check my blood glucose? Where to find more information:  American Diabetes Association: diabetes.org/food-and-fitness/food  Academy of Nutrition and Dietetics:  www.eatright.org/resources/health/diseases-and-conditions/diabetes  National Institute of Diabetes and Digestive and Kidney Diseases (NIH): www.niddk.nih.gov/health-information/diabetes/overview/diet-eating-physical-activity Summary  A healthy meal plan will help you control your blood glucose and maintain a healthy lifestyle.  Working with a diet and nutrition specialist (dietitian) can help you make a meal plan that is best for you.  Keep in mind that carbohydrates and alcohol have immediate effects on your blood glucose levels. It is important to count carbohydrates and to use alcohol carefully. This information is not intended to replace advice given to you by your health care provider. Make sure you discuss any questions you have with your health care provider. Document Released: 08/27/2005 Document Revised: 01/04/2017 Document Reviewed: 01/04/2017 Elsevier Interactive Patient Education  2018 Elsevier Inc.  

## 2018-01-27 ENCOUNTER — Ambulatory Visit: Payer: Medicare Other

## 2018-03-22 ENCOUNTER — Ambulatory Visit: Payer: Medicare Other | Admitting: Nurse Practitioner

## 2018-03-22 ENCOUNTER — Encounter: Payer: Self-pay | Admitting: Nurse Practitioner

## 2018-03-22 VITALS — BP 127/85 | HR 96 | Temp 97.0°F | Ht 69.0 in | Wt 180.0 lb

## 2018-03-22 DIAGNOSIS — H3552 Pigmentary retinal dystrophy: Secondary | ICD-10-CM

## 2018-03-22 DIAGNOSIS — E785 Hyperlipidemia, unspecified: Secondary | ICD-10-CM

## 2018-03-22 DIAGNOSIS — E119 Type 2 diabetes mellitus without complications: Secondary | ICD-10-CM | POA: Diagnosis not present

## 2018-03-22 DIAGNOSIS — E1169 Type 2 diabetes mellitus with other specified complication: Secondary | ICD-10-CM | POA: Diagnosis not present

## 2018-03-22 DIAGNOSIS — Z6826 Body mass index (BMI) 26.0-26.9, adult: Secondary | ICD-10-CM

## 2018-03-22 LAB — BAYER DCA HB A1C WAIVED: HB A1C (BAYER DCA - WAIVED): 7.5 % — ABNORMAL HIGH (ref <7.0)

## 2018-03-22 MED ORDER — ROSUVASTATIN CALCIUM 20 MG PO TABS: 20.0000 mg | ORAL_TABLET | Freq: Every day | ORAL | 1 refills | Status: DC

## 2018-03-22 MED ORDER — METFORMIN HCL 1000 MG PO TABS: 1000.0000 mg | ORAL_TABLET | Freq: Two times a day (BID) | ORAL | 1 refills | Status: DC

## 2018-03-22 NOTE — Progress Notes (Signed)
Subjective:    Patient ID: Patrick Valentine, male    DOB: 1961/10/25, 57 y.o.   MRN: [id number]  HPI  Patrick Valentine is here today for follow up of chronic medical problem.  Outpatient Encounter Medications as of 03/22/2018  Medication Sig  . ACCU-CHEK FASTCLIX LANCETS MISC Use to check blood glucose twice daily  . Blood Glucose Monitoring Suppl (ACCU-CHEK NANO SMARTVIEW) w/Device KIT Use to test BG bid. DX: E11.9  . glucose blood (ACCU-CHEK SMARTVIEW) test strip Test Bid. DX:E11.9  . Lancet Devices (ONE TOUCH DELICA LANCING DEV) MISC Use to check blood sugar daily  . metFORMIN (GLUCOPHAGE) 1000 MG tablet Take 1 tablet (1,000 mg total) by mouth 2 (two) times daily with a meal.  . rosuvastatin (CRESTOR) 20 MG tablet Take 1 tablet (20 mg total) by mouth daily.     1. Type 2 diabetes mellitus not at goal [facility])  Last HGBA1C was 6.9%. He checks his blood sugars about 3x a week.. Running around 130-150 most of the tome.  2. Hyperlipidemia associated with type 2 diabetes mellitus ([place])  Does watch diet  3. BMI 26.0-26.9,adult  No recent weight changes  4. Retinitis pigmentosa of both eyes  lhas eye doctors apointment May 1, 2019p     New complaints: none  Social history: Lives with wife. He has retired from work. Has 2 granddaughters  * he is currently having his tattoos on bil forearms removed.  Review of Systems  Constitutional: Negative for activity change and appetite change.  HENT: Negative.   Eyes: Negative for pain.  Respiratory: Negative for shortness of breath.   Cardiovascular: Negative for chest pain, palpitations and leg swelling.  Gastrointestinal: Negative for abdominal pain.  Endocrine: Negative for polydipsia.  Genitourinary: Negative.   Skin: Negative for rash.  Neurological: Negative for dizziness, weakness and headaches.  Hematological: Does not bruise/bleed easily.  Psychiatric/Behavioral: Negative.   All other systems reviewed and are negative.        Objective:   Physical Exam  Constitutional: He is oriented to person, place, and time. He appears well-developed and well-nourished.  HENT:  Head: Normocephalic.  Right Ear: External ear normal.  Left Ear: External ear normal.  Nose: Nose normal.  Mouth/Throat: Oropharynx is clear and moist.  Eyes: Pupils are equal, round, and reactive to light. EOM are normal.  Neck: Normal range of motion. Neck supple. No JVD present. No thyromegaly present.  Cardiovascular: Normal rate, regular rhythm, normal heart sounds and intact distal pulses. Exam reveals no gallop and no friction rub.  No murmur heard. Pulmonary/Chest: Effort normal and breath sounds normal. No respiratory distress. He has no wheezes. He has no rales. He exhibits no tenderness.  Abdominal: Soft. Bowel sounds are normal. He exhibits no mass. There is no tenderness.  Genitourinary: Prostate normal and penis normal.  Musculoskeletal: Normal range of motion. He exhibits no edema.  Lymphadenopathy:    He has no cervical adenopathy.  Neurological: He is alert and oriented to person, place, and time. No cranial nerve deficit.  Skin: Skin is warm and dry.  Psychiatric: He has a normal mood and affect. His behavior is normal. Judgment and thought content normal.   BP 127/85   Pulse 96   Temp (!) 97 F (36.1 C) (Oral)   Ht _0  (1.753 m)   Wt 180 lb (81.6 kg)   BMI 26.58 kg/m         Assessment & Plan:  1. Type 2 diabetes mellitus not at  goal ([place]) Continue to watch carbs in diet - Bayer DCA Hb A1c Waived - CMP14+EGFR - metFORMIN (GLUCOPHAGE) 1000 MG tablet; Take 1 tablet (1,000 mg total) by mouth 2 (two) times daily with a meal.  Dispense: 180 tablet; Refill: 1  2. Hyperlipidemia associated with type 2 diabetes mellitus (HCC) Low fat diet - Lipid panel - rosuvastatin (CRESTOR) 20 MG tablet; Take 1 tablet (20 mg total) by mouth daily.  Dispense: 90 tablet; Refill: 1  3. BMI 26.0-26.9,adult Discussed diet and  exercise for person with BMI >25 Will recheck weight in 3-6 months  4. Retinitis pigmentosa of both eyes Keep appointment with eye doctor.    Labs pending Health maintenance reviewed Diet and exercise encouraged Continue all meds Follow up  In 3 month   [place], FNP

## 2018-03-22 NOTE — Patient Instructions (Signed)

## 2018-03-23 LAB — CMP14+EGFR
ALT: 22 IU/L (ref 0–44)
AST: 17 IU/L (ref 0–40)
Albumin/Globulin Ratio: 1.9 (ref 1.2–2.2)
Albumin: 4.7 g/dL (ref 3.5–5.5)
Alkaline Phosphatase: 57 IU/L (ref 39–117)
BUN/Creatinine Ratio: 13 (ref 9–20)
BUN: 11 mg/dL (ref 6–24)
Bilirubin Total: 0.5 mg/dL (ref 0.0–1.2)
CO2: 22 mmol/L (ref 20–29)
Calcium: 10 mg/dL (ref 8.7–10.2)
Chloride: 95 mmol/L — ABNORMAL LOW (ref 96–106)
Creatinine, Ser: 0.88 mg/dL (ref 0.76–1.27)
GFR calc Af Amer: 110 mL/min/{1.73_m2} (ref >59)
GFR calc non Af Amer: 95 mL/min/{1.73_m2} (ref >59)
Globulin, Total: 2.5 g/dL (ref 1.5–4.5)
Glucose: 218 mg/dL — ABNORMAL HIGH (ref 65–99)
Potassium: 4.5 mmol/L (ref 3.5–5.2)
Sodium: 135 mmol/L (ref 134–144)
Total Protein: 7.2 g/dL (ref 6.0–8.5)

## 2018-03-23 LAB — LIPID PANEL
Chol/HDL Ratio: 4.1 ratio (ref 0.0–5.0)
Cholesterol, Total: 171 mg/dL (ref 100–199)
HDL: 42 mg/dL (ref >39)
LDL Calculated: 99 mg/dL (ref 0–99)
Triglycerides: 148 mg/dL (ref 0–149)
VLDL Cholesterol Cal: 30 mg/dL (ref 5–40)

## 2018-04-13 LAB — HM DIABETES EYE EXAM

## 2018-05-03 DIAGNOSIS — E119 Type 2 diabetes mellitus without complications: Secondary | ICD-10-CM | POA: Insufficient documentation

## 2018-05-03 DIAGNOSIS — E1169 Type 2 diabetes mellitus with other specified complication: Secondary | ICD-10-CM | POA: Insufficient documentation

## 2018-06-23 ENCOUNTER — Ambulatory Visit: Payer: Medicare Other | Admitting: Nurse Practitioner

## 2018-06-23 ENCOUNTER — Encounter: Payer: Self-pay | Admitting: Nurse Practitioner

## 2018-06-23 VITALS — BP 124/82 | HR 88 | Temp 96.8°F | Ht 69.0 in | Wt 174.0 lb

## 2018-06-23 DIAGNOSIS — E785 Hyperlipidemia, unspecified: Secondary | ICD-10-CM

## 2018-06-23 DIAGNOSIS — Z6826 Body mass index (BMI) 26.0-26.9, adult: Secondary | ICD-10-CM

## 2018-06-23 DIAGNOSIS — E1169 Type 2 diabetes mellitus with other specified complication: Secondary | ICD-10-CM | POA: Diagnosis not present

## 2018-06-23 DIAGNOSIS — H3552 Pigmentary retinal dystrophy: Secondary | ICD-10-CM | POA: Diagnosis not present

## 2018-06-23 DIAGNOSIS — E119 Type 2 diabetes mellitus without complications: Secondary | ICD-10-CM

## 2018-06-23 LAB — CMP14+EGFR
ALBUMIN: 4.8 g/dL (ref 3.5–5.5)
ALT: 19 IU/L (ref 0–44)
AST: 19 IU/L (ref 0–40)
Albumin/Globulin Ratio: 1.9 (ref 1.2–2.2)
Alkaline Phosphatase: 63 IU/L (ref 39–117)
BUN / CREAT RATIO: 13 (ref 9–20)
BUN: 13 mg/dL (ref 6–24)
Bilirubin Total: 0.9 mg/dL (ref 0.0–1.2)
CALCIUM: 10.4 mg/dL — AB (ref 8.7–10.2)
CO2: 25 mmol/L (ref 20–29)
CREATININE: 1.04 mg/dL (ref 0.76–1.27)
Chloride: 96 mmol/L (ref 96–106)
GFR, EST AFRICAN AMERICAN: 92 mL/min/{1.73_m2} (ref >59)
GFR, EST NON AFRICAN AMERICAN: 79 mL/min/{1.73_m2} (ref >59)
Globulin, Total: 2.5 g/dL (ref 1.5–4.5)
Glucose: 192 mg/dL — ABNORMAL HIGH (ref 65–99)
Potassium: 5.4 mmol/L — ABNORMAL HIGH (ref 3.5–5.2)
SODIUM: 138 mmol/L (ref 134–144)
TOTAL PROTEIN: 7.3 g/dL (ref 6.0–8.5)

## 2018-06-23 LAB — LIPID PANEL
CHOL/HDL RATIO: 4.2 ratio (ref 0.0–5.0)
CHOLESTEROL TOTAL: 177 mg/dL (ref 100–199)
HDL: 42 mg/dL (ref >39)
LDL CALC: 107 mg/dL — AB (ref 0–99)
Triglycerides: 142 mg/dL (ref 0–149)
VLDL CHOLESTEROL CAL: 28 mg/dL (ref 5–40)

## 2018-06-23 LAB — BAYER DCA HB A1C WAIVED: HB A1C (BAYER DCA - WAIVED): 8.7 % — ABNORMAL HIGH (ref <7.0)

## 2018-06-23 MED ORDER — DAPAGLIFLOZIN PROPANEDIOL 5 MG PO TABS: 5.0000 mg | ORAL_TABLET | Freq: Every day | ORAL | 0 refills | Status: DC

## 2018-06-23 MED ORDER — ROSUVASTATIN CALCIUM 20 MG PO TABS: 20.0000 mg | ORAL_TABLET | Freq: Every day | ORAL | 1 refills | Status: DC

## 2018-06-23 MED ORDER — METFORMIN HCL 1000 MG PO TABS: 1000.0000 mg | ORAL_TABLET | Freq: Two times a day (BID) | ORAL | 1 refills | Status: DC

## 2018-06-23 NOTE — Patient Instructions (Signed)
Diabetes Mellitus and Nutrition When you have diabetes (diabetes mellitus), it is very important to have healthy eating habits because your blood sugar (glucose) levels are greatly affected by what you eat and drink. Eating healthy foods in the appropriate amounts, at about the same times every day, can help you:  Control your blood glucose.  Lower your risk of heart disease.  Improve your blood pressure.  Reach or maintain a healthy weight.  Every person with diabetes is different, and each person has different needs for a meal plan. Your health care provider may recommend that you work with a diet and nutrition specialist (dietitian) to make a meal plan that is best for you. Your meal plan may vary depending on factors such as:  The calories you need.  The medicines you take.  Your weight.  Your blood glucose, blood pressure, and cholesterol levels.  Your activity level.  Other health conditions you have, such as heart or kidney disease.  How do carbohydrates affect me? Carbohydrates affect your blood glucose level more than any other type of food. Eating carbohydrates naturally increases the amount of glucose in your blood. Carbohydrate counting is a method for keeping track of how many carbohydrates you eat. Counting carbohydrates is important to keep your blood glucose at a healthy level, especially if you use insulin or take certain oral diabetes medicines. It is important to know how many carbohydrates you can safely have in each meal. This is different for every person. Your dietitian can help you calculate how many carbohydrates you should have at each meal and for snack. Foods that contain carbohydrates include:  Bread, cereal, rice, pasta, and crackers.  Potatoes and corn.  Peas, beans, and lentils.  Milk and yogurt.  Fruit and juice.  Desserts, such as cakes, cookies, ice cream, and candy.  How does alcohol affect me? Alcohol can cause a sudden decrease in blood  glucose (hypoglycemia), especially if you use insulin or take certain oral diabetes medicines. Hypoglycemia can be a life-threatening condition. Symptoms of hypoglycemia (sleepiness, dizziness, and confusion) are similar to symptoms of having too much alcohol. If your health care provider says that alcohol is safe for you, follow these guidelines:  Limit alcohol intake to no more than 1 drink per day for nonpregnant women and 2 drinks per day for men. One drink equals 12 oz of beer, 5 oz of wine, or 1 oz of hard liquor.  Do not drink on an empty stomach.  Keep yourself hydrated with water, diet soda, or unsweetened iced tea.  Keep in mind that regular soda, juice, and other mixers may contain a lot of sugar and must be counted as carbohydrates.  What are tips for following this plan? Reading food labels  Start by checking the serving size on the label. The amount of calories, carbohydrates, fats, and other nutrients listed on the label are based on one serving of the food. Many foods contain more than one serving per package.  Check the total grams (g) of carbohydrates in one serving. You can calculate the number of servings of carbohydrates in one serving by dividing the total carbohydrates by 15. For example, if a food has 30 g of total carbohydrates, it would be equal to 2 servings of carbohydrates.  Check the number of grams (g) of saturated and trans fats in one serving. Choose foods that have low or no amount of these fats.  Check the number of milligrams (mg) of sodium in one serving. Most people   should limit total sodium intake to less than 2,300 mg per day.  Always check the nutrition information of foods labeled as "low-fat" or "nonfat". These foods may be higher in added sugar or refined carbohydrates and should be avoided.  Talk to your dietitian to identify your daily goals for nutrients listed on the label. Shopping  Avoid buying canned, premade, or processed foods. These  foods tend to be high in fat, sodium, and added sugar.  Shop around the outside edge of the grocery store. This includes fresh fruits and vegetables, bulk grains, fresh meats, and fresh dairy. Cooking  Use low-heat cooking methods, such as baking, instead of high-heat cooking methods like deep frying.  Cook using healthy oils, such as olive, canola, or sunflower oil.  Avoid cooking with butter, cream, or high-fat meats. Meal planning  Eat meals and snacks regularly, preferably at the same times every day. Avoid going long periods of time without eating.  Eat foods high in fiber, such as fresh fruits, vegetables, beans, and whole grains. Talk to your dietitian about how many servings of carbohydrates you can eat at each meal.  Eat 4-6 ounces of lean protein each day, such as lean meat, chicken, fish, eggs, or tofu. 1 ounce is equal to 1 ounce of meat, chicken, or fish, 1 egg, or 1/4 cup of tofu.  Eat some foods each day that contain healthy fats, such as avocado, nuts, seeds, and fish. Lifestyle   Check your blood glucose regularly.  Exercise at least 30 minutes 5 or more days each week, or as told by your health care provider.  Take medicines as told by your health care provider.  Do not use any products that contain nicotine or tobacco, such as cigarettes and e-cigarettes. If you need help quitting, ask your health care provider.  Work with a counselor or diabetes educator to identify strategies to manage stress and any emotional and social challenges. What are some questions to ask my health care provider?  Do I need to meet with a diabetes educator?  Do I need to meet with a dietitian?  What number can I call if I have questions?  When are the best times to check my blood glucose? Where to find more information:  American Diabetes Association: diabetes.org/food-and-fitness/food  Academy of Nutrition and Dietetics:  www.eatright.org/resources/health/diseases-and-conditions/diabetes  National Institute of Diabetes and Digestive and Kidney Diseases (NIH): www.niddk.nih.gov/health-information/diabetes/overview/diet-eating-physical-activity Summary  A healthy meal plan will help you control your blood glucose and maintain a healthy lifestyle.  Working with a diet and nutrition specialist (dietitian) can help you make a meal plan that is best for you.  Keep in mind that carbohydrates and alcohol have immediate effects on your blood glucose levels. It is important to count carbohydrates and to use alcohol carefully. This information is not intended to replace advice given to you by your health care provider. Make sure you discuss any questions you have with your health care provider. Document Released: 08/27/2005 Document Revised: 01/04/2017 Document Reviewed: 01/04/2017 Elsevier Interactive Patient Education  2018 Elsevier Inc.  

## 2018-06-23 NOTE — Progress Notes (Signed)
Subjective:    Patient ID: Patrick Valentine, male    DOB: December 30, 1960, 57 y.o.   MRN: [id number]   Chief Complaint: Medical Management of Chronic Issues   HPI:  1. Hyperlipidemia associated with type 2 diabetes mellitus ([place])  Tries to watch diet. Does exercise 3-4x a week.  2. Type 2 diabetes mellitus not at goal [facility])  Last hgba1c was 7.5%. He has been watching diet. He has not ben checking blood sugars.  3. Retinitis pigmentosa of both eyes  Last eye doctor appointment was in June . Stable right now.  4. BMI 26.0-26.9,adult  No recent weight changes.    Outpatient Encounter Medications as of 06/23/2018  Medication Sig  . ACCU-CHEK FASTCLIX LANCETS MISC Use to check blood glucose twice daily  . Blood Glucose Monitoring Suppl (ACCU-CHEK NANO SMARTVIEW) w/Device KIT Use to test BG bid. DX: E11.9  . glucose blood (ACCU-CHEK SMARTVIEW) test strip Test Bid. DX:E11.9  . Lancet Devices (ONE TOUCH DELICA LANCING DEV) MISC Use to check blood sugar daily  . metFORMIN (GLUCOPHAGE) 1000 MG tablet Take 1 tablet (1,000 mg total) by mouth 2 (two) times daily with a meal.  . rosuvastatin (CRESTOR) 20 MG tablet Take 1 tablet (20 mg total) by mouth daily.      New complaints: None today  Social history: [place]. Stays busy with working around house. Has 2 granddaughters that he like to go visit.   Review of Systems  Constitutional: Negative for activity change and appetite change.  HENT: Negative.   Eyes: Negative for pain.  Respiratory: Negative for shortness of breath.   Cardiovascular: Negative for chest pain, palpitations and leg swelling.  Gastrointestinal: Negative for abdominal pain.  Endocrine: Negative for polydipsia.  Genitourinary: Negative.   Skin: Negative for rash.  Neurological: Negative for dizziness, weakness and headaches.  Hematological: Does not bruise/bleed easily.  Psychiatric/Behavioral: Negative.   All other systems reviewed and are negative.        Objective:   Physical Exam  Constitutional: He is oriented to person, place, and time. He appears well-developed and well-nourished. No distress.  HENT:  Head: Normocephalic.  Nose: Nose normal.  Mouth/Throat: Oropharynx is clear and moist.  Eyes: Pupils are equal, round, and reactive to light. EOM are normal.  Neck: Normal range of motion and phonation normal. Neck supple. No JVD present. Carotid bruit is not present. No thyroid mass and no thyromegaly present.  Cardiovascular: Normal rate and regular rhythm.  Pulmonary/Chest: Effort normal and breath sounds normal. No respiratory distress.  Abdominal: Soft. Normal appearance, normal aorta and bowel sounds are normal. There is no tenderness.  Musculoskeletal: Normal range of motion.  Lymphadenopathy:    He has no cervical adenopathy.  Neurological: He is alert and oriented to person, place, and time.  Skin: Skin is warm and dry.  Psychiatric: He has a normal mood and affect. His behavior is normal. Judgment and thought content normal.   BP 124/82   Pulse 88   Temp (!) 96.8 F (36 C) (Oral)   Ht 5' 9"  (1.753 m)   Wt 174 lb (78.9 kg)   BMI 25.70 kg/m   hgba1c 8.7%    Assessment & Plan:  Ledon Weihe comes in today with chief complaint of Medical Management of Chronic Issues   Diagnosis and orders addressed:  1. Hyperlipidemia associated with type 2 diabetes mellitus (HCC) Low fat diet - Lipid panel - rosuvastatin (CRESTOR) 20 MG tablet; Take 1 tablet (20 mg total) by mouth daily.  Dispense: 90 tablet; Refill: 1  2. Type 2 diabetes mellitus not at goal [facility]) Stricter carb counting Added farxiga 50m daily - Bayer DCA Hb A1c Waived - CMP14+EGFR - metFORMIN (GLUCOPHAGE) 1000 MG tablet; Take 1 tablet (1,000 mg total) by mouth 2 (two) times daily with a meal.  Dispense: 180 tablet; Refill: 1 - dapagliflozin propanediol (FARXIGA) 5 MG TABS tablet; Take 5 mg by mouth daily.  Dispense: 30 tablet; Refill: 0  3. Retinitis  pigmentosa of both eyes keep follow up wth eye doctor  4. BMI 26.0-26.9,adult Discussed diet and exercise for person with BMI >25 Will recheck weight in 3-6 months    Labs pending Health Maintenance reviewed Diet and exercise encouraged  Follow up plan: 1 month diabetes only   Mary-Margaret MHassell Done FNP

## 2018-07-21 ENCOUNTER — Telehealth: Payer: Self-pay | Admitting: Nurse Practitioner

## 2018-07-26 ENCOUNTER — Encounter: Payer: Self-pay | Admitting: Nurse Practitioner

## 2018-07-26 ENCOUNTER — Ambulatory Visit: Payer: Medicare Other | Admitting: Nurse Practitioner

## 2018-07-26 VITALS — BP 122/81 | HR 87 | Temp 96.9°F | Ht 69.0 in | Wt 172.0 lb

## 2018-07-26 DIAGNOSIS — E119 Type 2 diabetes mellitus without complications: Secondary | ICD-10-CM

## 2018-07-26 LAB — BAYER DCA HB A1C WAIVED: HB A1C (BAYER DCA - WAIVED): 7.5 % — ABNORMAL HIGH (ref <7.0)

## 2018-07-26 NOTE — Patient Instructions (Signed)
Diabetes Mellitus and Nutrition When you have diabetes (diabetes mellitus), it is very important to have healthy eating habits because your blood sugar (glucose) levels are greatly affected by what you eat and drink. Eating healthy foods in the appropriate amounts, at about the same times every day, can help you:  Control your blood glucose.  Lower your risk of heart disease.  Improve your blood pressure.  Reach or maintain a healthy weight.  Every person with diabetes is different, and each person has different needs for a meal plan. Your health care provider may recommend that you work with a diet and nutrition specialist (dietitian) to make a meal plan that is best for you. Your meal plan may vary depending on factors such as:  The calories you need.  The medicines you take.  Your weight.  Your blood glucose, blood pressure, and cholesterol levels.  Your activity level.  Other health conditions you have, such as heart or kidney disease.  How do carbohydrates affect me? Carbohydrates affect your blood glucose level more than any other type of food. Eating carbohydrates naturally increases the amount of glucose in your blood. Carbohydrate counting is a method for keeping track of how many carbohydrates you eat. Counting carbohydrates is important to keep your blood glucose at a healthy level, especially if you use insulin or take certain oral diabetes medicines. It is important to know how many carbohydrates you can safely have in each meal. This is different for every person. Your dietitian can help you calculate how many carbohydrates you should have at each meal and for snack. Foods that contain carbohydrates include:  Bread, cereal, rice, pasta, and crackers.  Potatoes and corn.  Peas, beans, and lentils.  Milk and yogurt.  Fruit and juice.  Desserts, such as cakes, cookies, ice cream, and candy.  How does alcohol affect me? Alcohol can cause a sudden decrease in blood  glucose (hypoglycemia), especially if you use insulin or take certain oral diabetes medicines. Hypoglycemia can be a life-threatening condition. Symptoms of hypoglycemia (sleepiness, dizziness, and confusion) are similar to symptoms of having too much alcohol. If your health care provider says that alcohol is safe for you, follow these guidelines:  Limit alcohol intake to no more than 1 drink per day for nonpregnant women and 2 drinks per day for men. One drink equals 12 oz of beer, 5 oz of wine, or 1 oz of hard liquor.  Do not drink on an empty stomach.  Keep yourself hydrated with water, diet soda, or unsweetened iced tea.  Keep in mind that regular soda, juice, and other mixers may contain a lot of sugar and must be counted as carbohydrates.  What are tips for following this plan? Reading food labels  Start by checking the serving size on the label. The amount of calories, carbohydrates, fats, and other nutrients listed on the label are based on one serving of the food. Many foods contain more than one serving per package.  Check the total grams (g) of carbohydrates in one serving. You can calculate the number of servings of carbohydrates in one serving by dividing the total carbohydrates by 15. For example, if a food has 30 g of total carbohydrates, it would be equal to 2 servings of carbohydrates.  Check the number of grams (g) of saturated and trans fats in one serving. Choose foods that have low or no amount of these fats.  Check the number of milligrams (mg) of sodium in one serving. Most people   should limit total sodium intake to less than 2,300 mg per day.  Always check the nutrition information of foods labeled as "low-fat" or "nonfat". These foods may be higher in added sugar or refined carbohydrates and should be avoided.  Talk to your dietitian to identify your daily goals for nutrients listed on the label. Shopping  Avoid buying canned, premade, or processed foods. These  foods tend to be high in fat, sodium, and added sugar.  Shop around the outside edge of the grocery store. This includes fresh fruits and vegetables, bulk grains, fresh meats, and fresh dairy. Cooking  Use low-heat cooking methods, such as baking, instead of high-heat cooking methods like deep frying.  Cook using healthy oils, such as olive, canola, or sunflower oil.  Avoid cooking with butter, cream, or high-fat meats. Meal planning  Eat meals and snacks regularly, preferably at the same times every day. Avoid going long periods of time without eating.  Eat foods high in fiber, such as fresh fruits, vegetables, beans, and whole grains. Talk to your dietitian about how many servings of carbohydrates you can eat at each meal.  Eat 4-6 ounces of lean protein each day, such as lean meat, chicken, fish, eggs, or tofu. 1 ounce is equal to 1 ounce of meat, chicken, or fish, 1 egg, or 1/4 cup of tofu.  Eat some foods each day that contain healthy fats, such as avocado, nuts, seeds, and fish. Lifestyle   Check your blood glucose regularly.  Exercise at least 30 minutes 5 or more days each week, or as told by your health care provider.  Take medicines as told by your health care provider.  Do not use any products that contain nicotine or tobacco, such as cigarettes and e-cigarettes. If you need help quitting, ask your health care provider.  Work with a counselor or diabetes educator to identify strategies to manage stress and any emotional and social challenges. What are some questions to ask my health care provider?  Do I need to meet with a diabetes educator?  Do I need to meet with a dietitian?  What number can I call if I have questions?  When are the best times to check my blood glucose? Where to find more information:  American Diabetes Association: diabetes.org/food-and-fitness/food  Academy of Nutrition and Dietetics:  www.eatright.org/resources/health/diseases-and-conditions/diabetes  National Institute of Diabetes and Digestive and Kidney Diseases (NIH): www.niddk.nih.gov/health-information/diabetes/overview/diet-eating-physical-activity Summary  A healthy meal plan will help you control your blood glucose and maintain a healthy lifestyle.  Working with a diet and nutrition specialist (dietitian) can help you make a meal plan that is best for you.  Keep in mind that carbohydrates and alcohol have immediate effects on your blood glucose levels. It is important to count carbohydrates and to use alcohol carefully. This information is not intended to replace advice given to you by your health care provider. Make sure you discuss any questions you have with your health care provider. Document Released: 08/27/2005 Document Revised: 01/04/2017 Document Reviewed: 01/04/2017 Elsevier Interactive Patient Education  2018 Elsevier Inc.  

## 2018-07-26 NOTE — Progress Notes (Signed)
   Subjective:    Patient ID: Patrick Valentine, male    DOB: 23-Oct-1961, 57 y.o.   MRN: [id number]   Chief Complaint: Recheck HgB A1c   HPI Patient comes in today for follow up of HGBA1c . He was seen for follow up of chronic medical issues on 06/23/18. At that time he had not been watching diet very closely and had not been checking blood sugars. No changes were made to medication. Fasting blood sugar was 152 this morning. He says he has been exercising on treadmill again.    Review of Systems  Constitutional: Negative.   HENT: Negative.   Respiratory: Negative for cough and shortness of breath.   Cardiovascular: Negative.   Gastrointestinal: Negative.   Genitourinary: Negative.   Musculoskeletal: Negative.   Neurological: Positive for headaches.  Psychiatric/Behavioral: Negative.   All other systems reviewed and are negative.      Objective:   Physical Exam  Constitutional: He is oriented to person, place, and time. He appears well-developed and well-nourished. No distress.  Cardiovascular: Normal rate.  Pulmonary/Chest: Effort normal and breath sounds normal.  Neurological: He is alert and oriented to person, place, and time.  Skin: Skin is warm and dry.   BP 122/81   Pulse 87   Temp (!) 96.9 F (36.1 C) (Oral)   Ht 5\' 9"  (1.753 m)   Wt 172 lb (78 kg)   BMI 25.40 kg/m   hgba1c 7.5%       Assessment & Plan:  Patrick Valentine in today with chief complaint of Recheck HgB A1c   1. Type 2 diabetes mellitus not at goal [facility]) Continue to watch carbs in diet Continue exercise continue all meds Recheck in 3 months - Bayer [facility] Hb A1c [facility], FNP

## 2018-09-21 ENCOUNTER — Ambulatory Visit (INDEPENDENT_AMBULATORY_CARE_PROVIDER_SITE_OTHER): Payer: Medicare Other | Admitting: *Deleted

## 2018-09-21 DIAGNOSIS — Z23 Encounter for immunization: Secondary | ICD-10-CM

## 2018-09-22 ENCOUNTER — Ambulatory Visit: Payer: Medicare Other | Admitting: Nurse Practitioner

## 2018-10-27 ENCOUNTER — Encounter: Payer: Self-pay | Admitting: Nurse Practitioner

## 2018-10-27 ENCOUNTER — Ambulatory Visit: Payer: Medicare Other | Admitting: Nurse Practitioner

## 2018-10-27 VITALS — BP 134/88 | HR 83 | Temp 96.5°F | Ht 68.0 in | Wt 174.0 lb

## 2018-10-27 DIAGNOSIS — Z6826 Body mass index (BMI) 26.0-26.9, adult: Secondary | ICD-10-CM | POA: Diagnosis not present

## 2018-10-27 DIAGNOSIS — E1169 Type 2 diabetes mellitus with other specified complication: Secondary | ICD-10-CM | POA: Diagnosis not present

## 2018-10-27 DIAGNOSIS — E119 Type 2 diabetes mellitus without complications: Secondary | ICD-10-CM | POA: Diagnosis not present

## 2018-10-27 DIAGNOSIS — E785 Hyperlipidemia, unspecified: Secondary | ICD-10-CM

## 2018-10-27 LAB — LIPID PANEL
CHOL/HDL RATIO: 4.5 ratio (ref 0.0–5.0)
Cholesterol, Total: 187 mg/dL (ref 100–199)
HDL: 42 mg/dL (ref >39)
LDL CALC: 109 mg/dL — AB (ref 0–99)
Triglycerides: 182 mg/dL — ABNORMAL HIGH (ref 0–149)
VLDL Cholesterol Cal: 36 mg/dL (ref 5–40)

## 2018-10-27 LAB — CMP14+EGFR
A/G RATIO: 1.9 (ref 1.2–2.2)
ALBUMIN: 4.8 g/dL (ref 3.5–5.5)
ALT: 20 IU/L (ref 0–44)
AST: 16 IU/L (ref 0–40)
Alkaline Phosphatase: 64 IU/L (ref 39–117)
BUN / CREAT RATIO: 12 (ref 9–20)
BUN: 11 mg/dL (ref 6–24)
Bilirubin Total: 0.6 mg/dL (ref 0.0–1.2)
CALCIUM: 10.1 mg/dL (ref 8.7–10.2)
CO2: 24 mmol/L (ref 20–29)
CREATININE: 0.92 mg/dL (ref 0.76–1.27)
Chloride: 94 mmol/L — ABNORMAL LOW (ref 96–106)
GFR, EST AFRICAN AMERICAN: 106 mL/min/{1.73_m2} (ref >59)
GFR, EST NON AFRICAN AMERICAN: 91 mL/min/{1.73_m2} (ref >59)
Globulin, Total: 2.5 g/dL (ref 1.5–4.5)
Glucose: 189 mg/dL — ABNORMAL HIGH (ref 65–99)
POTASSIUM: 5.1 mmol/L (ref 3.5–5.2)
Sodium: 134 mmol/L (ref 134–144)
Total Protein: 7.3 g/dL (ref 6.0–8.5)

## 2018-10-27 LAB — BAYER DCA HB A1C WAIVED: HB A1C (BAYER DCA - WAIVED): 7.6 % — ABNORMAL HIGH (ref <7.0)

## 2018-10-27 MED ORDER — ROSUVASTATIN CALCIUM 20 MG PO TABS: 20.0000 mg | ORAL_TABLET | Freq: Every day | ORAL | 1 refills | Status: DC

## 2018-10-27 MED ORDER — DAPAGLIFLOZIN PROPANEDIOL 5 MG PO TABS: 5.0000 mg | ORAL_TABLET | Freq: Every day | ORAL | 0 refills | Status: DC

## 2018-10-27 MED ORDER — METFORMIN HCL 1000 MG PO TABS: 1000.0000 mg | ORAL_TABLET | Freq: Two times a day (BID) | ORAL | 1 refills | Status: DC

## 2018-10-27 NOTE — Progress Notes (Signed)
Subjective:    Patient ID: Patrick Valentine, male    DOB: [birth year], 57 y.o.   MRN: [id number]   Chief Complaint: Medical Management of Chronic Issues   HPI:  1. Type 2 diabetes mellitus not at goal Patrick Valentine)  -does check blood sugars at home, maybe once a week; averages 140s -does not watch carbs/sweets in diet -on Farxiga & Metformin  2. Hyperlipidemia associated with type 2 diabetes mellitus (Patrick Valentine)  -does not watch fat intake in diet -tolerating Crestor, no myalgias  3. BMI 26.0-26.9,adult  -walks a mile every day, lifts weights and goes to the gym for the past 2 months    Outpatient Encounter Medications as of 10/27/2018  Medication Sig  . ACCU-CHEK FASTCLIX LANCETS MISC Use to check blood glucose twice daily  . Blood Glucose Monitoring Suppl (ACCU-CHEK NANO SMARTVIEW) w/Device KIT Use to test BG bid. DX: E11.9  . dapagliflozin propanediol (FARXIGA) 5 MG TABS tablet Take 5 mg by mouth daily.  Marland Kitchen glucose blood (ACCU-CHEK SMARTVIEW) test strip Test Bid. DX:E11.9  . Lancet Devices (ONE TOUCH DELICA LANCING DEV) MISC Use to check blood sugar daily  . metFORMIN (GLUCOPHAGE) 1000 MG tablet Take 1 tablet (1,000 mg total) by mouth 2 (two) times daily with a meal.  . rosuvastatin (CRESTOR) 20 MG tablet Take 1 tablet (20 mg total) by mouth daily.   No facility-administered encounter medications on file as of 10/27/2018.       New complaints: None today  Social history: Does not work    Review of Systems  Constitutional: Negative for activity change, appetite change, chills, fatigue, fever and unexpected weight change.  HENT: Negative for congestion, ear pain, rhinorrhea, sinus pressure, sinus pain and sore throat.   Eyes: Negative for pain, redness and visual disturbance.  Respiratory: Negative for cough, chest tightness, shortness of breath and wheezing.   Cardiovascular: Negative for chest pain, palpitations and leg swelling.  Gastrointestinal: Negative for abdominal pain,  constipation, diarrhea, nausea and vomiting.  Endocrine: Negative for cold intolerance, heat intolerance, polydipsia, polyphagia and polyuria.  Genitourinary: Negative for difficulty urinating, dysuria and urgency.  Musculoskeletal: Negative for arthralgias, gait problem, joint swelling and myalgias.  Skin: Negative for rash and wound.  Allergic/Immunologic: Negative for environmental allergies and food allergies.  Neurological: Negative for dizziness, tremors, weakness and numbness.  Hematological: Does not bruise/bleed easily.  Psychiatric/Behavioral: Negative for behavioral problems, confusion, decreased concentration, sleep disturbance and suicidal ideas. The patient is not nervous/anxious.        Objective:   Physical Exam  Constitutional: He is oriented to person, place, and time. He appears well-developed and well-nourished.  HENT:  Head: Normocephalic and atraumatic.  Right Ear: Tympanic membrane and external ear normal. Decreased hearing (hearing aid) is noted.  Left Ear: Tympanic membrane and external ear normal. Decreased hearing (hearing aid) is noted.  Nose: Nose normal.  Mouth/Throat: Oropharynx is clear and moist. No oropharyngeal exudate.  Erythema in bilateral ear canal  Eyes: Pupils are equal, round, and reactive to light. Conjunctivae and EOM are normal.  Neck: Normal range of motion. Neck supple. No thyromegaly present.  Cardiovascular: Normal rate, regular rhythm, normal heart sounds and intact distal pulses.  Pulses:      Dorsalis pedis pulses are 3+ on the right side, and 3+ on the left side.       Posterior tibial pulses are 3+ on the right side, and 3+ on the left side.  Pulmonary/Chest: Effort normal and breath sounds normal.  Abdominal: Soft.  Bowel sounds are normal.  Musculoskeletal: Normal range of motion.  Feet:  Right Foot:  Skin Integrity: Positive for dry skin.  Left Foot:  Skin Integrity: Positive for dry skin.  Neurological: He is alert and  oriented to person, place, and time. He displays normal reflexes. No cranial nerve deficit.  Skin: Skin is warm and dry.  Psychiatric: He has a normal mood and affect. His behavior is normal. Judgment and thought content normal.  Nursing note and vitals reviewed.   BP 134/88   Pulse 83   Temp (!) 96.5 F (35.8 C) (Oral)   Ht 5' 8" (1.727 m)   Wt 174 lb (78.9 kg)   BMI 26.46 kg/m    A1C today is 7.6, 3 months ago A1C was 7.5    Assessment & Plan:  Patrick Valentine comes in today with chief complaint of Medical Management of Chronic Issues   Diagnosis and orders addressed:  1. Type 2 diabetes mellitus not at goal [facility]) -check blood sugars daily and bring log to next appointment - Bayer DCA Hb A1c Waived - [place] / creatinine urine ratio - dapagliflozin propanediol (FARXIGA) 5 MG TABS tablet; Take 5 mg by mouth daily.  Dispense: 30 tablet; Refill: 0 - metFORMIN (GLUCOPHAGE) 1000 MG tablet; Take 1 tablet (1,000 mg total) by mouth 2 (two) times daily with a meal.  Dispense: 180 tablet; Refill: 1 -keep food log; strict carb control  2. Hyperlipidemia associated with type 2 diabetes mellitus ([place]) -continue low fat diet and exercise regimin - Lipid panel - rosuvastatin (CRESTOR) 20 MG tablet; Take 1 tablet (20 mg total) by mouth daily.  Dispense: 90 tablet; Refill: 1  3. BMI 26.0-26.9,adult -continue dietary changes and exercise regimin   Labs pending Health Maintenance reviewed Diet and exercise encouraged  Follow up plan: 3 months   Patrick Hassell Done, FNP

## 2018-12-15 ENCOUNTER — Other Ambulatory Visit: Payer: Self-pay | Admitting: *Deleted

## 2018-12-15 MED ORDER — ACCU-CHEK FASTCLIX LANCET KIT: PACK | 0 refills | Status: DC

## 2018-12-15 MED ORDER — ACCU-CHEK SMARTVIEW CONTROL VI LIQD: 0 refills | Status: DC

## 2018-12-15 MED ORDER — BD SWAB SINGLE USE REGULAR PADS: MEDICATED_PAD | 3 refills | Status: DC

## 2018-12-15 MED ORDER — ACCU-CHEK NANO SMARTVIEW W/DEVICE KIT: PACK | 0 refills | Status: DC

## 2018-12-15 NOTE — Addendum Note (Signed)
Addended by: Antonietta Barcelona D on: 12/15/2018 11:28 AM   Modules accepted: Orders

## 2018-12-16 ENCOUNTER — Other Ambulatory Visit: Payer: Self-pay | Admitting: *Deleted

## 2018-12-16 MED ORDER — ACCU-CHEK AVIVA PLUS W/DEVICE KIT: PACK | 0 refills | Status: DC

## 2018-12-16 MED ORDER — ACCU-CHEK FASTCLIX LANCETS MISC: 3 refills | Status: DC

## 2018-12-16 MED ORDER — GLUCOSE BLOOD VI STRP: ORAL_STRIP | 12 refills | Status: DC

## 2018-12-16 NOTE — Telephone Encounter (Signed)
Need new Rx for Accu-Chek Aviva Plus w/ strips & lancets

## 2018-12-23 ENCOUNTER — Telehealth: Payer: Self-pay | Admitting: *Deleted

## 2018-12-23 MED ORDER — ACCU-CHEK SOFTCLIX LANCETS MISC: 3 refills | Status: DC

## 2018-12-23 MED ORDER — ACCU-CHEK AVIVA VI SOLN: 0 refills | Status: DC

## 2018-12-23 NOTE — Telephone Encounter (Signed)
Refill sent to pharmacy.   

## 2018-12-29 ENCOUNTER — Encounter: Payer: Self-pay | Admitting: *Deleted

## 2019-01-17 DIAGNOSIS — R0789 Other chest pain: Secondary | ICD-10-CM | POA: Diagnosis not present

## 2019-01-17 DIAGNOSIS — R1030 Lower abdominal pain, unspecified: Secondary | ICD-10-CM | POA: Diagnosis not present

## 2019-01-17 DIAGNOSIS — K5730 Diverticulosis of large intestine without perforation or abscess without bleeding: Secondary | ICD-10-CM | POA: Diagnosis not present

## 2019-01-17 DIAGNOSIS — S27892A Contusion of other specified intrathoracic organs, initial encounter: Secondary | ICD-10-CM | POA: Diagnosis not present

## 2019-01-17 DIAGNOSIS — E785 Hyperlipidemia, unspecified: Secondary | ICD-10-CM | POA: Diagnosis not present

## 2019-01-17 DIAGNOSIS — R079 Chest pain, unspecified: Secondary | ICD-10-CM | POA: Diagnosis not present

## 2019-01-17 DIAGNOSIS — E119 Type 2 diabetes mellitus without complications: Secondary | ICD-10-CM | POA: Diagnosis not present

## 2019-01-17 DIAGNOSIS — R000 Tachycardia, unspecified: Secondary | ICD-10-CM | POA: Diagnosis not present

## 2019-01-17 DIAGNOSIS — S0990XA Unspecified injury of head, initial encounter: Secondary | ICD-10-CM | POA: Diagnosis not present

## 2019-01-17 DIAGNOSIS — S3992XA Unspecified injury of lower back, initial encounter: Secondary | ICD-10-CM | POA: Diagnosis not present

## 2019-01-17 DIAGNOSIS — M545 Low back pain: Secondary | ICD-10-CM | POA: Diagnosis not present

## 2019-01-17 DIAGNOSIS — S27899A Unspecified injury of other specified intrathoracic organs, initial encounter: Secondary | ICD-10-CM | POA: Diagnosis not present

## 2019-01-17 DIAGNOSIS — S2220XA Unspecified fracture of sternum, initial encounter for closed fracture: Secondary | ICD-10-CM | POA: Diagnosis not present

## 2019-01-17 DIAGNOSIS — S199XXA Unspecified injury of neck, initial encounter: Secondary | ICD-10-CM | POA: Diagnosis not present

## 2019-01-17 DIAGNOSIS — R0689 Other abnormalities of breathing: Secondary | ICD-10-CM | POA: Diagnosis not present

## 2019-01-18 DIAGNOSIS — S22080A Wedge compression fracture of T11-T12 vertebra, initial encounter for closed fracture: Secondary | ICD-10-CM | POA: Diagnosis not present

## 2019-01-18 DIAGNOSIS — M549 Dorsalgia, unspecified: Secondary | ICD-10-CM | POA: Diagnosis not present

## 2019-01-18 DIAGNOSIS — M4854XA Collapsed vertebra, not elsewhere classified, thoracic region, initial encounter for fracture: Secondary | ICD-10-CM | POA: Diagnosis not present

## 2019-01-18 DIAGNOSIS — M47819 Spondylosis without myelopathy or radiculopathy, site unspecified: Secondary | ICD-10-CM | POA: Diagnosis not present

## 2019-01-18 DIAGNOSIS — M40294 Other kyphosis, thoracic region: Secondary | ICD-10-CM | POA: Diagnosis not present

## 2019-01-18 DIAGNOSIS — S2222XA Fracture of body of sternum, initial encounter for closed fracture: Secondary | ICD-10-CM | POA: Diagnosis not present

## 2019-01-18 DIAGNOSIS — M8580 Other specified disorders of bone density and structure, unspecified site: Secondary | ICD-10-CM | POA: Diagnosis not present

## 2019-01-18 DIAGNOSIS — S2220XA Unspecified fracture of sternum, initial encounter for closed fracture: Secondary | ICD-10-CM | POA: Insufficient documentation

## 2019-01-18 DIAGNOSIS — E119 Type 2 diabetes mellitus without complications: Secondary | ICD-10-CM | POA: Diagnosis not present

## 2019-01-18 DIAGNOSIS — R2689 Other abnormalities of gait and mobility: Secondary | ICD-10-CM | POA: Diagnosis not present

## 2019-01-18 DIAGNOSIS — Z7409 Other reduced mobility: Secondary | ICD-10-CM | POA: Diagnosis not present

## 2019-01-18 DIAGNOSIS — S27899A Unspecified injury of other specified intrathoracic organs, initial encounter: Secondary | ICD-10-CM | POA: Insufficient documentation

## 2019-01-18 DIAGNOSIS — R079 Chest pain, unspecified: Secondary | ICD-10-CM | POA: Diagnosis not present

## 2019-01-18 DIAGNOSIS — M519 Unspecified thoracic, thoracolumbar and lumbosacral intervertebral disc disorder: Secondary | ICD-10-CM | POA: Diagnosis not present

## 2019-01-18 DIAGNOSIS — Y999 Unspecified external cause status: Secondary | ICD-10-CM | POA: Diagnosis not present

## 2019-01-18 DIAGNOSIS — I878 Other specified disorders of veins: Secondary | ICD-10-CM | POA: Diagnosis not present

## 2019-01-18 DIAGNOSIS — Y92410 Unspecified street and highway as the place of occurrence of the external cause: Secondary | ICD-10-CM | POA: Diagnosis not present

## 2019-01-19 DIAGNOSIS — S22080A Wedge compression fracture of T11-T12 vertebra, initial encounter for closed fracture: Secondary | ICD-10-CM | POA: Diagnosis not present

## 2019-01-19 DIAGNOSIS — S27892A Contusion of other specified intrathoracic organs, initial encounter: Secondary | ICD-10-CM | POA: Diagnosis not present

## 2019-01-19 DIAGNOSIS — S2220XA Unspecified fracture of sternum, initial encounter for closed fracture: Secondary | ICD-10-CM | POA: Diagnosis not present

## 2019-01-20 DIAGNOSIS — S22080A Wedge compression fracture of T11-T12 vertebra, initial encounter for closed fracture: Secondary | ICD-10-CM | POA: Diagnosis not present

## 2019-01-20 DIAGNOSIS — S2220XA Unspecified fracture of sternum, initial encounter for closed fracture: Secondary | ICD-10-CM | POA: Diagnosis not present

## 2019-01-20 DIAGNOSIS — S27892A Contusion of other specified intrathoracic organs, initial encounter: Secondary | ICD-10-CM | POA: Diagnosis not present

## 2019-01-24 ENCOUNTER — Ambulatory Visit: Payer: Medicare Other | Admitting: Nurse Practitioner

## 2019-01-27 ENCOUNTER — Ambulatory Visit: Payer: Medicare Other | Admitting: Nurse Practitioner

## 2019-01-30 DIAGNOSIS — I878 Other specified disorders of veins: Secondary | ICD-10-CM | POA: Diagnosis not present

## 2019-01-30 DIAGNOSIS — S322XXD Fracture of coccyx, subsequent encounter for fracture with routine healing: Secondary | ICD-10-CM | POA: Diagnosis not present

## 2019-01-30 DIAGNOSIS — M47818 Spondylosis without myelopathy or radiculopathy, sacral and sacrococcygeal region: Secondary | ICD-10-CM | POA: Diagnosis not present

## 2019-01-30 DIAGNOSIS — M519 Unspecified thoracic, thoracolumbar and lumbosacral intervertebral disc disorder: Secondary | ICD-10-CM | POA: Diagnosis not present

## 2019-01-30 DIAGNOSIS — S2220XD Unspecified fracture of sternum, subsequent encounter for fracture with routine healing: Secondary | ICD-10-CM | POA: Diagnosis not present

## 2019-01-30 DIAGNOSIS — M160 Bilateral primary osteoarthritis of hip: Secondary | ICD-10-CM | POA: Diagnosis not present

## 2019-01-30 DIAGNOSIS — M40294 Other kyphosis, thoracic region: Secondary | ICD-10-CM | POA: Diagnosis not present

## 2019-01-30 DIAGNOSIS — Z9852 Vasectomy status: Secondary | ICD-10-CM | POA: Diagnosis not present

## 2019-01-30 DIAGNOSIS — M8580 Other specified disorders of bone density and structure, unspecified site: Secondary | ICD-10-CM | POA: Diagnosis not present

## 2019-01-30 DIAGNOSIS — S22080D Wedge compression fracture of T11-T12 vertebra, subsequent encounter for fracture with routine healing: Secondary | ICD-10-CM | POA: Diagnosis not present

## 2019-01-30 DIAGNOSIS — M533 Sacrococcygeal disorders, not elsewhere classified: Secondary | ICD-10-CM | POA: Diagnosis not present

## 2019-01-30 DIAGNOSIS — M8588 Other specified disorders of bone density and structure, other site: Secondary | ICD-10-CM | POA: Diagnosis not present

## 2019-01-30 DIAGNOSIS — M5134 Other intervertebral disc degeneration, thoracic region: Secondary | ICD-10-CM | POA: Diagnosis not present

## 2019-02-09 DIAGNOSIS — S27892D Contusion of other specified intrathoracic organs, subsequent encounter: Secondary | ICD-10-CM | POA: Diagnosis not present

## 2019-02-09 DIAGNOSIS — Z79899 Other long term (current) drug therapy: Secondary | ICD-10-CM | POA: Diagnosis not present

## 2019-02-09 DIAGNOSIS — S2220XD Unspecified fracture of sternum, subsequent encounter for fracture with routine healing: Secondary | ICD-10-CM | POA: Diagnosis not present

## 2019-02-17 ENCOUNTER — Encounter: Payer: Self-pay | Admitting: Nurse Practitioner

## 2019-02-17 ENCOUNTER — Ambulatory Visit (INDEPENDENT_AMBULATORY_CARE_PROVIDER_SITE_OTHER): Payer: Medicare HMO | Admitting: Nurse Practitioner

## 2019-02-17 VITALS — BP 121/85 | HR 89 | Temp 96.8°F | Ht 68.0 in | Wt 170.0 lb

## 2019-02-17 DIAGNOSIS — E1169 Type 2 diabetes mellitus with other specified complication: Secondary | ICD-10-CM

## 2019-02-17 DIAGNOSIS — E119 Type 2 diabetes mellitus without complications: Secondary | ICD-10-CM

## 2019-02-17 DIAGNOSIS — E785 Hyperlipidemia, unspecified: Secondary | ICD-10-CM

## 2019-02-17 DIAGNOSIS — Z6826 Body mass index (BMI) 26.0-26.9, adult: Secondary | ICD-10-CM | POA: Diagnosis not present

## 2019-02-17 LAB — BAYER DCA HB A1C WAIVED: HB A1C (BAYER DCA - WAIVED): 7.4 % — ABNORMAL HIGH (ref <7.0)

## 2019-02-17 MED ORDER — METFORMIN HCL 1000 MG PO TABS: 1000.0000 mg | ORAL_TABLET | Freq: Two times a day (BID) | ORAL | 1 refills | Status: DC

## 2019-02-17 MED ORDER — ROSUVASTATIN CALCIUM 20 MG PO TABS: 20.0000 mg | ORAL_TABLET | Freq: Every day | ORAL | 1 refills | Status: DC

## 2019-02-17 MED ORDER — DAPAGLIFLOZIN PROPANEDIOL 5 MG PO TABS: 5.0000 mg | ORAL_TABLET | Freq: Every day | ORAL | 1 refills | Status: DC

## 2019-02-17 NOTE — Patient Instructions (Signed)
Fall Prevention in the Home, Adult  Falls can cause injuries. They can happen to people of all ages. There are many things you can do to make your home safe and to help prevent falls. Ask for help when making these changes, if needed.  What actions can I take to prevent falls?  General Instructions  · Use good lighting in all rooms. Replace any light bulbs that burn out.  · Turn on the lights when you go into a dark area. Use night-lights.  · Keep items that you use often in easy-to-reach places. Lower the shelves around your home if necessary.  · Set up your furniture so you have a clear path. Avoid moving your furniture around.  · Do not have throw rugs and other things on the floor that can make you trip.  · Avoid walking on wet floors.  · If any of your floors are uneven, fix them.  · Add color or contrast paint or tape to clearly mark and help you see:  ? Any grab bars or handrails.  ? First and last steps of stairways.  ? Where the edge of each step is.  · If you use a stepladder:  ? Make sure that it is fully opened. Do not climb a closed stepladder.  ? Make sure that both sides of the stepladder are locked into place.  ? Ask someone to hold the stepladder for you while you use it.  · If there are any pets around you, be aware of where they are.  What can I do in the bathroom?         · Keep the floor dry. Clean up any water that spills onto the floor as soon as it happens.  · Remove soap buildup in the tub or shower regularly.  · Use non-skid mats or decals on the floor of the tub or shower.  · Attach bath mats securely with double-sided, non-slip rug tape.  · If you need to sit down in the shower, use a plastic, non-slip stool.  · Install grab bars by the toilet and in the tub and shower. Do not use towel bars as grab bars.  What can I do in the bedroom?  · Make sure that you have a light by your bed that is easy to reach.  · Do not use any sheets or blankets that are too big for your bed. They should  not hang down onto the floor.  · Have a firm chair that has side arms. You can use this for support while you get dressed.  What can I do in the kitchen?  · Clean up any spills right away.  · If you need to reach something above you, use a strong step stool that has a grab bar.  · Keep electrical cords out of the way.  · Do not use floor polish or wax that makes floors slippery. If you must use wax, use non-skid floor wax.  What can I do with my stairs?  · Do not leave any items on the stairs.  · Make sure that you have a light switch at the top of the stairs and the bottom of the stairs. If you do not have them, ask someone to add them for you.  · Make sure that there are handrails on both sides of the stairs, and use them. Fix handrails that are broken or loose. Make sure that handrails are as long as the stairways.  ·   Install non-slip stair treads on all stairs in your home.  · Avoid having throw rugs at the top or bottom of the stairs. If you do have throw rugs, attach them to the floor with carpet tape.  · Choose a carpet that does not hide the edge of the steps on the stairway.  · Check any carpeting to make sure that it is firmly attached to the stairs. Fix any carpet that is loose or worn.  What can I do on the outside of my home?  · Use bright outdoor lighting.  · Regularly fix the edges of walkways and driveways and fix any cracks.  · Remove anything that might make you trip as you walk through a door, such as a raised step or threshold.  · Trim any bushes or trees on the path to your home.  · Regularly check to see if handrails are loose or broken. Make sure that both sides of any steps have handrails.  · Install guardrails along the edges of any raised decks and porches.  · Clear walking paths of anything that might make someone trip, such as tools or rocks.  · Have any leaves, snow, or ice cleared regularly.  · Use sand or salt on walking paths during winter.  · Clean up any spills in your garage right  away. This includes grease or oil spills.  What other actions can I take?  · Wear shoes that:  ? Have a low heel. Do not wear high heels.  ? Have rubber bottoms.  ? Are comfortable and fit you well.  ? Are closed at the toe. Do not wear open-toe sandals.  · Use tools that help you move around (mobility aids) if they are needed. These include:  ? Canes.  ? Walkers.  ? Scooters.  ? Crutches.  · Review your medicines with your doctor. Some medicines can make you feel dizzy. This can increase your chance of falling.  Ask your doctor what other things you can do to help prevent falls.  Where to find more information  · Centers for Disease Control and Prevention, [place]AD[place]: https://cdc.gov  · National Institute on Aging: https://go4life.nia.nih.gov  Contact a doctor if:  · You are afraid of falling at home.  · You feel weak, drowsy, or dizzy at home.  · You fall at home.  Summary  · There are many simple things that you can do to make your home safe and to help prevent falls.  · Ways to make your home safe include removing tripping hazards and installing grab bars in the bathroom.  · Ask for help when making these changes in your home.  This information is not intended to replace advice given to you by your health care provider. Make sure you discuss any questions you have with your health care provider.  Document Released: 09/26/2009 Document Revised: 07/15/2017 Document Reviewed: 07/15/2017  Elsevier Interactive Patient Education © 2019 Elsevier Inc.
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
## 2019-02-18 LAB — CMP14+EGFR
ALT: 15 IU/L (ref 0–44)
AST: 13 IU/L (ref 0–40)
Albumin/Globulin Ratio: 2.1 (ref 1.2–2.2)
Albumin: 4.7 g/dL (ref 3.8–4.9)
Alkaline Phosphatase: 81 IU/L (ref 39–117)
BUN/Creatinine Ratio: 15 (ref 9–20)
BUN: 12 mg/dL (ref 6–24)
Bilirubin Total: 0.5 mg/dL (ref 0.0–1.2)
CO2: 25 mmol/L (ref 20–29)
Calcium: 10 mg/dL (ref 8.7–10.2)
Chloride: 99 mmol/L (ref 96–106)
Creatinine, Ser: 0.82 mg/dL (ref 0.76–1.27)
GFR calc Af Amer: 113 mL/min/{1.73_m2} (ref >59)
GFR calc non Af Amer: 97 mL/min/{1.73_m2} (ref >59)
GLUCOSE: 181 mg/dL — AB (ref 65–99)
Globulin, Total: 2.2 g/dL (ref 1.5–4.5)
Potassium: 4.5 mmol/L (ref 3.5–5.2)
Sodium: 138 mmol/L (ref 134–144)
Total Protein: 6.9 g/dL (ref 6.0–8.5)

## 2019-02-18 LAB — LIPID PANEL
CHOLESTEROL TOTAL: 151 mg/dL (ref 100–199)
Chol/HDL Ratio: 4.1 ratio (ref 0.0–5.0)
HDL: 37 mg/dL — ABNORMAL LOW (ref >39)
LDL Calculated: 79 mg/dL (ref 0–99)
Triglycerides: 177 mg/dL — ABNORMAL HIGH (ref 0–149)
VLDL Cholesterol Cal: 35 mg/dL (ref 5–40)

## 2019-04-24 DIAGNOSIS — S22088A Other fracture of T11-T12 vertebra, initial encounter for closed fracture: Secondary | ICD-10-CM | POA: Diagnosis not present

## 2019-04-24 DIAGNOSIS — S22080D Wedge compression fracture of T11-T12 vertebra, subsequent encounter for fracture with routine healing: Secondary | ICD-10-CM | POA: Diagnosis not present

## 2019-04-28 ENCOUNTER — Ambulatory Visit: Payer: Medicare Other | Admitting: Nurse Practitioner

## 2019-06-12 ENCOUNTER — Encounter: Payer: Self-pay | Admitting: Nurse Practitioner

## 2019-06-12 ENCOUNTER — Other Ambulatory Visit: Payer: Self-pay

## 2019-06-12 ENCOUNTER — Ambulatory Visit (INDEPENDENT_AMBULATORY_CARE_PROVIDER_SITE_OTHER): Payer: Medicare HMO | Admitting: Nurse Practitioner

## 2019-06-12 VITALS — BP 139/83 | HR 82 | Temp 97.0°F | Ht 68.0 in | Wt 172.0 lb

## 2019-06-12 DIAGNOSIS — E119 Type 2 diabetes mellitus without complications: Secondary | ICD-10-CM

## 2019-06-12 DIAGNOSIS — E785 Hyperlipidemia, unspecified: Secondary | ICD-10-CM | POA: Diagnosis not present

## 2019-06-12 DIAGNOSIS — E1169 Type 2 diabetes mellitus with other specified complication: Secondary | ICD-10-CM

## 2019-06-12 DIAGNOSIS — Z6826 Body mass index (BMI) 26.0-26.9, adult: Secondary | ICD-10-CM

## 2019-06-12 LAB — CMP14+EGFR
ALT: 17 IU/L (ref 0–44)
AST: 12 IU/L (ref 0–40)
Albumin/Globulin Ratio: 2.1 (ref 1.2–2.2)
Albumin: 4.7 g/dL (ref 3.8–4.9)
Alkaline Phosphatase: 62 IU/L (ref 39–117)
BUN/Creatinine Ratio: 10 (ref 9–20)
BUN: 9 mg/dL (ref 6–24)
Bilirubin Total: 0.5 mg/dL (ref 0.0–1.2)
CO2: 21 mmol/L (ref 20–29)
Calcium: 10.2 mg/dL (ref 8.7–10.2)
Chloride: 99 mmol/L (ref 96–106)
Creatinine, Ser: 0.92 mg/dL (ref 0.76–1.27)
GFR calc Af Amer: 106 mL/min/{1.73_m2} (ref >59)
GFR calc non Af Amer: 91 mL/min/{1.73_m2} (ref >59)
Globulin, Total: 2.2 g/dL (ref 1.5–4.5)
Glucose: 198 mg/dL — ABNORMAL HIGH (ref 65–99)
Potassium: 5.5 mmol/L — ABNORMAL HIGH (ref 3.5–5.2)
Sodium: 139 mmol/L (ref 134–144)
Total Protein: 6.9 g/dL (ref 6.0–8.5)

## 2019-06-12 LAB — LIPID PANEL
Chol/HDL Ratio: 3.9 ratio (ref 0.0–5.0)
Cholesterol, Total: 163 mg/dL (ref 100–199)
HDL: 42 mg/dL (ref >39)
LDL Calculated: 89 mg/dL (ref 0–99)
Triglycerides: 161 mg/dL — ABNORMAL HIGH (ref 0–149)
VLDL Cholesterol Cal: 32 mg/dL (ref 5–40)

## 2019-06-12 LAB — BAYER DCA HB A1C WAIVED: HB A1C (BAYER DCA - WAIVED): 8.5 % — ABNORMAL HIGH (ref <7.0)

## 2019-06-12 MED ORDER — FARXIGA 5 MG PO TABS: 5.0000 mg | ORAL_TABLET | Freq: Every day | ORAL | 1 refills | Status: DC

## 2019-06-12 MED ORDER — METFORMIN HCL 1000 MG PO TABS: 1000.0000 mg | ORAL_TABLET | Freq: Two times a day (BID) | ORAL | 1 refills | Status: DC

## 2019-06-12 MED ORDER — ROSUVASTATIN CALCIUM 20 MG PO TABS: 20.0000 mg | ORAL_TABLET | Freq: Every day | ORAL | 1 refills | Status: DC

## 2019-06-12 NOTE — Patient Instructions (Signed)
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
## 2019-06-13 ENCOUNTER — Ambulatory Visit: Payer: Medicare HMO | Admitting: Nurse Practitioner

## 2019-06-27 ENCOUNTER — Telehealth: Payer: Self-pay | Admitting: Nurse Practitioner

## 2019-06-27 NOTE — Chronic Care Management (AMB) (Signed)
Chronic Care Management   Note  06/27/2019 Name: Kiegan Macaraeg MRN: [id number] DOB: February 12, 1961  Benjimen Kelley is a 58 y.o. year old male who is a primary care patient of Chevis Pretty, FNP. I reached out to Victorino Sparrow by phone today in response to a referral sent by Mr. Jacob Moomaw's health plan.    Mr. Caratachea was given information about Chronic Care Management services today including:  1. CCM service includes personalized support from designated clinical staff supervised by his physician, including individualized plan of care and coordination with other care providers 2. 24/7 contact phone numbers for assistance for urgent and routine care needs. 3. Service will only be billed when office clinical staff spend 20 minutes or more in a month to coordinate care. 4. Only one practitioner may furnish and bill the service in a calendar month. 5. The patient may stop CCM services at any time (effective at the end of the month) by phone call to the office staff. 6. The patient will be responsible for cost sharing (co-pay) of up to 20% of the service fee (after annual deductible is met).  Patient did not agree to enrollment in care management services and does not wish to consider at this time.  Follow up plan: The patient has been provided with contact information for the chronic care management team and has been advised to call with any health related questions or concerns.    [place]  [email or web address]   ??[phone]

## 2019-08-28 ENCOUNTER — Other Ambulatory Visit: Payer: Self-pay

## 2019-08-29 ENCOUNTER — Ambulatory Visit (INDEPENDENT_AMBULATORY_CARE_PROVIDER_SITE_OTHER): Payer: Medicare HMO | Admitting: *Deleted

## 2019-08-29 DIAGNOSIS — Z23 Encounter for immunization: Secondary | ICD-10-CM | POA: Diagnosis not present

## 2019-09-12 ENCOUNTER — Ambulatory Visit: Payer: Medicare HMO | Admitting: Nurse Practitioner

## 2019-09-12 ENCOUNTER — Other Ambulatory Visit: Payer: Self-pay

## 2019-09-13 ENCOUNTER — Ambulatory Visit (INDEPENDENT_AMBULATORY_CARE_PROVIDER_SITE_OTHER): Payer: Medicare HMO | Admitting: Nurse Practitioner

## 2019-09-13 ENCOUNTER — Encounter: Payer: Self-pay | Admitting: Nurse Practitioner

## 2019-09-13 VITALS — BP 133/89 | HR 91 | Temp 98.0°F | Resp 16 | Ht 68.0 in | Wt 172.0 lb

## 2019-09-13 DIAGNOSIS — Z6826 Body mass index (BMI) 26.0-26.9, adult: Secondary | ICD-10-CM

## 2019-09-13 DIAGNOSIS — H3552 Pigmentary retinal dystrophy: Secondary | ICD-10-CM

## 2019-09-13 DIAGNOSIS — E785 Hyperlipidemia, unspecified: Secondary | ICD-10-CM

## 2019-09-13 DIAGNOSIS — E1169 Type 2 diabetes mellitus with other specified complication: Secondary | ICD-10-CM

## 2019-09-13 DIAGNOSIS — E119 Type 2 diabetes mellitus without complications: Secondary | ICD-10-CM

## 2019-09-13 LAB — BAYER DCA HB A1C WAIVED: HB A1C (BAYER DCA - WAIVED): 9.2 % — ABNORMAL HIGH (ref <7.0)

## 2019-09-13 MED ORDER — METFORMIN HCL 1000 MG PO TABS: 1000.0000 mg | ORAL_TABLET | Freq: Two times a day (BID) | ORAL | 1 refills | Status: DC

## 2019-09-13 MED ORDER — ROSUVASTATIN CALCIUM 20 MG PO TABS: 20.0000 mg | ORAL_TABLET | Freq: Every day | ORAL | 1 refills | Status: DC

## 2019-09-13 NOTE — Progress Notes (Signed)
Subjective:    Patient ID: Patrick Valentine, male    DOB: [birth year], 58 y.o.   MRN: [id number]   Chief Complaint: Medical Management of Chronic Issues    HPI:  1. Hyperlipidemia associated with type 2 diabetes mellitus ([place]) Does try to watch diet but has not been exercising lately Lab Results  Component Value Date   CHOL 163 06/12/2019   HDL 42 06/12/2019   LDLCALC 89 06/12/2019   TRIG 161 (H) 06/12/2019   CHOLHDL 3.9 06/12/2019     2. Type 2 diabetes mellitus not at goal [facility]) Has not been watching carbsin diet and has not been checking blood sugars at home. We made no changes at last visit because patient says he can get it under control on his own. He has gotten it down on his own before. Lab Results  Component Value Date   HGBA1C 8.5 (H) 06/12/2019      3. Retinitis pigmentosa of both eyes Sees eye doctor every 6 months and is doing well.  4. BMI 26.0-26.9,adult No recent weight changes  Wt Readings from Last 3 Encounters:  09/13/19 172 lb (78 kg)  06/12/19 172 lb (78 kg)  02/17/19 170 lb (77.1 kg)   BMI Readings from Last 3 Encounters:  09/13/19 26.15 kg/m  06/12/19 26.15 kg/m  02/17/19 25.85 kg/m         Outpatient Encounter Medications as of 09/13/2019  Medication Sig  . ACCU-CHEK SOFTCLIX LANCETS lancets Test BID  . Alcohol Swabs (B-D SINGLE USE SWABS REGULAR) PADS Test BID  . Blood Glucose Calibration (ACCU-CHEK SMARTVIEW CONTROL) LIQD Use with meters as directed  . Blood Glucose Monitoring Suppl (ACCU-CHEK AVIVA PLUS) w/Device KIT Test BID  . glucose blood (ACCU-CHEK AVIVA PLUS) test strip Test BID  . Lancet Devices (ONE TOUCH DELICA LANCING DEV) MISC Use to check blood sugar daily  . Lancets Misc. (ACCU-CHEK FASTCLIX LANCET) KIT Test BID  . metFORMIN (GLUCOPHAGE) 1000 MG tablet Take 1 tablet (1,000 mg total) by mouth 2 (two) times daily with a meal.  . rosuvastatin (CRESTOR) 20 MG tablet Take 1 tablet (20 mg total) by mouth daily.  .  [DISCONTINUED] dapagliflozin propanediol (FARXIGA) 5 MG TABS tablet Take 5 mg by mouth daily.  . Blood Glucose Calibration (ACCU-CHEK AVIVA) SOLN Use with glucose meter     Past Surgical History:  Procedure Laterality Date  . NO PAST SURGERIES      Family History  Problem Relation Age of Onset  . Diabetes Father 30       IDDM  . Diabetes Sister 30       IDDM  . Colon cancer Neg Hx     New complaints: None today  Social history: His pastor died with covid 2 days ago  Controlled substance contract: n/a    Review of Systems  Constitutional: Negative for activity change and appetite change.  HENT: Negative.   Eyes: Negative for pain.  Respiratory: Negative for shortness of breath.   Cardiovascular: Negative for chest pain, palpitations and leg swelling.  Gastrointestinal: Negative for abdominal pain.  Endocrine: Negative for polydipsia.  Genitourinary: Negative.   Skin: Negative for rash.  Neurological: Negative for dizziness, weakness and headaches.  Hematological: Does not bruise/bleed easily.  Psychiatric/Behavioral: Negative.   All other systems reviewed and are negative.      Objective:   Physical Exam Vitals signs and nursing note reviewed.  Constitutional:      Appearance: Normal appearance. He is well-developed.  HENT:  Head: Normocephalic.     Nose: Nose normal.  Eyes:     Pupils: Pupils are equal, round, and reactive to light.  Neck:     Musculoskeletal: Normal range of motion and neck supple.     Thyroid: No thyroid mass or thyromegaly.     Vascular: No carotid bruit or JVD.     Trachea: Phonation normal.  Cardiovascular:     Rate and Rhythm: Normal rate and regular rhythm.  Pulmonary:     Effort: Pulmonary effort is normal. No respiratory distress.     Breath sounds: Normal breath sounds.  Abdominal:     General: Bowel sounds are normal.     Palpations: Abdomen is soft.     Tenderness: There is no abdominal tenderness.  Musculoskeletal:  Normal range of motion.  Lymphadenopathy:     Cervical: No cervical adenopathy.  Skin:    General: Skin is warm and dry.  Neurological:     Mental Status: He is alert and oriented to person, place, and time.  Psychiatric:        Behavior: Behavior normal.        Thought Content: Thought content normal.        Judgment: Judgment normal.    BP 133/89   Pulse 91   Temp 98 F (36.7 C) (Temporal)   Resp 16   Ht 5' 8"  (1.727 m)   Wt 172 lb (78 kg)   SpO2 99%   BMI 26.15 kg/m   HGBA1c 9.2%      Assessment & Plan:  [name] comes in today with chief complaint of Medical Management of Chronic Issues   Diagnosis and orders addressed:  1. Hyperlipidemia associated with type 2 diabetes mellitus (HCC) Low fat diet - Lipid panel - rosuvastatin (CRESTOR) 20 MG tablet; Take 1 tablet (20 mg total) by mouth daily.  Dispense: 90 tablet; Refill: 1  2. Type 2 diabetes mellitus not at goal [facility]) Strict carb counting Patient does not want to change meds- wants to get down on his own - Bayer DCA Hb A1c Waived - CMP14+EGFR - Microalbumin / creatinine urine ratio - metFORMIN (GLUCOPHAGE) 1000 MG tablet; Take 1 tablet (1,000 mg total) by mouth 2 (two) times daily with a meal.  Dispense: 180 tablet; Refill: 1  3. Retinitis pigmentosa of both eyes Keep follow up with eye doctor  4. BMI 26.0-26.9,adult Discussed diet and exercise for person with BMI >25 Will recheck weight in 3-6 months    Labs pending Health Maintenance reviewed Diet and exercise encouraged  Follow up plan: 1 month- diabetes recheck   [place], FNP

## 2019-09-13 NOTE — Patient Instructions (Signed)
Diabetes Mellitus and Exercise Exercising regularly is important for your overall health, especially when you have diabetes (diabetes mellitus). Exercising is not only about losing weight. It has many other health benefits, such as increasing muscle strength and bone density and reducing body fat and stress. This leads to improved fitness, flexibility, and endurance, all of which result in better overall health. Exercise has additional benefits for people with diabetes, including:  Reducing appetite.  Helping to lower and control blood glucose.  Lowering blood pressure.  Helping to control amounts of fatty substances (lipids) in the blood, such as cholesterol and triglycerides.  Helping the body to respond better to insulin (improving insulin sensitivity).  Reducing how much insulin the body needs.  Decreasing the risk for heart disease by: ? Lowering cholesterol and triglyceride levels. ? Increasing the levels of good cholesterol. ? Lowering blood glucose levels. What is my activity plan? Your health care provider or certified diabetes educator can help you make a plan for the type and frequency of exercise (activity plan) that works for you. Make sure that you:  Do at least 150 minutes of moderate-intensity or vigorous-intensity exercise each week. This could be brisk walking, biking, or water aerobics. ? Do stretching and strength exercises, such as yoga or weightlifting, at least 2 times a week. ? Spread out your activity over at least 3 days of the week.  Get some form of physical activity every day. ? Do not go more than 2 days in a row without some kind of physical activity. ? Avoid being inactive for more than 30 minutes at a time. Take frequent breaks to walk or stretch.  Choose a type of exercise or activity that you enjoy, and set realistic goals.  Start slowly, and gradually increase the intensity of your exercise over time. What do I need to know about managing my  diabetes?   Check your blood glucose before and after exercising. ? If your blood glucose is 240 mg/dL (13.3 mmol/L) or higher before you exercise, check your urine for ketones. If you have ketones in your urine, do not exercise until your blood glucose returns to normal. ? If your blood glucose is 100 mg/dL (5.6 mmol/L) or lower, eat a snack containing 15-20 grams of carbohydrate. Check your blood glucose 15 minutes after the snack to make sure that your level is above 100 mg/dL (5.6 mmol/L) before you start your exercise.  Know the symptoms of low blood glucose (hypoglycemia) and how to treat it. Your risk for hypoglycemia increases during and after exercise. Common symptoms of hypoglycemia can include: ? Hunger. ? Anxiety. ? Sweating and feeling clammy. ? Confusion. ? Dizziness or feeling light-headed. ? Increased heart rate or palpitations. ? Blurry vision. ? Tingling or numbness around the mouth, lips, or tongue. ? Tremors or shakes. ? Irritability.  Keep a rapid-acting carbohydrate snack available before, during, and after exercise to help prevent or treat hypoglycemia.  Avoid injecting insulin into areas of the body that are going to be exercised. For example, avoid injecting insulin into: ? The arms, when playing tennis. ? The legs, when jogging.  Keep records of your exercise habits. Doing this can help you and your health care provider adjust your diabetes management plan as needed. Write down: ? Food that you eat before and after you exercise. ? Blood glucose levels before and after you exercise. ? The type and amount of exercise you have done. ? When your insulin is expected to peak, if you use   insulin. Avoid exercising at times when your insulin is peaking.  When you start a new exercise or activity, work with your health care provider to make sure the activity is safe for you, and to adjust your insulin, medicines, or food intake as needed.  Drink plenty of water while  you exercise to prevent dehydration or heat stroke. Drink enough fluid to keep your urine clear or pale yellow. Summary  Exercising regularly is important for your overall health, especially when you have diabetes (diabetes mellitus).  Exercising has many health benefits, such as increasing muscle strength and bone density and reducing body fat and stress.  Your health care provider or certified diabetes educator can help you make a plan for the type and frequency of exercise (activity plan) that works for you.  When you start a new exercise or activity, work with your health care provider to make sure the activity is safe for you, and to adjust your insulin, medicines, or food intake as needed. This information is not intended to replace advice given to you by your health care provider. Make sure you discuss any questions you have with your health care provider. Document Released: 02/20/2004 Document Revised: 06/24/2017 Document Reviewed: 05/11/2016 Elsevier Patient Education  2020 Elsevier Inc.  

## 2019-09-14 LAB — CMP14+EGFR
ALT: 19 IU/L (ref 0–44)
AST: 15 IU/L (ref 0–40)
Albumin/Globulin Ratio: 1.9 (ref 1.2–2.2)
Albumin: 4.6 g/dL (ref 3.8–4.9)
Alkaline Phosphatase: 70 IU/L (ref 39–117)
BUN/Creatinine Ratio: 14 (ref 9–20)
BUN: 12 mg/dL (ref 6–24)
Bilirubin Total: 0.6 mg/dL (ref 0.0–1.2)
CO2: 24 mmol/L (ref 20–29)
Calcium: 10.1 mg/dL (ref 8.7–10.2)
Chloride: 95 mmol/L — ABNORMAL LOW (ref 96–106)
Creatinine, Ser: 0.87 mg/dL (ref 0.76–1.27)
GFR calc Af Amer: 109 mL/min/{1.73_m2} (ref >59)
GFR calc non Af Amer: 94 mL/min/{1.73_m2} (ref >59)
Globulin, Total: 2.4 g/dL (ref 1.5–4.5)
Glucose: 276 mg/dL — ABNORMAL HIGH (ref 65–99)
Potassium: 5.2 mmol/L (ref 3.5–5.2)
Sodium: 133 mmol/L — ABNORMAL LOW (ref 134–144)
Total Protein: 7 g/dL (ref 6.0–8.5)

## 2019-09-14 LAB — LIPID PANEL
Chol/HDL Ratio: 4.5 ratio (ref 0.0–5.0)
Cholesterol, Total: 185 mg/dL (ref 100–199)
HDL: 41 mg/dL (ref >39)
LDL Chol Calc (NIH): 110 mg/dL — ABNORMAL HIGH (ref 0–99)
Triglycerides: 197 mg/dL — ABNORMAL HIGH (ref 0–149)
VLDL Cholesterol Cal: 34 mg/dL (ref 5–40)

## 2019-09-19 ENCOUNTER — Encounter: Payer: Self-pay | Admitting: Internal Medicine

## 2019-10-13 ENCOUNTER — Other Ambulatory Visit: Payer: Self-pay

## 2019-10-16 ENCOUNTER — Ambulatory Visit (INDEPENDENT_AMBULATORY_CARE_PROVIDER_SITE_OTHER): Payer: Medicare HMO | Admitting: Nurse Practitioner

## 2019-10-16 ENCOUNTER — Encounter: Payer: Self-pay | Admitting: Nurse Practitioner

## 2019-10-16 ENCOUNTER — Other Ambulatory Visit: Payer: Self-pay

## 2019-10-16 VITALS — BP 122/82 | HR 93 | Temp 99.1°F | Resp 20 | Ht 68.0 in | Wt 172.0 lb

## 2019-10-16 DIAGNOSIS — E119 Type 2 diabetes mellitus without complications: Secondary | ICD-10-CM

## 2019-10-16 LAB — BAYER DCA HB A1C WAIVED: HB A1C (BAYER DCA - WAIVED): 8.4 % — ABNORMAL HIGH (ref <7.0)

## 2019-10-16 NOTE — Patient Instructions (Signed)
Carbohydrate Counting for Diabetes Mellitus, Adult  Carbohydrate counting is a method of keeping track of how many carbohydrates you eat. Eating carbohydrates naturally increases the amount of sugar (glucose) in the blood. Counting how many carbohydrates you eat helps keep your blood glucose within normal limits, which helps you manage your diabetes (diabetes mellitus). It is important to know how many carbohydrates you can safely have in each meal. This is different for every person. A diet and nutrition specialist (registered dietitian) can help you make a meal plan and calculate how many carbohydrates you should have at each meal and snack. Carbohydrates are found in the following foods:  Grains, such as breads and cereals.  Dried beans and soy products.  Starchy vegetables, such as potatoes, peas, and corn.  Fruit and fruit juices.  Milk and yogurt.  Sweets and snack foods, such as cake, cookies, candy, chips, and soft drinks. How do I count carbohydrates? There are two ways to count carbohydrates in food. You can use either of the methods or a combination of both. Reading "Nutrition Facts" on packaged food The "Nutrition Facts" list is included on the labels of almost all packaged foods and beverages in the [place] It includes:  The serving size.  Information about nutrients in each serving, including the grams (g) of carbohydrate per serving. To use the "Nutrition Facts":  Decide how many servings you will have.  Multiply the number of servings by the number of carbohydrates per serving.  The resulting number is the total amount of carbohydrates that you will be having. Learning standard serving sizes of other foods When you eat carbohydrate foods that are not packaged or do not include "Nutrition Facts" on the label, you need to measure the servings in order to count the amount of carbohydrates:  Measure the foods that you will eat with a food scale or measuring cup, if needed.   Decide how many standard-size servings you will eat.  Multiply the number of servings by 15. Most carbohydrate-rich foods have about 15 g of carbohydrates per serving. ? For example, if you eat 8 oz (170 g) of strawberries, you will have eaten 2 servings and 30 g of carbohydrates (2 servings x 15 g = 30 g).  For foods that have more than one food mixed, such as soups and casseroles, you must count the carbohydrates in each food that is included. The following list contains standard serving sizes of common carbohydrate-rich foods. Each of these servings has about 15 g of carbohydrates:   hamburger bun or  English muffin.   oz (15 mL) syrup.   oz (14 g) jelly.  1 slice of bread.  1 six-inch tortilla.  3 oz (85 g) cooked rice or pasta.  4 oz (113 g) cooked dried beans.  4 oz (113 g) starchy vegetable, such as peas, corn, or potatoes.  4 oz (113 g) hot cereal.  4 oz (113 g) mashed potatoes or  of a large baked potato.  4 oz (113 g) canned or frozen fruit.  4 oz (120 mL) fruit juice.  4-6 crackers.  6 chicken nuggets.  6 oz (170 g) unsweetened dry cereal.  6 oz (170 g) plain fat-free yogurt or yogurt sweetened with artificial sweeteners.  8 oz (240 mL) milk.  8 oz (170 g) fresh fruit or one small piece of fruit.  24 oz (680 g) popped popcorn. Example of carbohydrate counting Sample meal  3 oz (85 g) chicken breast.  6 oz (170 g)   brown rice.  4 oz (113 g) corn.  8 oz (240 mL) milk.  8 oz (170 g) strawberries with sugar-free whipped topping. Carbohydrate calculation 1. Identify the foods that contain carbohydrates: ? Rice. ? Corn. ? Milk. ? Strawberries. 2. Calculate how many servings you have of each food: ? 2 servings rice. ? 1 serving corn. ? 1 serving milk. ? 1 serving strawberries. 3. Multiply each number of servings by 15 g: ? 2 servings rice x 15 g = 30 g. ? 1 serving corn x 15 g = 15 g. ? 1 serving milk x 15 g = 15 g. ? 1 serving  strawberries x 15 g = 15 g. 4. Add together all of the amounts to find the total grams of carbohydrates eaten: ? 30 g + 15 g + 15 g + 15 g = 75 g of carbohydrates total. Summary  Carbohydrate counting is a method of keeping track of how many carbohydrates you eat.  Eating carbohydrates naturally increases the amount of sugar (glucose) in the blood.  Counting how many carbohydrates you eat helps keep your blood glucose within normal limits, which helps you manage your diabetes.  A diet and nutrition specialist (registered dietitian) can help you make a meal plan and calculate how many carbohydrates you should have at each meal and snack. This information is not intended to replace advice given to you by your health care provider. Make sure you discuss any questions you have with your health care provider. Document Released: 11/30/2005 Document Revised: 06/24/2017 Document Reviewed: 05/13/2016 Elsevier Patient Education  2020 Elsevier Inc.  

## 2019-10-16 NOTE — Progress Notes (Signed)
   Subjective:    Patient ID: Patrick Valentine, male    DOB: 12/19/1960, 58 y.o.   MRN: [id number]   Chief Complaint: Recheck Hgb A1c   HPI Patient was seen on 09/13/19 with a hgba1c of 9.2%. we made no change sto his meds because he wanted to try to get it down on his own. He has been watching diet and walking some. His blood sugars have been running 110-180 in mornings. He denies any low blood sugars.      Review of Systems  Constitutional: Negative for activity change and appetite change.  HENT: Negative.   Eyes: Negative for pain.  Respiratory: Negative for shortness of breath.   Cardiovascular: Negative for chest pain, palpitations and leg swelling.  Gastrointestinal: Negative for abdominal pain.  Endocrine: Negative for polydipsia.  Genitourinary: Negative.   Skin: Negative for rash.  Neurological: Negative for dizziness, weakness and headaches.  Hematological: Does not bruise/bleed easily.  Psychiatric/Behavioral: Negative.   All other systems reviewed and are negative.      Objective:   Physical Exam Vitals signs and nursing note reviewed.  Constitutional:      Appearance: Normal appearance. He is well-developed.  HENT:     Head: Normocephalic.     Nose: Nose normal.  Eyes:     Pupils: Pupils are equal, round, and reactive to light.  Neck:     Musculoskeletal: Normal range of motion and neck supple.     Thyroid: No thyroid mass or thyromegaly.     Vascular: No carotid bruit or JVD.     Trachea: Phonation normal.  Cardiovascular:     Rate and Rhythm: Normal rate and regular rhythm.  Pulmonary:     Effort: Pulmonary effort is normal. No respiratory distress.     Breath sounds: Normal breath sounds.  Abdominal:     General: Bowel sounds are normal.     Palpations: Abdomen is soft.     Tenderness: There is no abdominal tenderness.  Musculoskeletal: Normal range of motion.  Lymphadenopathy:     Cervical: No cervical adenopathy.  Skin:    General: Skin is  warm and dry.  Neurological:     Mental Status: He is alert and oriented to person, place, and time.  Psychiatric:        Behavior: Behavior normal.        Thought Content: Thought content normal.        Judgment: Judgment normal.    BP 122/82   Pulse 93   Temp 99.1 F (37.3 C) (Temporal)   Resp 20   Ht 5\' 8"  (1.727 m)   Wt 172 lb (78 kg)   SpO2 99%   BMI 26.15 kg/m   HGBA1c 8.4%      Assessment & Plan:  Angelino Sauder in today with chief complaint of Recheck Hgb A1c   1. Type 2 diabetes mellitus not at goal [facility]) continue to watch diet exercise when can - Bayer DCA Hb A1c Waived Follow up in 3 months  [place], FNP

## 2019-10-16 NOTE — Addendum Note (Signed)
Addended by: Chevis Pretty on: 10/16/2019 09:43 AM   Modules accepted: Orders

## 2019-10-17 LAB — MICROALBUMIN / CREATININE URINE RATIO
Creatinine, Urine: 23.3 mg/dL
Microalb/Creat Ratio: 13 mg/g creat (ref 0–29)
Microalbumin, Urine: 3.1 ug/mL

## 2020-01-18 ENCOUNTER — Other Ambulatory Visit: Payer: Self-pay

## 2020-01-19 ENCOUNTER — Encounter: Payer: Self-pay | Admitting: Nurse Practitioner

## 2020-01-19 ENCOUNTER — Ambulatory Visit (INDEPENDENT_AMBULATORY_CARE_PROVIDER_SITE_OTHER): Payer: Medicare HMO | Admitting: Nurse Practitioner

## 2020-01-19 ENCOUNTER — Ambulatory Visit (INDEPENDENT_AMBULATORY_CARE_PROVIDER_SITE_OTHER): Payer: Medicare HMO

## 2020-01-19 VITALS — BP 132/87 | HR 89 | Temp 97.2°F | Resp 20 | Ht 68.0 in | Wt 168.0 lb

## 2020-01-19 DIAGNOSIS — E1169 Type 2 diabetes mellitus with other specified complication: Secondary | ICD-10-CM | POA: Diagnosis not present

## 2020-01-19 DIAGNOSIS — Z6826 Body mass index (BMI) 26.0-26.9, adult: Secondary | ICD-10-CM | POA: Diagnosis not present

## 2020-01-19 DIAGNOSIS — E785 Hyperlipidemia, unspecified: Secondary | ICD-10-CM

## 2020-01-19 DIAGNOSIS — E119 Type 2 diabetes mellitus without complications: Secondary | ICD-10-CM | POA: Diagnosis not present

## 2020-01-19 LAB — BAYER DCA HB A1C WAIVED: HB A1C (BAYER DCA - WAIVED): 8.6 % — ABNORMAL HIGH (ref <7.0)

## 2020-01-19 MED ORDER — METFORMIN HCL 1000 MG PO TABS: 1000.0000 mg | ORAL_TABLET | Freq: Two times a day (BID) | ORAL | 1 refills | Status: DC

## 2020-01-19 MED ORDER — SITAGLIPTIN PHOSPHATE 100 MG PO TABS: 100.0000 mg | ORAL_TABLET | Freq: Every day | ORAL | 1 refills | Status: DC

## 2020-01-19 MED ORDER — ROSUVASTATIN CALCIUM 20 MG PO TABS: 20.0000 mg | ORAL_TABLET | Freq: Every day | ORAL | 1 refills | Status: DC

## 2020-01-19 NOTE — Patient Instructions (Signed)
Carbohydrate Counting for Diabetes Mellitus, Adult  Carbohydrate counting is a method of keeping track of how many carbohydrates you eat. Eating carbohydrates naturally increases the amount of sugar (glucose) in the blood. Counting how many carbohydrates you eat helps keep your blood glucose within normal limits, which helps you manage your diabetes (diabetes mellitus). It is important to know how many carbohydrates you can safely have in each meal. This is different for every person. A diet and nutrition specialist (registered dietitian) can help you make a meal plan and calculate how many carbohydrates you should have at each meal and snack. Carbohydrates are found in the following foods:  Grains, such as breads and cereals.  Dried beans and soy products.  Starchy vegetables, such as potatoes, peas, and corn.  Fruit and fruit juices.  Milk and yogurt.  Sweets and snack foods, such as cake, cookies, candy, chips, and soft drinks. How do I count carbohydrates? There are two ways to count carbohydrates in food. You can use either of the methods or a combination of both. Reading "Nutrition Facts" on packaged food The "Nutrition Facts" list is included on the labels of almost all packaged foods and beverages in the [place] It includes:  The serving size.  Information about nutrients in each serving, including the grams (g) of carbohydrate per serving. To use the "Nutrition Facts":  Decide how many servings you will have.  Multiply the number of servings by the number of carbohydrates per serving.  The resulting number is the total amount of carbohydrates that you will be having. Learning standard serving sizes of other foods When you eat carbohydrate foods that are not packaged or do not include "Nutrition Facts" on the label, you need to measure the servings in order to count the amount of carbohydrates:  Measure the foods that you will eat with a food scale or measuring cup, if  needed.  Decide how many standard-size servings you will eat.  Multiply the number of servings by 15. Most carbohydrate-rich foods have about 15 g of carbohydrates per serving. ? For example, if you eat 8 oz (170 g) of strawberries, you will have eaten 2 servings and 30 g of carbohydrates (2 servings x 15 g = 30 g).  For foods that have more than one food mixed, such as soups and casseroles, you must count the carbohydrates in each food that is included. The following list contains standard serving sizes of common carbohydrate-rich foods. Each of these servings has about 15 g of carbohydrates:   hamburger bun or  English muffin.   oz (15 mL) syrup.   oz (14 g) jelly.  1 slice of bread.  1 six-inch tortilla.  3 oz (85 g) cooked rice or pasta.  4 oz (113 g) cooked dried beans.  4 oz (113 g) starchy vegetable, such as peas, corn, or potatoes.  4 oz (113 g) hot cereal.  4 oz (113 g) mashed potatoes or  of a large baked potato.  4 oz (113 g) canned or frozen fruit.  4 oz (120 mL) fruit juice.  4-6 crackers.  6 chicken nuggets.  6 oz (170 g) unsweetened dry cereal.  6 oz (170 g) plain fat-free yogurt or yogurt sweetened with artificial sweeteners.  8 oz (240 mL) milk.  8 oz (170 g) fresh fruit or one small piece of fruit.  24 oz (680 g) popped popcorn. Example of carbohydrate counting Sample meal  3 oz (85 g) chicken breast.  6 oz (170 g)   brown rice.  4 oz (113 g) corn.  8 oz (240 mL) milk.  8 oz (170 g) strawberries with sugar-free whipped topping. Carbohydrate calculation 1. Identify the foods that contain carbohydrates: ? Rice. ? Corn. ? Milk. ? Strawberries. 2. Calculate how many servings you have of each food: ? 2 servings rice. ? 1 serving corn. ? 1 serving milk. ? 1 serving strawberries. 3. Multiply each number of servings by 15 g: ? 2 servings rice x 15 g = 30 g. ? 1 serving corn x 15 g = 15 g. ? 1 serving milk x 15 g = 15 g. ? 1  serving strawberries x 15 g = 15 g. 4. Add together all of the amounts to find the total grams of carbohydrates eaten: ? 30 g + 15 g + 15 g + 15 g = 75 g of carbohydrates total. Summary  Carbohydrate counting is a method of keeping track of how many carbohydrates you eat.  Eating carbohydrates naturally increases the amount of sugar (glucose) in the blood.  Counting how many carbohydrates you eat helps keep your blood glucose within normal limits, which helps you manage your diabetes.  A diet and nutrition specialist (registered dietitian) can help you make a meal plan and calculate how many carbohydrates you should have at each meal and snack. This information is not intended to replace advice given to you by your health care provider. Make sure you discuss any questions you have with your health care provider. Document Revised: 06/24/2017 Document Reviewed: 05/13/2016 Elsevier Patient Education  2020 Elsevier Inc.  
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
## 2020-01-19 NOTE — Progress Notes (Deleted)
   Subjective:    Patient ID: Patrick Valentine, male    DOB: 06-Jun-1961, 59 y.o.   MRN: [id number]   Chief Complaint: No chief complaint on file.    HPI:  1. Hyperlipidemia associated with type 2 diabetes mellitus (HCC) ***  2. Type 2 diabetes mellitus not at goal [facility]) ***  3. BMI 26.0-26.9,adult ***    Outpatient Encounter Medications as of 01/19/2020  Medication Sig  . ACCU-CHEK SOFTCLIX LANCETS lancets Test BID  . Alcohol Swabs (B-D SINGLE USE SWABS REGULAR) PADS Test BID  . Blood Glucose Calibration (ACCU-CHEK AVIVA) SOLN Use with glucose meter  . Blood Glucose Calibration (ACCU-CHEK SMARTVIEW CONTROL) LIQD Use with meters as directed  . Blood Glucose Monitoring Suppl (ACCU-CHEK AVIVA PLUS) w/Device KIT Test BID  . glucose blood (ACCU-CHEK AVIVA PLUS) test strip Test BID  . Lancet Devices (ONE TOUCH DELICA LANCING DEV) MISC Use to check blood sugar daily  . Lancets Misc. (ACCU-CHEK FASTCLIX LANCET) KIT Test BID  . metFORMIN (GLUCOPHAGE) 1000 MG tablet Take 1 tablet (1,000 mg total) by mouth 2 (two) times daily with a meal.  . rosuvastatin (CRESTOR) 20 MG tablet Take 1 tablet (20 mg total) by mouth daily.     Past Surgical History:  Procedure Laterality Date  . NO PAST SURGERIES      Family History  Problem Relation Age of Onset  . Diabetes Father 30       IDDM  . Diabetes Sister 30       IDDM  . Colon cancer Neg Hx     New complaints: ***  Social history: ***  Controlled substance contract: ***    Review of Systems     Objective:   Physical Exam        Assessment & Plan:

## 2020-01-20 LAB — CBC WITH DIFFERENTIAL/PLATELET
Basophils Absolute: 0.1 10*3/uL (ref 0.0–0.2)
Basos: 1 %
EOS (ABSOLUTE): 0.2 10*3/uL (ref 0.0–0.4)
Eos: 3 %
Hematocrit: 47.6 % (ref 37.5–51.0)
Hemoglobin: 16.6 g/dL (ref 13.0–17.7)
Immature Grans (Abs): 0 10*3/uL (ref 0.0–0.1)
Immature Granulocytes: 0 %
Lymphocytes Absolute: 2.6 10*3/uL (ref 0.7–3.1)
Lymphs: 34 %
MCH: 31.2 pg (ref 26.6–33.0)
MCHC: 34.9 g/dL (ref 31.5–35.7)
MCV: 90 fL (ref 79–97)
Monocytes Absolute: 0.7 10*3/uL (ref 0.1–0.9)
Monocytes: 9 %
Neutrophils Absolute: 4.1 10*3/uL (ref 1.4–7.0)
Neutrophils: 53 %
Platelets: 310 10*3/uL (ref 150–450)
RBC: 5.32 x10E6/uL (ref 4.14–5.80)
RDW: 12.6 % (ref 11.6–15.4)
WBC: 7.8 10*3/uL (ref 3.4–10.8)

## 2020-01-20 LAB — CMP14+EGFR
ALT: 18 IU/L (ref 0–44)
AST: 14 IU/L (ref 0–40)
Albumin/Globulin Ratio: 2 (ref 1.2–2.2)
Albumin: 4.8 g/dL (ref 3.8–4.9)
Alkaline Phosphatase: 64 IU/L (ref 39–117)
BUN/Creatinine Ratio: 12 (ref 9–20)
BUN: 11 mg/dL (ref 6–24)
Bilirubin Total: 0.5 mg/dL (ref 0.0–1.2)
CO2: 24 mmol/L (ref 20–29)
Calcium: 10.2 mg/dL (ref 8.7–10.2)
Chloride: 97 mmol/L (ref 96–106)
Creatinine, Ser: 0.95 mg/dL (ref 0.76–1.27)
GFR calc Af Amer: 101 mL/min/1.73
GFR calc non Af Amer: 87 mL/min/1.73
Globulin, Total: 2.4 g/dL (ref 1.5–4.5)
Glucose: 202 mg/dL — ABNORMAL HIGH (ref 65–99)
Potassium: 4.8 mmol/L (ref 3.5–5.2)
Sodium: 135 mmol/L (ref 134–144)
Total Protein: 7.2 g/dL (ref 6.0–8.5)

## 2020-01-20 LAB — LIPID PANEL
Chol/HDL Ratio: 4.4 ratio (ref 0.0–5.0)
Cholesterol, Total: 189 mg/dL (ref 100–199)
HDL: 43 mg/dL
LDL Chol Calc (NIH): 119 mg/dL — ABNORMAL HIGH (ref 0–99)
Triglycerides: 154 mg/dL — ABNORMAL HIGH (ref 0–149)
VLDL Cholesterol Cal: 27 mg/dL (ref 5–40)

## 2020-01-20 LAB — PSA, TOTAL AND FREE
PSA, Free Pct: 14.5 %
PSA, Free: 0.64 ng/mL
Prostate Specific Ag, Serum: 4.4 ng/mL — ABNORMAL HIGH (ref 0.0–4.0)

## 2020-01-22 ENCOUNTER — Other Ambulatory Visit: Payer: Self-pay | Admitting: Nurse Practitioner

## 2020-01-22 DIAGNOSIS — R972 Elevated prostate specific antigen [PSA]: Secondary | ICD-10-CM

## 2020-01-22 NOTE — Progress Notes (Signed)
Referral made to urology 

## 2020-02-19 DIAGNOSIS — R972 Elevated prostate specific antigen [PSA]: Secondary | ICD-10-CM | POA: Diagnosis not present

## 2020-02-22 DIAGNOSIS — Z23 Encounter for immunization: Secondary | ICD-10-CM | POA: Diagnosis not present

## 2020-04-23 ENCOUNTER — Ambulatory Visit (INDEPENDENT_AMBULATORY_CARE_PROVIDER_SITE_OTHER): Payer: Medicare HMO | Admitting: Nurse Practitioner

## 2020-04-23 ENCOUNTER — Other Ambulatory Visit: Payer: Self-pay

## 2020-04-23 ENCOUNTER — Encounter: Payer: Self-pay | Admitting: Nurse Practitioner

## 2020-04-23 VITALS — BP 135/86 | HR 84 | Temp 98.3°F | Resp 20 | Ht 68.0 in | Wt 171.0 lb

## 2020-04-23 DIAGNOSIS — Z6826 Body mass index (BMI) 26.0-26.9, adult: Secondary | ICD-10-CM

## 2020-04-23 DIAGNOSIS — Z86010 Personal history of colonic polyps: Secondary | ICD-10-CM

## 2020-04-23 DIAGNOSIS — E119 Type 2 diabetes mellitus without complications: Secondary | ICD-10-CM

## 2020-04-23 DIAGNOSIS — E785 Hyperlipidemia, unspecified: Secondary | ICD-10-CM

## 2020-04-23 DIAGNOSIS — E1169 Type 2 diabetes mellitus with other specified complication: Secondary | ICD-10-CM | POA: Diagnosis not present

## 2020-04-23 LAB — LIPID PANEL
Chol/HDL Ratio: 3.4 ratio (ref 0.0–5.0)
Cholesterol, Total: 138 mg/dL (ref 100–199)
HDL: 41 mg/dL (ref >39)
LDL Chol Calc (NIH): 75 mg/dL (ref 0–99)
Triglycerides: 125 mg/dL (ref 0–149)
VLDL Cholesterol Cal: 22 mg/dL (ref 5–40)

## 2020-04-23 LAB — CBC WITH DIFFERENTIAL/PLATELET
Basophils Absolute: 0 10*3/uL (ref 0.0–0.2)
Basos: 1 %
EOS (ABSOLUTE): 0.2 10*3/uL (ref 0.0–0.4)
Eos: 3 %
Hematocrit: 46 % (ref 37.5–51.0)
Hemoglobin: 15.2 g/dL (ref 13.0–17.7)
Immature Grans (Abs): 0 10*3/uL (ref 0.0–0.1)
Immature Granulocytes: 0 %
Lymphocytes Absolute: 2.7 10*3/uL (ref 0.7–3.1)
Lymphs: 37 %
MCH: 30.7 pg (ref 26.6–33.0)
MCHC: 33 g/dL (ref 31.5–35.7)
MCV: 93 fL (ref 79–97)
Monocytes Absolute: 0.7 10*3/uL (ref 0.1–0.9)
Monocytes: 9 %
Neutrophils Absolute: 3.6 10*3/uL (ref 1.4–7.0)
Neutrophils: 50 %
Platelets: 277 10*3/uL (ref 150–450)
RBC: 4.95 x10E6/uL (ref 4.14–5.80)
RDW: 12.8 % (ref 11.6–15.4)
WBC: 7.2 10*3/uL (ref 3.4–10.8)

## 2020-04-23 LAB — CMP14+EGFR
ALT: 14 IU/L (ref 0–44)
AST: 15 IU/L (ref 0–40)
Albumin/Globulin Ratio: 2.4 — ABNORMAL HIGH (ref 1.2–2.2)
Albumin: 4.5 g/dL (ref 3.8–4.9)
Alkaline Phosphatase: 52 IU/L (ref 39–117)
BUN/Creatinine Ratio: 9 (ref 9–20)
BUN: 7 mg/dL (ref 6–24)
Bilirubin Total: 0.4 mg/dL (ref 0.0–1.2)
CO2: 22 mmol/L (ref 20–29)
Calcium: 9.9 mg/dL (ref 8.7–10.2)
Chloride: 99 mmol/L (ref 96–106)
Creatinine, Ser: 0.75 mg/dL — ABNORMAL LOW (ref 0.76–1.27)
GFR calc Af Amer: 116 mL/min/{1.73_m2} (ref >59)
GFR calc non Af Amer: 100 mL/min/{1.73_m2} (ref >59)
Globulin, Total: 1.9 g/dL (ref 1.5–4.5)
Glucose: 147 mg/dL — ABNORMAL HIGH (ref 65–99)
Potassium: 4.7 mmol/L (ref 3.5–5.2)
Sodium: 135 mmol/L (ref 134–144)
Total Protein: 6.4 g/dL (ref 6.0–8.5)

## 2020-04-23 LAB — BAYER DCA HB A1C WAIVED: HB A1C (BAYER DCA - WAIVED): 7.2 % — ABNORMAL HIGH (ref <7.0)

## 2020-04-23 MED ORDER — METFORMIN HCL 1000 MG PO TABS: 1000.0000 mg | ORAL_TABLET | Freq: Two times a day (BID) | ORAL | 1 refills | Status: DC

## 2020-04-23 MED ORDER — SITAGLIPTIN PHOSPHATE 100 MG PO TABS: 100.0000 mg | ORAL_TABLET | Freq: Every day | ORAL | 1 refills | Status: DC

## 2020-04-23 MED ORDER — ROSUVASTATIN CALCIUM 20 MG PO TABS: 20.0000 mg | ORAL_TABLET | Freq: Every day | ORAL | 1 refills | Status: DC

## 2020-04-23 NOTE — Patient Instructions (Signed)
Carbohydrate Counting for Diabetes Mellitus, Adult  Carbohydrate counting is a method of keeping track of how many carbohydrates you eat. Eating carbohydrates naturally increases the amount of sugar (glucose) in the blood. Counting how many carbohydrates you eat helps keep your blood glucose within normal limits, which helps you manage your diabetes (diabetes mellitus). It is important to know how many carbohydrates you can safely have in each meal. This is different for every person. A diet and nutrition specialist (registered dietitian) can help you make a meal plan and calculate how many carbohydrates you should have at each meal and snack. Carbohydrates are found in the following foods:  Grains, such as breads and cereals.  Dried beans and soy products.  Starchy vegetables, such as potatoes, peas, and corn.  Fruit and fruit juices.  Milk and yogurt.  Sweets and snack foods, such as cake, cookies, candy, chips, and soft drinks. How do I count carbohydrates? There are two ways to count carbohydrates in food. You can use either of the methods or a combination of both. Reading "Nutrition Facts" on packaged food The "Nutrition Facts" list is included on the labels of almost all packaged foods and beverages in the [place] It includes:  The serving size.  Information about nutrients in each serving, including the grams (g) of carbohydrate per serving. To use the "Nutrition Facts":  Decide how many servings you will have.  Multiply the number of servings by the number of carbohydrates per serving.  The resulting number is the total amount of carbohydrates that you will be having. Learning standard serving sizes of other foods When you eat carbohydrate foods that are not packaged or do not include "Nutrition Facts" on the label, you need to measure the servings in order to count the amount of carbohydrates:  Measure the foods that you will eat with a food scale or measuring cup, if  needed.  Decide how many standard-size servings you will eat.  Multiply the number of servings by 15. Most carbohydrate-rich foods have about 15 g of carbohydrates per serving. ? For example, if you eat 8 oz (170 g) of strawberries, you will have eaten 2 servings and 30 g of carbohydrates (2 servings x 15 g = 30 g).  For foods that have more than one food mixed, such as soups and casseroles, you must count the carbohydrates in each food that is included. The following list contains standard serving sizes of common carbohydrate-rich foods. Each of these servings has about 15 g of carbohydrates:   hamburger bun or  English muffin.   oz (15 mL) syrup.   oz (14 g) jelly.  1 slice of bread.  1 six-inch tortilla.  3 oz (85 g) cooked rice or pasta.  4 oz (113 g) cooked dried beans.  4 oz (113 g) starchy vegetable, such as peas, corn, or potatoes.  4 oz (113 g) hot cereal.  4 oz (113 g) mashed potatoes or  of a large baked potato.  4 oz (113 g) canned or frozen fruit.  4 oz (120 mL) fruit juice.  4-6 crackers.  6 chicken nuggets.  6 oz (170 g) unsweetened dry cereal.  6 oz (170 g) plain fat-free yogurt or yogurt sweetened with artificial sweeteners.  8 oz (240 mL) milk.  8 oz (170 g) fresh fruit or one small piece of fruit.  24 oz (680 g) popped popcorn. Example of carbohydrate counting Sample meal  3 oz (85 g) chicken breast.  6 oz (170 g)   brown rice.  4 oz (113 g) corn.  8 oz (240 mL) milk.  8 oz (170 g) strawberries with sugar-free whipped topping. Carbohydrate calculation 1. Identify the foods that contain carbohydrates: ? Rice. ? Corn. ? Milk. ? Strawberries. 2. Calculate how many servings you have of each food: ? 2 servings rice. ? 1 serving corn. ? 1 serving milk. ? 1 serving strawberries. 3. Multiply each number of servings by 15 g: ? 2 servings rice x 15 g = 30 g. ? 1 serving corn x 15 g = 15 g. ? 1 serving milk x 15 g = 15 g. ? 1  serving strawberries x 15 g = 15 g. 4. Add together all of the amounts to find the total grams of carbohydrates eaten: ? 30 g + 15 g + 15 g + 15 g = 75 g of carbohydrates total. Summary  Carbohydrate counting is a method of keeping track of how many carbohydrates you eat.  Eating carbohydrates naturally increases the amount of sugar (glucose) in the blood.  Counting how many carbohydrates you eat helps keep your blood glucose within normal limits, which helps you manage your diabetes.  A diet and nutrition specialist (registered dietitian) can help you make a meal plan and calculate how many carbohydrates you should have at each meal and snack. This information is not intended to replace advice given to you by your health care provider. Make sure you discuss any questions you have with your health care provider. Document Revised: 06/24/2017 Document Reviewed: 05/13/2016 Elsevier Patient Education  2020 Elsevier Inc.  

## 2020-04-23 NOTE — Progress Notes (Signed)
Subjective:    Patient ID: Patrick Valentine, male    DOB: Jul 28, 1961, 59 y.o.   MRN: [id number]   Chief Complaint:  Medical management of chronic issues  HPI:  1. Hyperlipidemia associated with type 2 diabetes mellitus ([place]) Has been watching diet. He has not been exercising but he is taying very busy. Lab Results  Component Value Date   CHOL 189 01/19/2020   HDL 43 01/19/2020   LDLCALC 119 (H) 01/19/2020   TRIG 154 (H) 01/19/2020   CHOLHDL 4.4 01/19/2020     2. Type 2 diabetes mellitus not at goal [facility]) Fating blood sugars are running 140-170. He denies any low blood sugars. Lab Results  Component Value Date   HGBA1C 8.6 (H) 01/19/2020     3. BMI 26.0-26.9,adult Weight is unchanged Wt Readings from Last 3 Encounters:  04/23/20 171 lb (77.6 kg)  01/19/20 168 lb (76.2 kg)  10/16/19 172 lb (78 kg)   BMI Readings from Last 3 Encounters:  04/23/20 26.00 kg/m  01/19/20 25.54 kg/m  10/16/19 26.15 kg/m       Outpatient Encounter Medications as of 04/23/2020  Medication Sig  . ACCU-CHEK SOFTCLIX LANCETS lancets Test BID  . Alcohol Swabs (B-D SINGLE USE SWABS REGULAR) PADS Test BID  . Blood Glucose Calibration (ACCU-CHEK AVIVA) SOLN Use with glucose meter  . Blood Glucose Calibration (ACCU-CHEK SMARTVIEW CONTROL) LIQD Use with meters as directed  . Blood Glucose Monitoring Suppl (ACCU-CHEK AVIVA PLUS) w/Device KIT Test BID  . glucose blood (ACCU-CHEK AVIVA PLUS) test strip Test BID  . Lancet Devices (ONE TOUCH DELICA LANCING DEV) MISC Use to check blood sugar daily  . Lancets Misc. (ACCU-CHEK FASTCLIX LANCET) KIT Test BID  . metFORMIN (GLUCOPHAGE) 1000 MG tablet Take 1 tablet (1,000 mg total) by mouth 2 (two) times daily with a meal.  . rosuvastatin (CRESTOR) 20 MG tablet Take 1 tablet (20 mg total) by mouth daily.  . sitaGLIPtin (JANUVIA) 100 MG tablet Take 1 tablet (100 mg total) by mouth daily.     Past Surgical History:  Procedure Laterality Date  . NO  PAST SURGERIES      Family History  Problem Relation Age of Onset  . Diabetes Father 30       IDDM  . Diabetes Sister 30       IDDM  . Colon cancer Neg Hx     New complaints: None today  Social history: Lives with wife. I retired from work  Controlled substance contract: n/a    Review of Systems  Constitutional: Negative for diaphoresis.  Eyes: Negative for pain.  Respiratory: Negative for shortness of breath.   Cardiovascular: Negative for chest pain, palpitations and leg swelling.  Gastrointestinal: Negative for abdominal pain.  Endocrine: Negative for polydipsia.  Skin: Negative for rash.  Neurological: Negative for dizziness, weakness and headaches.  Hematological: Does not bruise/bleed easily.  All other systems reviewed and are negative.      Objective:   Physical Exam Vitals and nursing note reviewed.  Constitutional:      Appearance: Normal appearance. He is well-developed.  HENT:     Head: Normocephalic.     Nose: Nose normal.  Eyes:     Pupils: Pupils are equal, round, and reactive to light.  Neck:     Thyroid: No thyroid mass or thyromegaly.     Vascular: No carotid bruit or JVD.     Trachea: Phonation normal.  Cardiovascular:     Rate and Rhythm: Normal rate  and regular rhythm.  Pulmonary:     Effort: Pulmonary effort is normal. No respiratory distress.     Breath sounds: Normal breath sounds.  Abdominal:     General: Bowel sounds are normal.     Palpations: Abdomen is soft.     Tenderness: There is no abdominal tenderness.  Musculoskeletal:        General: Normal range of motion.     Cervical back: Normal range of motion and neck supple.  Lymphadenopathy:     Cervical: No cervical adenopathy.  Skin:    General: Skin is warm and dry.  Neurological:     Mental Status: He is alert and oriented to person, place, and time.  Psychiatric:        Behavior: Behavior normal.        Thought Content: Thought content normal.        Judgment:  Judgment normal.    BP 135/86   Pulse 84   Temp 98.3 F (36.8 C) (Temporal)   Resp 20   Ht 5' 8" (1.727 m)   Wt 171 lb (77.6 kg)   SpO2 100%   BMI 26.00 kg/m   HGBA1c 7.2      Assessment & Plan:  Patrick Valentine comes in today with chief complaint of Medical Management of Chronic Issues   Diagnosis and orders addressed:  1. Hyperlipidemia associated with type 2 diabetes mellitus (HCC) Low fat diet - CBC with Differential/Platelet - CMP14+EGFR - Lipid panel - rosuvastatin (CRESTOR) 20 MG tablet; Take 1 tablet (20 mg total) by mouth daily.  Dispense: 90 tablet; Refill: 1  2. Type 2 diabetes mellitus not at goal (HCC) Continue to watch carbs in diet - Bayer DCA Hb A1c Waived - sitaGLIPtin (JANUVIA) 100 MG tablet; Take 1 tablet (100 mg total) by mouth daily.  Dispense: 90 tablet; Refill: 1 - metFORMIN (GLUCOPHAGE) 1000 MG tablet; Take 1 tablet (1,000 mg total) by mouth 2 (two) times daily with a meal.  Dispense: 180 tablet; Refill: 1  3. BMI 26.0-26.9,adult Discussed diet and exercise for person with BMI >25 Will recheck weight in 3-6 months  4. History of colon polyps - Ambulatory referral to Gastroenterology   Labs pending Health Maintenance reviewed Diet and exercise encouraged  Follow up plan: 3 months   Mary-Margaret Martin, FNP  

## 2020-04-24 ENCOUNTER — Encounter: Payer: Self-pay | Admitting: Internal Medicine

## 2020-05-16 DIAGNOSIS — Z789 Other specified health status: Secondary | ICD-10-CM | POA: Insufficient documentation

## 2020-05-16 DIAGNOSIS — H3552 Pigmentary retinal dystrophy: Secondary | ICD-10-CM | POA: Insufficient documentation

## 2020-05-16 DIAGNOSIS — Z7409 Other reduced mobility: Secondary | ICD-10-CM | POA: Insufficient documentation

## 2020-05-20 ENCOUNTER — Encounter: Payer: Self-pay | Admitting: Family Medicine

## 2020-05-20 DIAGNOSIS — R972 Elevated prostate specific antigen [PSA]: Secondary | ICD-10-CM | POA: Diagnosis not present

## 2020-05-29 ENCOUNTER — Other Ambulatory Visit: Payer: Self-pay

## 2020-05-29 ENCOUNTER — Ambulatory Visit (AMBULATORY_SURGERY_CENTER): Payer: Self-pay | Admitting: *Deleted

## 2020-05-29 VITALS — Ht 68.0 in | Wt 170.0 lb

## 2020-05-29 DIAGNOSIS — Z86010 Personal history of colonic polyps: Secondary | ICD-10-CM

## 2020-05-29 MED ORDER — SUTAB 1479-225-188 MG PO TABS: 1.0000 | ORAL_TABLET | Freq: Once | ORAL | 0 refills | Status: AC

## 2020-05-29 NOTE — Progress Notes (Signed)

## 2020-05-31 ENCOUNTER — Encounter: Payer: Self-pay | Admitting: Internal Medicine

## 2020-06-12 ENCOUNTER — Other Ambulatory Visit: Payer: Self-pay

## 2020-06-12 ENCOUNTER — Ambulatory Visit (AMBULATORY_SURGERY_CENTER): Payer: Medicare HMO | Admitting: Internal Medicine

## 2020-06-12 ENCOUNTER — Encounter: Payer: Self-pay | Admitting: Internal Medicine

## 2020-06-12 VITALS — BP 120/75 | HR 69 | Temp 97.3°F | Resp 10 | Ht 68.0 in | Wt 170.0 lb

## 2020-06-12 DIAGNOSIS — D123 Benign neoplasm of transverse colon: Secondary | ICD-10-CM

## 2020-06-12 DIAGNOSIS — D125 Benign neoplasm of sigmoid colon: Secondary | ICD-10-CM | POA: Diagnosis not present

## 2020-06-12 DIAGNOSIS — D128 Benign neoplasm of rectum: Secondary | ICD-10-CM | POA: Diagnosis not present

## 2020-06-12 DIAGNOSIS — Z86010 Personal history of colonic polyps: Secondary | ICD-10-CM

## 2020-06-12 DIAGNOSIS — D127 Benign neoplasm of rectosigmoid junction: Secondary | ICD-10-CM | POA: Diagnosis not present

## 2020-06-12 DIAGNOSIS — Z1211 Encounter for screening for malignant neoplasm of colon: Secondary | ICD-10-CM | POA: Diagnosis not present

## 2020-06-12 MED ORDER — SODIUM CHLORIDE 0.9 % IV SOLN: 500.0000 mL | Freq: Once | INTRAVENOUS | Status: DC

## 2020-06-12 NOTE — Patient Instructions (Signed)

## 2020-06-12 NOTE — Op Note (Signed)
[place] Patient Name: Patrick Valentine Procedure Date: 06/12/2020 10:33 AM MRN: [id number] Endoscopist: Jerene Bears , MD Age: 59 Referring MD:  Date of Birth: [birth year] Gender: Male Account #: [id number] Procedure:                Colonoscopy Indications:              High risk colon cancer surveillance: Personal                            history of non-advanced adenoma, Last colonoscopy:                            October 2015 Medicines:                Monitored Anesthesia Care Procedure:                Pre-Anesthesia Assessment:                           - Prior to the procedure, a History and Physical                            was performed, and patient medications and                            allergies were reviewed. The patient's tolerance of                            previous anesthesia was also reviewed. The risks                            and benefits of the procedure and the sedation                            options and risks were discussed with the patient.                            All questions were answered, and informed consent                            was obtained. Prior Anticoagulants: The patient has                            taken no previous anticoagulant or antiplatelet                            agents. ASA Grade Assessment: II - A patient with                            mild systemic disease. After reviewing the risks                            and benefits, the patient was deemed in  satisfactory condition to undergo the procedure.                           After obtaining informed consent, the colonoscope                            was passed under direct vision. Throughout the                            procedure, the patient's blood pressure, pulse, and                            oxygen saturations were monitored continuously. The                            Colonoscope was introduced through the anus and                             advanced to the cecum, identified by appendiceal                            orifice and ileocecal valve. The colonoscopy was                            performed without difficulty. The patient tolerated                            the procedure well. The quality of the bowel                            preparation was good. The ileocecal valve,                            appendiceal orifice, and rectum were photographed. Scope In: 10:50:57 AM Scope Out: 11:08:40 AM Scope Withdrawal Time: 0 hours 11 minutes 11 seconds  Total Procedure Duration: 0 hours 17 minutes 43 seconds  Findings:                 The digital rectal exam was normal.                           A 4 mm polyp was found in the transverse colon. The                            polyp was sessile. The polyp was removed with a                            cold snare. Resection and retrieval were complete.                           A 4 mm polyp was found in the sigmoid colon. The                            polyp was  sessile. The polyp was removed with a                            cold snare. Resection and retrieval were complete.                           A 3 mm polyp was found in the distal rectum. The                            polyp was sessile. The polyp was removed with a                            cold snare. Resection and retrieval were complete.                           Multiple small-mouthed diverticula were found in                            the sigmoid colon.                           Internal hemorrhoids were found during                            retroflexion. The hemorrhoids were small. Complications:            No immediate complications. Estimated Blood Loss:     Estimated blood loss was minimal. Impression:               - One 4 mm polyp in the transverse colon, removed                            with a cold snare. Resected and retrieved.                           - One 4 mm polyp in the  sigmoid colon, removed with                            a cold snare. Resected and retrieved.                           - One 3 mm polyp in the distal rectum, removed with                            a cold snare. Resected and retrieved.                           - Diverticulosis in the sigmoid colon.                           - Small internal hemorrhoids. Recommendation:           - Patient has a contact number available for  emergencies. The signs and symptoms of potential                            delayed complications were discussed with the                            patient. Return to normal activities tomorrow.                            Written discharge instructions were provided to the                            patient.                           - Resume previous diet.                           - Continue present medications.                           - Await pathology results.                           - Repeat colonoscopy is recommended for                            surveillance. The colonoscopy date will be                            determined after pathology results from today's                            exam become available for review. Jerene Bears, MD 06/12/2020 11:11:51 AM This report has been signed electronically.

## 2020-06-12 NOTE — Progress Notes (Signed)
Vitals-CW  Pt's states no medical or surgical changes since previsit or office visit. 

## 2020-06-12 NOTE — Progress Notes (Signed)
To PACU, VSS. Report to RN. TB

## 2020-06-12 NOTE — Progress Notes (Signed)
Called to room to assist during endoscopic procedure.  Patient ID and intended procedure confirmed with present staff. Received instructions for my participation in the procedure from the performing physician.  

## 2020-06-13 ENCOUNTER — Telehealth: Payer: Self-pay | Admitting: Nurse Practitioner

## 2020-06-14 ENCOUNTER — Telehealth: Payer: Self-pay

## 2020-06-14 NOTE — Telephone Encounter (Signed)
Please call.

## 2020-06-14 NOTE — Telephone Encounter (Signed)
Called patient and discussed visit with urology. He is requesting PSA level that was drawn during that appt 1 month ago. States that he has contacted the urology office several times and has not been able to get result. Advised patient I would call the office and call him back.

## 2020-06-14 NOTE — Telephone Encounter (Signed)
Covid-19 screening questions   Do you now or have you had a fever in the last 14 days? No.  Do you have any respiratory symptoms of shortness of breath or cough now or in the last 14 days? No.  Do you have any family members or close contacts with diagnosed or suspected Covid-19 in the past 14 days? No.  Have you been tested for Covid-19 and found to be positive? No.       Follow up Call-  Call back number 06/12/2020  Post procedure Call Back phone  # [phone]  Permission to leave phone message Yes  Some recent data might be hidden     Patient questions:  Do you have a fever, pain , or abdominal swelling? No. Pain Score  0 *  Have you tolerated food without any problems? Yes.    Have you been able to return to your normal activities? Yes.    Do you have any questions about your discharge instructions: Diet   No. Medications  No. Follow up visit  No.  Do you have questions or concerns about your Care? No.  Actions: * If pain score is 4 or above: No action needed, pain <4.

## 2020-06-18 ENCOUNTER — Encounter: Payer: Self-pay | Admitting: Internal Medicine

## 2020-07-10 ENCOUNTER — Telehealth: Payer: Self-pay | Admitting: Nurse Practitioner

## 2020-07-10 NOTE — Telephone Encounter (Signed)
  REFERRAL REQUEST Telephone Note 07/10/2020  What type of referral do you need? [place]   Why do you need this referral? Retinitispigmintosis?  Have you been seen at our office for this problem? yes (Advise that they may need an appointment with their PCP before a referral can be done)  Is there a particular doctor or location that you prefer? [facility]  Patient notified that referrals can take up to a week or longer to process. If they haven't heard anything within a week they should call back and speak with the referral department.   [facility] Use Only - Patient would like an appointment in the Am around 9 or 10

## 2020-07-11 ENCOUNTER — Encounter: Payer: Self-pay | Admitting: Nurse Practitioner

## 2020-07-11 ENCOUNTER — Other Ambulatory Visit: Payer: Self-pay | Admitting: Nurse Practitioner

## 2020-07-11 DIAGNOSIS — H3552 Pigmentary retinal dystrophy: Secondary | ICD-10-CM

## 2020-07-11 NOTE — Telephone Encounter (Signed)
Referral made 

## 2020-07-11 NOTE — Progress Notes (Signed)
Ef

## 2020-07-12 ENCOUNTER — Telehealth: Payer: Self-pay | Admitting: Nurse Practitioner

## 2020-07-30 ENCOUNTER — Other Ambulatory Visit: Payer: Self-pay

## 2020-07-30 ENCOUNTER — Ambulatory Visit (INDEPENDENT_AMBULATORY_CARE_PROVIDER_SITE_OTHER): Payer: Medicare HMO | Admitting: Nurse Practitioner

## 2020-07-30 ENCOUNTER — Encounter: Payer: Self-pay | Admitting: Nurse Practitioner

## 2020-07-30 VITALS — BP 125/82 | HR 91 | Temp 98.2°F | Ht 68.0 in | Wt 172.0 lb

## 2020-07-30 DIAGNOSIS — Z6826 Body mass index (BMI) 26.0-26.9, adult: Secondary | ICD-10-CM

## 2020-07-30 DIAGNOSIS — E1169 Type 2 diabetes mellitus with other specified complication: Secondary | ICD-10-CM | POA: Diagnosis not present

## 2020-07-30 DIAGNOSIS — E785 Hyperlipidemia, unspecified: Secondary | ICD-10-CM | POA: Diagnosis not present

## 2020-07-30 DIAGNOSIS — E119 Type 2 diabetes mellitus without complications: Secondary | ICD-10-CM

## 2020-07-30 DIAGNOSIS — H3552 Pigmentary retinal dystrophy: Secondary | ICD-10-CM

## 2020-07-30 LAB — CMP14+EGFR
ALT: 20 IU/L (ref 0–44)
AST: 17 IU/L (ref 0–40)
Albumin/Globulin Ratio: 1.9 (ref 1.2–2.2)
Albumin: 4.7 g/dL (ref 3.8–4.9)
Alkaline Phosphatase: 53 IU/L (ref 48–121)
BUN/Creatinine Ratio: 12 (ref 9–20)
BUN: 11 mg/dL (ref 6–24)
Bilirubin Total: 0.5 mg/dL (ref 0.0–1.2)
CO2: 24 mmol/L (ref 20–29)
Calcium: 10.4 mg/dL — ABNORMAL HIGH (ref 8.7–10.2)
Chloride: 98 mmol/L (ref 96–106)
Creatinine, Ser: 0.93 mg/dL (ref 0.76–1.27)
GFR calc Af Amer: 104 mL/min/{1.73_m2} (ref >59)
GFR calc non Af Amer: 90 mL/min/{1.73_m2} (ref >59)
Globulin, Total: 2.5 g/dL (ref 1.5–4.5)
Glucose: 146 mg/dL — ABNORMAL HIGH (ref 65–99)
Potassium: 5.2 mmol/L (ref 3.5–5.2)
Sodium: 137 mmol/L (ref 134–144)
Total Protein: 7.2 g/dL (ref 6.0–8.5)

## 2020-07-30 LAB — LIPID PANEL
Chol/HDL Ratio: 4 ratio (ref 0.0–5.0)
Cholesterol, Total: 167 mg/dL (ref 100–199)
HDL: 42 mg/dL (ref >39)
LDL Chol Calc (NIH): 104 mg/dL — ABNORMAL HIGH (ref 0–99)
Triglycerides: 113 mg/dL (ref 0–149)
VLDL Cholesterol Cal: 21 mg/dL (ref 5–40)

## 2020-07-30 LAB — CBC WITH DIFFERENTIAL/PLATELET
Basophils Absolute: 0.1 10*3/uL (ref 0.0–0.2)
Basos: 1 %
EOS (ABSOLUTE): 0.1 10*3/uL (ref 0.0–0.4)
Eos: 1 %
Hematocrit: 46.7 % (ref 37.5–51.0)
Hemoglobin: 15.2 g/dL (ref 13.0–17.7)
Immature Grans (Abs): 0 10*3/uL (ref 0.0–0.1)
Immature Granulocytes: 0 %
Lymphocytes Absolute: 2.7 10*3/uL (ref 0.7–3.1)
Lymphs: 29 %
MCH: 29.2 pg (ref 26.6–33.0)
MCHC: 32.5 g/dL (ref 31.5–35.7)
MCV: 90 fL (ref 79–97)
Monocytes Absolute: 0.8 10*3/uL (ref 0.1–0.9)
Monocytes: 9 %
Neutrophils Absolute: 5.6 10*3/uL (ref 1.4–7.0)
Neutrophils: 60 %
Platelets: 296 10*3/uL (ref 150–450)
RBC: 5.21 x10E6/uL (ref 4.14–5.80)
RDW: 12.8 % (ref 11.6–15.4)
WBC: 9.4 10*3/uL (ref 3.4–10.8)

## 2020-07-30 LAB — BAYER DCA HB A1C WAIVED: HB A1C (BAYER DCA - WAIVED): 7.7 % — ABNORMAL HIGH (ref <7.0)

## 2020-07-30 MED ORDER — METFORMIN HCL 1000 MG PO TABS: 1000.0000 mg | ORAL_TABLET | Freq: Two times a day (BID) | ORAL | 1 refills | Status: DC

## 2020-07-30 MED ORDER — ROSUVASTATIN CALCIUM 20 MG PO TABS: 20.0000 mg | ORAL_TABLET | Freq: Every day | ORAL | 1 refills | Status: DC

## 2020-07-30 MED ORDER — SITAGLIPTIN PHOSPHATE 100 MG PO TABS: 100.0000 mg | ORAL_TABLET | Freq: Every day | ORAL | 1 refills | Status: DC

## 2020-07-30 NOTE — Patient Instructions (Signed)
Diabetes Mellitus and Foot Care Foot care is an important part of your health, especially when you have diabetes. Diabetes may cause you to have problems because of poor blood flow (circulation) to your feet and legs, which can cause your skin to:  Become thinner and drier.  Break more easily.  Heal more slowly.  Peel and crack. You may also have nerve damage (neuropathy) in your legs and feet, causing decreased feeling in them. This means that you may not notice minor injuries to your feet that could lead to more serious problems. Noticing and addressing any potential problems early is the best way to prevent future foot problems. How to care for your feet Foot hygiene  Wash your feet daily with warm water and mild soap. Do not use hot water. Then, pat your feet and the areas between your toes until they are completely dry. Do not soak your feet as this can dry your skin.  Trim your toenails straight across. Do not dig under them or around the cuticle. File the edges of your nails with an emery board or nail file.  Apply a moisturizing lotion or petroleum jelly to the skin on your feet and to dry, brittle toenails. Use lotion that does not contain alcohol and is unscented. Do not apply lotion between your toes. Shoes and socks  Wear clean socks or stockings every day. Make sure they are not too tight. Do not wear knee-high stockings since they may decrease blood flow to your legs.  Wear shoes that fit properly and have enough cushioning. Always look in your shoes before you put them on to be sure there are no objects inside.  To break in new shoes, wear them for just a few hours a day. This prevents injuries on your feet. Wounds, scrapes, corns, and calluses  Check your feet daily for blisters, cuts, bruises, sores, and redness. If you cannot see the bottom of your feet, use a mirror or ask someone for help.  Do not cut corns or calluses or try to remove them with medicine.  If you  find a minor scrape, cut, or break in the skin on your feet, keep it and the skin around it clean and dry. You may clean these areas with mild soap and water. Do not clean the area with peroxide, alcohol, or iodine.  If you have a wound, scrape, corn, or callus on your foot, look at it several times a day to make sure it is healing and not infected. Check for: ? Redness, swelling, or pain. ? Fluid or blood. ? Warmth. ? Pus or a bad smell. General instructions  Do not cross your legs. This may decrease blood flow to your feet.  Do not use heating pads or hot water bottles on your feet. They may burn your skin. If you have lost feeling in your feet or legs, you may not know this is happening until it is too late.  Protect your feet from hot and cold by wearing shoes, such as at the beach or on hot pavement.  Schedule a complete foot exam at least once a year (annually) or more often if you have foot problems. If you have foot problems, report any cuts, sores, or bruises to your health care provider immediately. Contact a health care provider if:  You have a medical condition that increases your risk of infection and you have any cuts, sores, or bruises on your feet.  You have an injury that is not   healing.  You have redness on your legs or feet.  You feel burning or tingling in your legs or feet.  You have pain or cramps in your legs and feet.  Your legs or feet are numb.  Your feet always feel cold.  You have pain around a toenail. Get help right away if:  You have a wound, scrape, corn, or callus on your foot and: ? You have pain, swelling, or redness that gets worse. ? You have fluid or blood coming from the wound, scrape, corn, or callus. ? Your wound, scrape, corn, or callus feels warm to the touch. ? You have pus or a bad smell coming from the wound, scrape, corn, or callus. ? You have a fever. ? You have a red line going up your leg. Summary  Check your feet every day  for cuts, sores, red spots, swelling, and blisters.  Moisturize feet and legs daily.  Wear shoes that fit properly and have enough cushioning.  If you have foot problems, report any cuts, sores, or bruises to your health care provider immediately.  Schedule a complete foot exam at least once a year (annually) or more often if you have foot problems. This information is not intended to replace advice given to you by your health care provider. Make sure you discuss any questions you have with your health care provider. Document Revised: 08/23/2019 Document Reviewed: 01/01/2017 Elsevier Patient Education  2020 Elsevier Inc.  

## 2020-07-30 NOTE — Progress Notes (Signed)
Subjective:    Patient ID: Patrick Valentine, male    DOB: Aug 04, 1961, 59 y.o.   MRN: [id number]   Chief Complaint: Medical Management of Chronic Issues    HPI:  1. Hyperlipidemia associated with type 2 diabetes mellitus ([place]) Has been watching diet somewhat. He started exercising again last week. Lab Results  Component Value Date   CHOL 138 04/23/2020   HDL 41 04/23/2020   LDLCALC 75 04/23/2020   TRIG 125 04/23/2020   CHOLHDL 3.4 04/23/2020   The 10-year ASCVD risk score Patrick Bussing DC Jr., et al., 2013) is: 11.6%   Values used to calculate the score:     Age: 34 years     Sex: Male     Is Non-Hispanic African American: No     Diabetic: Yes     Tobacco smoker: No     Systolic Blood Pressure: 009 mmHg     Is BP treated: No     HDL Cholesterol: 41 mg/dL     Total Cholesterol: 138 mg/dL   2. Diabetes mellitus without complication (HCC) Fasting blood sugars are running 180 and lower. Lab Results  Component Value Date   HGBA1C 7.2 (H) 04/23/2020     3. BMI 26.0-26.9,adult No recent weight changes Wt Readings from Last 3 Encounters:  07/30/20 172 lb (78 kg)  06/12/20 170 lb (77.1 kg)  05/29/20 170 lb (77.1 kg)   BMI Readings from Last 3 Encounters:  07/30/20 26.15 kg/m  06/12/20 25.85 kg/m  05/29/20 25.85 kg/m     4. Retinitis pigmentosa of both eyes Has follow up with specialist every 3-6 months. Says he has had no rcent changes.    Outpatient Encounter Medications as of 07/30/2020  Medication Sig  . ACCU-CHEK SOFTCLIX LANCETS lancets Test BID  . Alcohol Swabs (B-D SINGLE USE SWABS REGULAR) PADS Test BID  . Blood Glucose Calibration (ACCU-CHEK AVIVA) SOLN Use with glucose meter  . Blood Glucose Calibration (ACCU-CHEK SMARTVIEW CONTROL) LIQD Use with meters as directed  . Blood Glucose Monitoring Suppl (ACCU-CHEK AVIVA PLUS) w/Device KIT Test BID  . glucose blood (ACCU-CHEK AVIVA PLUS) test strip Test BID  . Lancet Devices (ONE TOUCH DELICA LANCING DEV) MISC  Use to check blood sugar daily  . Lancets Misc. (ACCU-CHEK FASTCLIX LANCET) KIT Test BID  . metFORMIN (GLUCOPHAGE) 1000 MG tablet Take 1 tablet (1,000 mg total) by mouth 2 (two) times daily with a meal.  . rosuvastatin (CRESTOR) 20 MG tablet Take 1 tablet (20 mg total) by mouth daily.  . sitaGLIPtin (JANUVIA) 100 MG tablet Take 1 tablet (100 mg total) by mouth daily.     Past Surgical History:  Procedure Laterality Date  . NO PAST SURGERIES      Family History  Problem Relation Age of Onset  . Diabetes Father 30       IDDM  . Diabetes Sister 30       IDDM  . Colon cancer Neg Hx   . Esophageal cancer Neg Hx   . Stomach cancer Neg Hx   . Rectal cancer Neg Hx     New complaints: None today  Social history: Lives with wife- they ar both retired  Controlled substance contract: n/a    Review of Systems  Constitutional: Negative for diaphoresis.  Eyes: Negative for pain.  Respiratory: Negative for shortness of breath.   Cardiovascular: Negative for chest pain, palpitations and leg swelling.  Gastrointestinal: Negative for abdominal pain.  Endocrine: Negative for polydipsia.  Skin: Negative for  rash.  Neurological: Negative for dizziness, weakness and headaches.  Hematological: Does not bruise/bleed easily.  All other systems reviewed and are negative.      Objective:   Physical Exam Vitals and nursing note reviewed.  Constitutional:      Appearance: Normal appearance. He is well-developed.  HENT:     Head: Normocephalic.     Nose: Nose normal.  Eyes:     Pupils: Pupils are equal, round, and reactive to light.  Neck:     Thyroid: No thyroid mass or thyromegaly.     Vascular: No carotid bruit or JVD.     Trachea: Phonation normal.  Cardiovascular:     Rate and Rhythm: Normal rate and regular rhythm.  Pulmonary:     Effort: Pulmonary effort is normal. No respiratory distress.     Breath sounds: Normal breath sounds.  Abdominal:     General: Bowel sounds are  normal.     Palpations: Abdomen is soft.     Tenderness: There is no abdominal tenderness.  Musculoskeletal:        General: Normal range of motion.     Cervical back: Normal range of motion and neck supple.  Lymphadenopathy:     Cervical: No cervical adenopathy.  Skin:    General: Skin is warm and dry.  Neurological:     Mental Status: He is alert and oriented to person, place, and time.  Psychiatric:        Behavior: Behavior normal.        Thought Content: Thought content normal.        Judgment: Judgment normal.     BP 125/82   Pulse 91   Temp 98.2 F (36.8 C) (Temporal)   Ht 5' 8"  (1.727 m)   Wt 172 lb (78 kg)   SpO2 98%   BMI 26.15 kg/m   hgba1c-7.7     Assessment & Plan:  Patrick Valentine comes in today with chief complaint of Medical Management of Chronic Issues   Diagnosis and orders addressed:  1. Hyperlipidemia associated with type 2 diabetes mellitus (HCC) Low fat diet - Lipid panel - rosuvastatin (CRESTOR) 20 MG tablet; Take 1 tablet (20 mg total) by mouth daily.  Dispense: 90 tablet; Refill: 1  2. Diabetes mellitus without complication ([place]) Continue to watch carbs in diet - Bayer DCA Hb A1c Waived - CBC with Differential/Platelet - CMP14+EGFR - sitaGLIPtin (JANUVIA) 100 MG tablet; Take 1 tablet (100 mg total) by mouth daily.  Dispense: 90 tablet; Refill: 1 - metFORMIN (GLUCOPHAGE) 1000 MG tablet; Take 1 tablet (1,000 mg total) by mouth 2 (two) times daily with a meal.  Dispense: 180 tablet; Refill: 1   3. BMI 26.0-26.9,adult Discussed diet and exercise for person with BMI >25 Will recheck weight in 3-6 months  4. Retinitis pigmentosa of both eyes Keep follow up with specialist   Labs pending Health Maintenance reviewed Diet and exercise encouraged  Follow up plan: 3 months   [place], FNP

## 2020-07-31 ENCOUNTER — Other Ambulatory Visit: Payer: Self-pay

## 2020-07-31 MED ORDER — TRUE METRIX PRO BLOOD GLUCOSE VI STRP: ORAL_STRIP | 12 refills | Status: DC

## 2020-07-31 MED ORDER — TRUEPLUS LANCETS 33G MISC: 6 refills | Status: DC

## 2020-07-31 MED ORDER — TRUE METRIX LEVEL 2 NORMAL VI SOLN: 6 refills | Status: DC

## 2020-07-31 MED ORDER — TRUE METRIX METER W/DEVICE KIT: PACK | 0 refills | Status: DC

## 2020-10-08 ENCOUNTER — Ambulatory Visit (INDEPENDENT_AMBULATORY_CARE_PROVIDER_SITE_OTHER): Payer: Medicare HMO

## 2020-10-08 ENCOUNTER — Other Ambulatory Visit: Payer: Self-pay

## 2020-10-08 DIAGNOSIS — Z23 Encounter for immunization: Secondary | ICD-10-CM

## 2020-11-05 ENCOUNTER — Other Ambulatory Visit: Payer: Self-pay

## 2020-11-05 ENCOUNTER — Encounter: Payer: Self-pay | Admitting: Nurse Practitioner

## 2020-11-05 ENCOUNTER — Ambulatory Visit (INDEPENDENT_AMBULATORY_CARE_PROVIDER_SITE_OTHER): Payer: Medicare HMO | Admitting: Nurse Practitioner

## 2020-11-05 VITALS — BP 138/84 | HR 95 | Temp 98.0°F | Resp 20 | Ht 68.0 in | Wt 168.0 lb

## 2020-11-05 DIAGNOSIS — E119 Type 2 diabetes mellitus without complications: Secondary | ICD-10-CM

## 2020-11-05 DIAGNOSIS — E1169 Type 2 diabetes mellitus with other specified complication: Secondary | ICD-10-CM

## 2020-11-05 DIAGNOSIS — Z6826 Body mass index (BMI) 26.0-26.9, adult: Secondary | ICD-10-CM

## 2020-11-05 DIAGNOSIS — E785 Hyperlipidemia, unspecified: Secondary | ICD-10-CM

## 2020-11-05 DIAGNOSIS — H3552 Pigmentary retinal dystrophy: Secondary | ICD-10-CM | POA: Diagnosis not present

## 2020-11-05 LAB — LIPID PANEL
Chol/HDL Ratio: 3.6 ratio (ref 0.0–5.0)
Cholesterol, Total: 139 mg/dL (ref 100–199)
HDL: 39 mg/dL — ABNORMAL LOW (ref >39)
LDL Chol Calc (NIH): 79 mg/dL (ref 0–99)
Triglycerides: 117 mg/dL (ref 0–149)
VLDL Cholesterol Cal: 21 mg/dL (ref 5–40)

## 2020-11-05 LAB — CMP14+EGFR
ALT: 20 IU/L (ref 0–44)
AST: 9 IU/L (ref 0–40)
Albumin/Globulin Ratio: 1.8 (ref 1.2–2.2)
Albumin: 4.4 g/dL (ref 3.8–4.9)
Alkaline Phosphatase: 58 IU/L (ref 44–121)
BUN/Creatinine Ratio: 9 — ABNORMAL LOW (ref 10–24)
BUN: 8 mg/dL (ref 8–27)
Bilirubin Total: 0.6 mg/dL (ref 0.0–1.2)
CO2: 24 mmol/L (ref 20–29)
Calcium: 9.9 mg/dL (ref 8.6–10.2)
Chloride: 94 mmol/L — ABNORMAL LOW (ref 96–106)
Creatinine, Ser: 0.89 mg/dL (ref 0.76–1.27)
GFR calc Af Amer: 107 mL/min/{1.73_m2} (ref >59)
GFR calc non Af Amer: 93 mL/min/{1.73_m2} (ref >59)
Globulin, Total: 2.4 g/dL (ref 1.5–4.5)
Glucose: 237 mg/dL — ABNORMAL HIGH (ref 65–99)
Potassium: 4.6 mmol/L (ref 3.5–5.2)
Sodium: 134 mmol/L (ref 134–144)
Total Protein: 6.8 g/dL (ref 6.0–8.5)

## 2020-11-05 LAB — CBC WITH DIFFERENTIAL/PLATELET
Basophils Absolute: 0.1 10*3/uL (ref 0.0–0.2)
Basos: 1 %
EOS (ABSOLUTE): 0.2 10*3/uL (ref 0.0–0.4)
Eos: 2 %
Hematocrit: 45.3 % (ref 37.5–51.0)
Hemoglobin: 15.9 g/dL (ref 13.0–17.7)
Immature Grans (Abs): 0 10*3/uL (ref 0.0–0.1)
Immature Granulocytes: 0 %
Lymphocytes Absolute: 3.1 10*3/uL (ref 0.7–3.1)
Lymphs: 37 %
MCH: 31.7 pg (ref 26.6–33.0)
MCHC: 35.1 g/dL (ref 31.5–35.7)
MCV: 90 fL (ref 79–97)
Monocytes Absolute: 0.7 10*3/uL (ref 0.1–0.9)
Monocytes: 9 %
Neutrophils Absolute: 4.2 10*3/uL (ref 1.4–7.0)
Neutrophils: 51 %
Platelets: 277 10*3/uL (ref 150–450)
RBC: 5.02 x10E6/uL (ref 4.14–5.80)
RDW: 12.8 % (ref 11.6–15.4)
WBC: 8.3 10*3/uL (ref 3.4–10.8)

## 2020-11-05 LAB — BAYER DCA HB A1C WAIVED: HB A1C (BAYER DCA - WAIVED): 8.2 % — ABNORMAL HIGH (ref <7.0)

## 2020-11-05 MED ORDER — DAPAGLIFLOZIN PROPANEDIOL 10 MG PO TABS: 10.0000 mg | ORAL_TABLET | Freq: Every day | ORAL | 5 refills | Status: DC

## 2020-11-05 MED ORDER — SITAGLIPTIN PHOSPHATE 100 MG PO TABS: 100.0000 mg | ORAL_TABLET | Freq: Every day | ORAL | 1 refills | Status: DC

## 2020-11-05 MED ORDER — ROSUVASTATIN CALCIUM 20 MG PO TABS: 20.0000 mg | ORAL_TABLET | Freq: Every day | ORAL | 1 refills | Status: DC

## 2020-11-05 MED ORDER — FREESTYLE LIBRE 14 DAY SENSOR MISC: 1.0000 | 6 refills | Status: DC

## 2020-11-05 MED ORDER — FREESTYLE LIBRE 2 READER DEVI: 1.0000 | Freq: Every day | 0 refills | Status: DC

## 2020-11-05 MED ORDER — METFORMIN HCL 1000 MG PO TABS: 1000.0000 mg | ORAL_TABLET | Freq: Two times a day (BID) | ORAL | 1 refills | Status: DC

## 2020-11-05 NOTE — Progress Notes (Addendum)
Subjective:    Patient ID: Patrick Valentine, male    DOB: [birth year], 59 y.o.   MRN: [id number]   Chief Complaint: No chief complaint on file.    HPI:  1. Diabetes mellitus without complication ([place]) Checks blood sugars at home but not for the last week. Ranges around 160s. Would like to see about doing Freestyle Libre 2 or something where he doesn't have to poke himself all the time. Is also interested in seeing if there is something else he could take besides metformin because it gives him diarrhea and makes him tired. Denies blurry vision, numbness/tingling in feet. Denies hypoglycemia. Is sometimes watching carbohydrates in diet but not always but does try to eat in moderation. Lab Results  Component Value Date   HGBA1C 7.7 (H) 07/30/2020    2. Hyperlipidemia associated with type 2 diabetes mellitus ([place]) Takes crestor and is doing well with that. Has decreased meat and fatty food intake.  Lab Results  Component Value Date   CHOL 167 07/30/2020   HDL 42 07/30/2020   LDLCALC 104 (H) 07/30/2020   TRIG 113 07/30/2020   CHOLHDL 4.0 07/30/2020    3. BMI 26.0-26.9,adult No significant changes in weight. Has lost 4 pounds since last visit. Wt Readings from Last 3 Encounters:  07/30/20 172 lb (78 kg)  06/12/20 170 lb (77.1 kg)  05/29/20 170 lb (77.1 kg)   BMI Readings from Last 3 Encounters:  07/30/20 26.15 kg/m  06/12/20 25.85 kg/m  05/29/20 25.85 kg/m    4. Retinitis pigmentosa of both eyes Sees specialist every 3-6 months. Going to [facility] 06/11/21 hopefully for stem cell treatment.    Outpatient Encounter Medications as of 11/05/2020  Medication Sig  . ACCU-CHEK SOFTCLIX LANCETS lancets Test BID  . Alcohol Swabs (B-D SINGLE USE SWABS REGULAR) PADS Test BID  . Blood Glucose Calibration (ACCU-CHEK AVIVA) SOLN Use with glucose meter  . Blood Glucose Calibration (ACCU-CHEK SMARTVIEW CONTROL) LIQD Use with meters as directed  . Blood Glucose Calibration (TRUE METRIX  LEVEL 2) Normal SOLN Use to calibrate monitor  . Blood Glucose Monitoring Suppl (ACCU-CHEK AVIVA PLUS) w/Device KIT Test BID  . Blood Glucose Monitoring Suppl (TRUE METRIX METER) w/Device KIT Use to check blood sugars twice daily and prn. Dx Code E11.9  . glucose blood (ACCU-CHEK AVIVA PLUS) test strip Test BID  . glucose blood (TRUE METRIX PRO BLOOD GLUCOSE) test strip Use to check blood sugar twice daily and prn Dx E11.9  . Lancet Devices (ONE TOUCH DELICA LANCING DEV) MISC Use to check blood sugar daily  . Lancets Misc. (ACCU-CHEK FASTCLIX LANCET) KIT Test BID  . metFORMIN (GLUCOPHAGE) 1000 MG tablet Take 1 tablet (1,000 mg total) by mouth 2 (two) times daily with a meal.  . rosuvastatin (CRESTOR) 20 MG tablet Take 1 tablet (20 mg total) by mouth daily.  . sitaGLIPtin (JANUVIA) 100 MG tablet Take 1 tablet (100 mg total) by mouth daily.  . TRUEplus Lancets 33G MISC Use to check blood sugar twice daily and prn. Dx Code E11.9   No facility-administered encounter medications on file as of 11/05/2020.    Past Surgical History:  Procedure Laterality Date  . NO PAST SURGERIES      Family History  Problem Relation Age of Onset  . Diabetes Father 30       IDDM  . Diabetes Sister 30       IDDM  . Colon cancer Neg Hx   . Esophageal cancer Neg Hx   .  Stomach cancer Neg Hx   . Rectal cancer Neg Hx     New complaints: None.  Social history: Lives with wife and they are both retired.  Controlled substance contract: N/A   Review of Systems  Constitutional: Negative.   HENT: Positive for hearing loss (wears hearing aids).   Eyes: Negative.   Respiratory: Negative.   Cardiovascular: Negative.   Gastrointestinal: Negative.   Genitourinary: Negative.   Musculoskeletal: Negative.   Skin: Negative.   Neurological: Negative.   Psychiatric/Behavioral: Negative.        Objective:   Physical Exam Vitals and nursing note reviewed.  Constitutional:      Appearance: Normal  appearance.  HENT:     Head: Normocephalic.     Right Ear: Tympanic membrane normal.     Left Ear: Tympanic membrane normal.     Nose: Nose normal.     Mouth/Throat:     Mouth: Mucous membranes are moist.     Pharynx: Oropharynx is clear.  Eyes:     Conjunctiva/sclera: Conjunctivae normal.     Pupils: Pupils are equal, round, and reactive to light.  Cardiovascular:     Rate and Rhythm: Normal rate and regular rhythm.     Pulses: Normal pulses.     Heart sounds: Normal heart sounds.  Pulmonary:     Effort: Pulmonary effort is normal.     Breath sounds: Normal breath sounds.  Abdominal:     General: Bowel sounds are normal.     Palpations: Abdomen is soft.  Musculoskeletal:        General: Normal range of motion.     Cervical back: Normal range of motion.  Skin:    General: Skin is warm and dry.     Capillary Refill: Capillary refill takes less than 2 seconds.  Neurological:     General: No focal deficit present.     Mental Status: He is alert and oriented to person, place, and time.  Psychiatric:        Mood and Affect: Mood normal.        Behavior: Behavior normal.     BP 138/84   Pulse 95   Temp 98 F (36.7 C) (Temporal)   Resp 20   Ht 5' 8"  (1.727 m)   Wt 168 lb (76.2 kg)   SpO2 98%   BMI 25.54 kg/m      Assessment & Plan:  Charle Clear comes in today with chief complaint of Medical Management of Chronic Issues   Diagnosis and orders addressed:  1. Diabetes mellitus without complication ([place]) Watch carbs in diet Must stay on metformin Stop januvia due to cost farxiga 30m daily- 30 day coupon given Will make appointment with clinical pharmacist - Bayer DCA Hb A1c Waived - Microalbumin / creatinine urine ratio -patient is testing blood sugar 4-6 times daily -we are making adjustments to medications based on blood sugar readings -patient would benefit from CGM system (I.e. Libre or Dexcom)   2. Hyperlipidemia associated with type 2 diabetes  mellitus (HCC) Low fat diet - Lipid panel  3. BMI 26.0-26.9,adult Discussed diet and exercise for person with BMI >25 Will recheck weight in 3-6 months - CBC with Differential/Platelet - CMP14+EGFR  4. Retinitis pigmentosa of both eyes Keep follow up with [facility] in June  Meds ordered this encounter  Medications  . rosuvastatin (CRESTOR) 20 MG tablet    Sig: Take 1 tablet (20 mg total) by mouth daily.    Dispense:  90 tablet  Refill:  1    Order Specific Question:   Supervising Provider    Answer:   Caryl Pina A [id number]  . DISCONTD: sitaGLIPtin (JANUVIA) 100 MG tablet    Sig: Take 1 tablet (100 mg total) by mouth daily.    Dispense:  90 tablet    Refill:  1    Order Specific Question:   Supervising Provider    Answer:   Caryl Pina A [id number]  . metFORMIN (GLUCOPHAGE) 1000 MG tablet    Sig: Take 1 tablet (1,000 mg total) by mouth 2 (two) times daily with a meal.    Dispense:  180 tablet    Refill:  1    Order Specific Question:   Supervising Provider    Answer:   Caryl Pina A [[id number]]  . Continuous Blood Gluc Receiver (FREESTYLE LIBRE 2 READER) DEVI    Sig: 1 each by Does not apply route daily.    Dispense:  1 each    Refill:  0    Order Specific Question:   Supervising Provider    Answer:   Caryl Pina A [id number]  . Continuous Blood Gluc Sensor (FREESTYLE LIBRE 14 DAY SENSOR) MISC    Sig: 1 each by Does not apply route every 14 (fourteen) days.    Dispense:  2 each    Refill:  6    Order Specific Question:   Supervising Provider    Answer:   Caryl Pina A [id number]  . dapagliflozin propanediol (FARXIGA) 10 MG TABS tablet    Sig: Take 1 tablet (10 mg total) by mouth daily before breakfast.    Dispense:  30 tablet    Refill:  5    Order Specific Question:   Supervising Provider    Answer:   Caryl Pina A [id number]    Labs pending Health Maintenance reviewed Diet and exercise encouraged  Follow up plan: 3  months   [place], FNP

## 2020-11-05 NOTE — Patient Instructions (Signed)
Carbohydrate Counting for Diabetes Mellitus, Adult  Carbohydrate counting is a method of keeping track of how many carbohydrates you eat. Eating carbohydrates naturally increases the amount of sugar (glucose) in the blood. Counting how many carbohydrates you eat helps keep your blood glucose within normal limits, which helps you manage your diabetes (diabetes mellitus). It is important to know how many carbohydrates you can safely have in each meal. This is different for every person. A diet and nutrition specialist (registered dietitian) can help you make a meal plan and calculate how many carbohydrates you should have at each meal and snack. Carbohydrates are found in the following foods:  Grains, such as breads and cereals.  Dried beans and soy products.  Starchy vegetables, such as potatoes, peas, and corn.  Fruit and fruit juices.  Milk and yogurt.  Sweets and snack foods, such as cake, cookies, candy, chips, and soft drinks. How do I count carbohydrates? There are two ways to count carbohydrates in food. You can use either of the methods or a combination of both. Reading "Nutrition Facts" on packaged food The "Nutrition Facts" list is included on the labels of almost all packaged foods and beverages in the [place] It includes:  The serving size.  Information about nutrients in each serving, including the grams (g) of carbohydrate per serving. To use the "Nutrition Facts":  Decide how many servings you will have.  Multiply the number of servings by the number of carbohydrates per serving.  The resulting number is the total amount of carbohydrates that you will be having. Learning standard serving sizes of other foods When you eat carbohydrate foods that are not packaged or do not include "Nutrition Facts" on the label, you need to measure the servings in order to count the amount of carbohydrates:  Measure the foods that you will eat with a food scale or measuring cup, if  needed.  Decide how many standard-size servings you will eat.  Multiply the number of servings by 15. Most carbohydrate-rich foods have about 15 g of carbohydrates per serving. ? For example, if you eat 8 oz (170 g) of strawberries, you will have eaten 2 servings and 30 g of carbohydrates (2 servings x 15 g = 30 g).  For foods that have more than one food mixed, such as soups and casseroles, you must count the carbohydrates in each food that is included. The following list contains standard serving sizes of common carbohydrate-rich foods. Each of these servings has about 15 g of carbohydrates:   hamburger bun or  English muffin.   oz (15 mL) syrup.   oz (14 g) jelly.  1 slice of bread.  1 six-inch tortilla.  3 oz (85 g) cooked rice or pasta.  4 oz (113 g) cooked dried beans.  4 oz (113 g) starchy vegetable, such as peas, corn, or potatoes.  4 oz (113 g) hot cereal.  4 oz (113 g) mashed potatoes or  of a large baked potato.  4 oz (113 g) canned or frozen fruit.  4 oz (120 mL) fruit juice.  4-6 crackers.  6 chicken nuggets.  6 oz (170 g) unsweetened dry cereal.  6 oz (170 g) plain fat-free yogurt or yogurt sweetened with artificial sweeteners.  8 oz (240 mL) milk.  8 oz (170 g) fresh fruit or one small piece of fruit.  24 oz (680 g) popped popcorn. Example of carbohydrate counting Sample meal  3 oz (85 g) chicken breast.  6 oz (170 g)   brown rice.  4 oz (113 g) corn.  8 oz (240 mL) milk.  8 oz (170 g) strawberries with sugar-free whipped topping. Carbohydrate calculation 1. Identify the foods that contain carbohydrates: ? Rice. ? Corn. ? Milk. ? Strawberries. 2. Calculate how many servings you have of each food: ? 2 servings rice. ? 1 serving corn. ? 1 serving milk. ? 1 serving strawberries. 3. Multiply each number of servings by 15 g: ? 2 servings rice x 15 g = 30 g. ? 1 serving corn x 15 g = 15 g. ? 1 serving milk x 15 g = 15 g. ? 1  serving strawberries x 15 g = 15 g. 4. Add together all of the amounts to find the total grams of carbohydrates eaten: ? 30 g + 15 g + 15 g + 15 g = 75 g of carbohydrates total. Summary  Carbohydrate counting is a method of keeping track of how many carbohydrates you eat.  Eating carbohydrates naturally increases the amount of sugar (glucose) in the blood.  Counting how many carbohydrates you eat helps keep your blood glucose within normal limits, which helps you manage your diabetes.  A diet and nutrition specialist (registered dietitian) can help you make a meal plan and calculate how many carbohydrates you should have at each meal and snack. This information is not intended to replace advice given to you by your health care provider. Make sure you discuss any questions you have with your health care provider. Document Revised: 06/24/2017 Document Reviewed: 05/13/2016 Elsevier Patient Education  2020 Elsevier Inc.  

## 2020-11-12 DIAGNOSIS — R972 Elevated prostate specific antigen [PSA]: Secondary | ICD-10-CM | POA: Diagnosis not present

## 2020-11-19 ENCOUNTER — Other Ambulatory Visit: Payer: Self-pay

## 2020-11-19 ENCOUNTER — Ambulatory Visit (INDEPENDENT_AMBULATORY_CARE_PROVIDER_SITE_OTHER): Payer: Medicare HMO | Admitting: Pharmacist

## 2020-11-19 DIAGNOSIS — E119 Type 2 diabetes mellitus without complications: Secondary | ICD-10-CM

## 2020-11-19 DIAGNOSIS — R972 Elevated prostate specific antigen [PSA]: Secondary | ICD-10-CM | POA: Diagnosis not present

## 2020-11-19 NOTE — Progress Notes (Signed)
    11/19/2020 Name: Patrick Valentine MRN: [id number] DOB: 07-Aug-1961   S:  33 yoM Presents for diabetes evaluation, education, and management Patient was referred and last seen by Primary Care Provider on 11/05/20.  Insurance coverage/medication affordability: humana  Patient reports adherence with medications. . Current diabetes medications include: farxiga, metformin . Current hypertension medications include: n/a Goal 130/80 . Current hyperlipidemia medications include: crestor   Patient denies hypoglycemic events.   The patient is asked to make an attempt to improve diet and exercise patterns to aid in medical management of this problem.  Patient-reported exercise habits: n/a  Patient reports nocturia (nighttime urination).  O:  Lab Results  Component Value Date   HGBA1C 8.2 (H) 11/05/2020    Lipid Panel     Component Value Date/Time   CHOL 139 11/05/2020 0852   TRIG 117 11/05/2020 0852   TRIG 155 (H) 05/03/2015 1034   HDL 39 (L) 11/05/2020 0852   HDL 47 05/03/2015 1034   CHOLHDL 3.6 11/05/2020 0852   LDLCALC 79 11/05/2020 0852   LDLCALC 262 (H) 07/20/2014 1022    Home fasting blood sugars: 160 was 180-190  2 hour post-meal/random blood sugars: n/a.   A/P:  Diabetes T2DM currently uncontrolled.  Patient is adherent with medication. Control is suboptimal due to diet/lifestyle/need for medication optimization.  -NEED TO APPLY FOR LIBRE CGM VIA [employer]; paperwork prepared and sent phone #[phone] (given to patient to call)  -Patient assistance application prepared and sent to [place]   Medication will ship to patient's home RX escribed to [facility] with 3 refills  -Continue metformin and farxiga  -patient not on ACEi/ARB-will address at future visit and discuss with PCP  -Extensively discussed pathophysiology of diabetes, recommended lifestyle interventions, dietary effects on blood sugar control  -Counseled on s/sx of and  management of hypoglycemia  -Next A1C anticipated 3 months.   Written patient instructions provided.  Total time in face to face counseling 30 minutes.   Follow up PCP Clinic Visit in 3 months.     Regina Eck, PharmD, BCPS Clinical Pharmacist, [place]  II Phone [phone]

## 2020-11-20 ENCOUNTER — Encounter: Payer: Self-pay | Admitting: Pharmacist

## 2020-11-20 MED ORDER — DAPAGLIFLOZIN PROPANEDIOL 10 MG PO TABS: 10.0000 mg | ORAL_TABLET | Freq: Every day | ORAL | 4 refills | Status: DC

## 2020-11-22 ENCOUNTER — Telehealth: Payer: Self-pay

## 2020-11-29 ENCOUNTER — Telehealth (INDEPENDENT_AMBULATORY_CARE_PROVIDER_SITE_OTHER): Payer: Medicare HMO | Admitting: Nurse Practitioner

## 2020-11-29 DIAGNOSIS — E119 Type 2 diabetes mellitus without complications: Secondary | ICD-10-CM

## 2020-11-29 NOTE — Telephone Encounter (Signed)
Pt would like to discuss his prescription

## 2020-12-03 NOTE — Telephone Encounter (Signed)
Returned call to patient --discussed Patrick Valentine system via [facility] Patient reports $120 for 90 day system  Will check on Farxiga application for [id number]

## 2020-12-11 DIAGNOSIS — E1165 Type 2 diabetes mellitus with hyperglycemia: Secondary | ICD-10-CM | POA: Diagnosis not present

## 2020-12-25 ENCOUNTER — Telehealth: Payer: Self-pay

## 2020-12-25 NOTE — Telephone Encounter (Signed)
At this time there are no samples. Patient aware and verbalized understanding.

## 2021-01-01 NOTE — Telephone Encounter (Signed)
Pt has been out of [place] for a week. Do we have samples now? Please call back

## 2021-01-01 NOTE — Telephone Encounter (Signed)
Patient aware.

## 2021-01-01 NOTE — Telephone Encounter (Signed)
No samples available 

## 2021-02-05 ENCOUNTER — Other Ambulatory Visit: Payer: Self-pay

## 2021-02-05 ENCOUNTER — Encounter: Payer: Self-pay | Admitting: Nurse Practitioner

## 2021-02-05 ENCOUNTER — Ambulatory Visit (INDEPENDENT_AMBULATORY_CARE_PROVIDER_SITE_OTHER): Payer: Medicare HMO | Admitting: Nurse Practitioner

## 2021-02-05 VITALS — BP 125/86 | HR 96 | Temp 97.8°F | Resp 20 | Ht 68.0 in | Wt 166.0 lb

## 2021-02-05 DIAGNOSIS — Z6826 Body mass index (BMI) 26.0-26.9, adult: Secondary | ICD-10-CM

## 2021-02-05 DIAGNOSIS — E119 Type 2 diabetes mellitus without complications: Secondary | ICD-10-CM

## 2021-02-05 DIAGNOSIS — E1169 Type 2 diabetes mellitus with other specified complication: Secondary | ICD-10-CM | POA: Diagnosis not present

## 2021-02-05 DIAGNOSIS — E785 Hyperlipidemia, unspecified: Secondary | ICD-10-CM

## 2021-02-05 DIAGNOSIS — H3552 Pigmentary retinal dystrophy: Secondary | ICD-10-CM | POA: Diagnosis not present

## 2021-02-05 LAB — CMP14+EGFR
ALT: 17 IU/L (ref 0–44)
AST: 16 IU/L (ref 0–40)
Albumin/Globulin Ratio: 1.9 (ref 1.2–2.2)
Albumin: 4.5 g/dL (ref 3.8–4.9)
Alkaline Phosphatase: 58 IU/L (ref 44–121)
BUN/Creatinine Ratio: 10 (ref 10–24)
BUN: 10 mg/dL (ref 8–27)
Bilirubin Total: 0.5 mg/dL (ref 0.0–1.2)
CO2: 22 mmol/L (ref 20–29)
Calcium: 10.1 mg/dL (ref 8.6–10.2)
Chloride: 97 mmol/L (ref 96–106)
Creatinine, Ser: 0.96 mg/dL (ref 0.76–1.27)
GFR calc Af Amer: 99 mL/min/{1.73_m2} (ref >59)
GFR calc non Af Amer: 86 mL/min/{1.73_m2} (ref >59)
Globulin, Total: 2.4 g/dL (ref 1.5–4.5)
Glucose: 205 mg/dL — ABNORMAL HIGH (ref 65–99)
Potassium: 5.9 mmol/L (ref 3.5–5.2)
Sodium: 135 mmol/L (ref 134–144)
Total Protein: 6.9 g/dL (ref 6.0–8.5)

## 2021-02-05 LAB — CBC WITH DIFFERENTIAL/PLATELET
Basophils Absolute: 0.1 10*3/uL (ref 0.0–0.2)
Basos: 1 %
EOS (ABSOLUTE): 0.1 10*3/uL (ref 0.0–0.4)
Eos: 1 %
Hematocrit: 46 % (ref 37.5–51.0)
Hemoglobin: 15.7 g/dL (ref 13.0–17.7)
Immature Grans (Abs): 0 10*3/uL (ref 0.0–0.1)
Immature Granulocytes: 0 %
Lymphocytes Absolute: 2.2 10*3/uL (ref 0.7–3.1)
Lymphs: 24 %
MCH: 31.1 pg (ref 26.6–33.0)
MCHC: 34.1 g/dL (ref 31.5–35.7)
MCV: 91 fL (ref 79–97)
Monocytes Absolute: 0.7 10*3/uL (ref 0.1–0.9)
Monocytes: 8 %
Neutrophils Absolute: 5.9 10*3/uL (ref 1.4–7.0)
Neutrophils: 66 %
Platelets: 295 10*3/uL (ref 150–450)
RBC: 5.05 x10E6/uL (ref 4.14–5.80)
RDW: 13 % (ref 11.6–15.4)
WBC: 8.9 10*3/uL (ref 3.4–10.8)

## 2021-02-05 LAB — LIPID PANEL
Chol/HDL Ratio: 4.2 ratio (ref 0.0–5.0)
Cholesterol, Total: 154 mg/dL (ref 100–199)
HDL: 37 mg/dL — ABNORMAL LOW (ref >39)
LDL Chol Calc (NIH): 93 mg/dL (ref 0–99)
Triglycerides: 133 mg/dL (ref 0–149)
VLDL Cholesterol Cal: 24 mg/dL (ref 5–40)

## 2021-02-05 LAB — BAYER DCA HB A1C WAIVED: HB A1C (BAYER DCA - WAIVED): 8 % — ABNORMAL HIGH (ref <7.0)

## 2021-02-05 MED ORDER — ROSUVASTATIN CALCIUM 20 MG PO TABS: 20.0000 mg | ORAL_TABLET | Freq: Every day | ORAL | 1 refills | Status: DC

## 2021-02-05 MED ORDER — METFORMIN HCL 1000 MG PO TABS: 1000.0000 mg | ORAL_TABLET | Freq: Two times a day (BID) | ORAL | 1 refills | Status: DC

## 2021-02-05 NOTE — Progress Notes (Signed)
Subjective:    Patient ID: Patrick Valentine, male    DOB: [birth year], 60 y.o.   MRN: [id number]  Chief Complaint: medical management of chronic issues     HPI:  1. Hyperlipidemia associated with type 2 diabetes mellitus ([place]) Does try to watch diet. Has started exercising again. Walking on tread mill and has some type of gym that he does weights with. Lab Results  Component Value Date   CHOL 139 11/05/2020   HDL 39 (L) 11/05/2020   LDLCALC 79 11/05/2020   TRIG 117 11/05/2020   CHOLHDL 3.6 11/05/2020   The 10-year ASCVD risk score Patrick Valentine., et al., 2013) is: 13.3%   2. Diabetes mellitus without complication (HCC) fasting blood sugars have been up and down. He has the [place] and he checks it more then he use to. Range from 80-180's. Has had a few over 200. We put him on farxiga. Was ordered through patient assistants but has not gotten yet , so he has not been taking Lab Results  Component Value Date   HGBA1C 8.2 (H) 11/05/2020     3. Retinitis pigmentosa of both eyes Vision is unchanged. He has follow up appointment in June  4. BMI 26.0-26.9,adult No recent weight changes  Wt Readings from Last 3 Encounters:  02/05/21 166 lb (75.3 kg)  11/05/20 168 lb (76.2 kg)  07/30/20 172 lb (78 kg)   BMI Readings from Last 3 Encounters:  02/05/21 25.24 kg/m  11/05/20 25.54 kg/m  07/30/20 26.15 kg/m     Outpatient Encounter Medications as of 02/05/2021  Medication Sig  . ACCU-CHEK SOFTCLIX LANCETS lancets Test BID  . Alcohol Swabs (B-D SINGLE USE SWABS REGULAR) PADS Test BID  . Blood Glucose Calibration (ACCU-CHEK AVIVA) SOLN Use with glucose meter  . Blood Glucose Monitoring Suppl (ACCU-CHEK AVIVA PLUS) w/Device KIT Test BID  . Continuous Blood Gluc Receiver (FREESTYLE LIBRE 2 READER) DEVI 1 each by Does not apply route daily.  . Continuous Blood Gluc Sensor (FREESTYLE LIBRE 14 DAY SENSOR) MISC 1 each by Does not apply route every 14 (fourteen) days.  . dapagliflozin  propanediol (FARXIGA) 10 MG TABS tablet Take 1 tablet (10 mg total) by mouth daily before breakfast.  . glucose blood (ACCU-CHEK AVIVA PLUS) test strip Test BID  . Lancet Devices (ONE TOUCH DELICA LANCING DEV) MISC Use to check blood sugar daily  . Lancets Misc. (ACCU-CHEK FASTCLIX LANCET) KIT Test BID  . metFORMIN (GLUCOPHAGE) 1000 MG tablet Take 1 tablet (1,000 mg total) by mouth 2 (two) times daily with a meal.  . rosuvastatin (CRESTOR) 20 MG tablet Take 1 tablet (20 mg total) by mouth daily.  . TRUEplus Lancets 33G MISC Use to check blood sugar twice daily and prn. Dx Code E11.9     Past Surgical History:  Procedure Laterality Date  . NO PAST SURGERIES      Family History  Problem Relation Age of Onset  . Diabetes Father 30       IDDM  . Diabetes Sister 30       IDDM  . Colon cancer Neg Hx   . Esophageal cancer Neg Hx   . Stomach cancer Neg Hx   . Rectal cancer Neg Hx     New complaints: None today  Social history: Lives with his wife  Controlled substance contract: n/a    Review of Systems  Constitutional: Negative for diaphoresis.  Eyes: Negative for pain.  Respiratory: Negative for shortness of breath.   Cardiovascular:  Negative for chest pain, palpitations and leg swelling.  Gastrointestinal: Negative for abdominal pain.  Endocrine: Negative for polydipsia.  Skin: Negative for rash.  Neurological: Negative for dizziness, weakness and headaches.  Hematological: Does not bruise/bleed easily.  All other systems reviewed and are negative.      Objective:   Physical Exam Vitals and nursing note reviewed.  Constitutional:      Appearance: Normal appearance. He is well-developed and well-nourished.  HENT:     Head: Normocephalic.     Nose: Nose normal.     Mouth/Throat:     Mouth: Oropharynx is clear and moist.  Eyes:     Extraocular Movements: EOM normal.     Pupils: Pupils are equal, round, and reactive to light.  Neck:     Thyroid: No thyroid mass  or thyromegaly.     Vascular: No carotid bruit or JVD.     Trachea: Phonation normal.  Cardiovascular:     Rate and Rhythm: Normal rate and regular rhythm.  Pulmonary:     Effort: Pulmonary effort is normal. No respiratory distress.     Breath sounds: Normal breath sounds.  Abdominal:     General: Bowel sounds are normal. Aorta is normal.     Palpations: Abdomen is soft.     Tenderness: There is no abdominal tenderness.  Musculoskeletal:        General: Normal range of motion.     Cervical back: Normal range of motion and neck supple.  Lymphadenopathy:     Cervical: No cervical adenopathy.  Skin:    General: Skin is warm and dry.  Neurological:     Mental Status: He is alert and oriented to person, place, and time.  Psychiatric:        Mood and Affect: Mood and affect normal.        Behavior: Behavior normal.        Thought Content: Thought content normal.        Judgment: Judgment normal.    BP 125/86   Pulse 96   Temp 97.8 F (36.6 C) (Temporal)   Resp 20   Ht 5' 8"  (1.727 m)   Wt 166 lb (75.3 kg)   SpO2 99%   BMI 25.24 kg/m   HGBA1c 8.0%       Assessment & Plan:  Patrick Valentine comes in today with chief complaint of Medical Management of Chronic Issues   Diagnosis and orders addressed:  1. Hyperlipidemia associated with type 2 diabetes mellitus (HCC) Low fat diet - Lipid panel - rosuvastatin (CRESTOR) 20 MG tablet; Take 1 tablet (20 mg total) by mouth daily.  Dispense: 90 tablet; Refill: 1  2. Diabetes mellitus without complication ([place]) Continue to watch carbs in diet Samples of farxiga given - Bayer DCA Hb A1c Waived - CBC with Differential/Platelet - CMP14+EGFR - Microalbumin / creatinine urine ratio - metFORMIN (GLUCOPHAGE) 1000 MG tablet; Take 1 tablet (1,000 mg total) by mouth 2 (two) times daily with a meal.  Dispense: 180 tablet; Refill: 1  3. Retinitis pigmentosa of both eyes Keep appointment in June  4. BMI 26.0-26.9,adult Discussed  diet and exercise for person with BMI >25 Will recheck weight in 3-6 months    Labs pending Health Maintenance reviewed Diet and exercise encouraged  Follow up plan: 3 months   Patrick Hassell Done, FNP

## 2021-02-05 NOTE — Patient Instructions (Signed)
Diabetes Mellitus and Exercise Exercising regularly is important for overall health, especially for people who have diabetes mellitus. Exercising is not only about losing weight. It has many other health benefits, such as increasing muscle strength and bone density and reducing body fat and stress. This leads to improved fitness, flexibility, and endurance, all of which result in better overall health. What are the benefits of exercise if I have diabetes? Exercise has many benefits for people with diabetes. They include:  Helping to lower and control blood sugar (glucose).  Helping the body to respond better to the hormone insulin by improving insulin sensitivity.  Reducing how much insulin the body needs.  Lowering the risk for heart disease by: ? Lowering "bad" cholesterol and triglyceride levels. ? Increasing "good" cholesterol levels. ? Lowering blood pressure. ? Lowering blood glucose levels. What is my activity plan? Your health care provider or certified diabetes educator can help you make a plan for the type and frequency of exercise that works for you. This is called your activity plan. Be sure to:  Get at least 150 minutes of medium-intensity or high-intensity exercise each week. Exercises may include brisk walking, biking, or water aerobics.  Do stretching and strengthening exercises, such as yoga or weight lifting, at least 2 times a week.  Spread out your activity over at least 3 days of the week.  Get some form of physical activity each day. ? Do not go more than 2 days in a row without some kind of physical activity. ? Avoid being inactive for more than 90 minutes at a time. Take frequent breaks to walk or stretch.  Choose exercises or activities that you enjoy. Set realistic goals.  Start slowly and gradually increase your exercise intensity over time.   How do I manage my diabetes during exercise? Monitor your blood glucose  Check your blood glucose before and  after exercising. If your blood glucose is: ? 240 mg/dL (13.3 mmol/L) or higher before you exercise, check your urine for ketones. These are chemicals created by the liver. If you have ketones in your urine, do not exercise until your blood glucose returns to normal. ? 100 mg/dL (5.6 mmol/L) or lower, eat a snack containing 15-20 grams of carbohydrate. Check your blood glucose 15 minutes after the snack to make sure that your glucose level is above 100 mg/dL (5.6 mmol/L) before you start your exercise.  Know the symptoms of low blood glucose (hypoglycemia) and how to treat it. Your risk for hypoglycemia increases during and after exercise. Follow these tips and your health care provider's instructions  Keep a carbohydrate snack that is fast-acting for use before, during, and after exercise to help prevent or treat hypoglycemia.  Avoid injecting insulin into areas of the body that are going to be exercised. For example, avoid injecting insulin into: ? Your arms, when you are about to play tennis. ? Your legs, when you are about to go jogging.  Keep records of your exercise habits. Doing this can help you and your health care provider adjust your diabetes management plan as needed. Write down: ? Food that you eat before and after you exercise. ? Blood glucose levels before and after you exercise. ? The type and amount of exercise you have done.  Work with your health care provider when you start a new exercise or activity. He or she may need to: ? Make sure that the activity is safe for you. ? Adjust your insulin, other medicines, and food that   you eat.  Drink plenty of water while you exercise. This prevents loss of water (dehydration) and problems caused by a lot of heat in the body (heat stroke).   Where to find more information  American Diabetes Association: www.diabetes.org Summary  Exercising regularly is important for overall health, especially for people who have diabetes  mellitus.  Exercising has many health benefits. It increases muscle strength and bone density and reduces body fat and stress. It also lowers and controls blood glucose.  Your health care provider or certified diabetes educator can help you make an activity plan for the type and frequency of exercise that works for you.  Work with your health care provider to make sure any new activity is safe for you. Also work with your health care provider to adjust your insulin, other medicines, and the food you eat. This information is not intended to replace advice given to you by your health care provider. Make sure you discuss any questions you have with your health care provider. Document Revised: 08/28/2019 Document Reviewed: 08/28/2019 Elsevier Patient Education  2021 Elsevier Inc.  

## 2021-02-06 LAB — MICROALBUMIN / CREATININE URINE RATIO
Creatinine, Urine: 46.2 mg/dL
Microalb/Creat Ratio: <6 mg/g{creat} (ref 0–29)
Microalbumin, Urine: <3 ug/mL

## 2021-03-04 ENCOUNTER — Telehealth: Payer: Self-pay

## 2021-03-04 NOTE — Telephone Encounter (Signed)
Patient called to verify that the paperwork they received from [employer] for help with [employer] is correct. Advised patient that [employer] is the correct company and ok to send income information to them. Patient verbalized understanding

## 2021-03-10 DIAGNOSIS — E1165 Type 2 diabetes mellitus with hyperglycemia: Secondary | ICD-10-CM | POA: Diagnosis not present

## 2021-04-02 ENCOUNTER — Other Ambulatory Visit: Payer: Self-pay

## 2021-04-02 ENCOUNTER — Encounter: Payer: Self-pay | Admitting: Nurse Practitioner

## 2021-04-02 ENCOUNTER — Ambulatory Visit (INDEPENDENT_AMBULATORY_CARE_PROVIDER_SITE_OTHER): Payer: Medicare HMO

## 2021-04-02 ENCOUNTER — Ambulatory Visit (INDEPENDENT_AMBULATORY_CARE_PROVIDER_SITE_OTHER): Payer: Medicare HMO | Admitting: Nurse Practitioner

## 2021-04-02 VITALS — BP 116/79 | HR 88 | Temp 97.9°F | Resp 20 | Ht 68.0 in | Wt 162.0 lb

## 2021-04-02 DIAGNOSIS — M25561 Pain in right knee: Secondary | ICD-10-CM

## 2021-04-02 MED ORDER — CELECOXIB 200 MG PO CAPS: 200.0000 mg | ORAL_CAPSULE | Freq: Two times a day (BID) | ORAL | 2 refills | Status: DC

## 2021-04-02 MED ORDER — KETOROLAC TROMETHAMINE 60 MG/2ML IM SOLN
60.0000 mg | Freq: Once | INTRAMUSCULAR | Status: AC
  Administered 2021-04-02: 60 mg via INTRAMUSCULAR

## 2021-04-02 NOTE — Progress Notes (Signed)
   Subjective:    Patient ID: Patrick Valentine, male    DOB: 13-Feb-1961, 60 y.o.   MRN: [id number]   Chief Complaint: Knee Pain   HPI Patient come sin today accompanied by his wife. He is c/o of right knee pain that started 3 weeks ago. He walks on treadmill and 3 weeks ago he bumped it up some and when he got off of it he noticed his knee started hurting. Has been hurting every since. He has been putting topical creams on it with no relief.   Review of Systems  Cardiovascular: Negative.   Gastrointestinal: Negative.   Musculoskeletal: Positive for arthralgias (right knee).  All other systems reviewed and are negative.      Objective:   Physical Exam Vitals and nursing note reviewed.  Constitutional:      Appearance: Normal appearance.  Cardiovascular:     Rate and Rhythm: Normal rate and regular rhythm.     Heart sounds: Normal heart sounds.  Pulmonary:     Breath sounds: Normal breath sounds.  Musculoskeletal:     Comments: FROM Of right knee without pain No patella tenderness No effusion noted all ligaments intact  Skin:    General: Skin is warm.  Neurological:     General: No focal deficit present.     Mental Status: He is alert and oriented to person, place, and time.  Psychiatric:        Mood and Affect: Mood normal.        Behavior: Behavior normal.    BP 116/79   Pulse 88   Temp 97.9 F (36.6 C) (Temporal)   Resp 20   Ht 5\' 8"  (1.727 m)   Wt 162 lb (73.5 kg)   BMI 24.63 kg/m    Knee xray- mild osteoarthritis- Preliminary reading by Ronnald Collum, FNP  [facility]      Assessment & Plan:  Patrick Valentine in today with chief complaint of Knee Pain   1. Acute pain of right knee oist heat Rest RTO prn - DG Knee 1-2 Views Right - ketorolac (TORADOL) injection 60 mg - celecoxib (CELEBREX) 200 MG capsule; Take 1 capsule (200 mg total) by mouth 2 (two) times daily.  Dispense: 60 capsule; Refill: 2    The above assessment and management plan was discussed  with the patient. The patient verbalized understanding of and has agreed to the management plan. Patient is aware to call the clinic if symptoms persist or worsen. Patient is aware when to return to the clinic for a follow-up visit. Patient educated on when it is appropriate to go to the emergency department.   Mary-Margaret Hassell Done, FNP

## 2021-04-02 NOTE — Patient Instructions (Signed)
Acute Knee Pain, Adult Many things can cause knee pain. Sometimes, knee pain is sudden (acute) and may be caused by damage, swelling, or irritation of the muscles and tissues that support your knee. The pain often goes away on its own with time and rest. If the pain does not go away, tests may be done to find out what is causing the pain. Follow these instructions at home: If you have a knee sleeve or brace:  Wear the knee sleeve or brace as told by your doctor. Take it off only as told by your doctor.  Loosen it if your toes: ? Tingle. ? Become numb. ? Turn cold and blue.  Keep it clean.  If the knee sleeve or brace is not waterproof: ? Do not let it get wet. ? Cover it with a watertight covering when you take a bath or shower.   Activity  Rest your knee.  Do not do things that cause pain or make pain worse.  Avoid activities where both feet leave the ground at the same time (high-impact activities). Examples are running, jumping rope, and doing jumping jacks.  Work with a physical therapist to make a safe exercise program, as told by your doctor. Managing pain, stiffness, and swelling  If told, put ice on the knee. To do this: ? If you have a removable knee sleeve or brace, take it off as told by your doctor. ? Put ice in a plastic bag. ? Place a towel between your skin and the bag. ? Leave the ice on for 20 minutes, 2-3 times a day. ? Take off the ice if your skin turns bright red. This is very important. If you cannot feel pain, heat, or cold, you have a greater risk of damage to the area.  If told, use an elastic bandage to put pressure (compression) on your injured knee.  Raise your knee above the level of your heart while you are sitting or lying down.  Sleep with a pillow under your knee.   General instructions  Take over-the-counter and prescription medicines only as told by your doctor.  Do not smoke or use any products that contain nicotine or tobacco. If you  need help quitting, ask your doctor.  If you are overweight, work with your doctor and a food expert (dietitian) to set goals to lose weight. Being overweight can make your knee hurt more.  Watch for any changes in your symptoms.  Keep all follow-up visits. Contact a doctor if:  The knee pain does not stop.  The knee pain changes or gets worse.  You have a fever along with knee pain.  Your knee is red or feels warm when you touch it.  Your knee gives out or locks up. Get help right away if:  Your knee swells, and the swelling gets worse.  You cannot move your knee.  You have very bad knee pain that does not get better with pain medicine. Summary  Many things can cause knee pain. The pain often goes away on its own with time and rest.  Your doctor may do tests to find out the cause of the pain.  Watch for any changes in your symptoms. Relieve your pain with rest, medicines, light activity, and use of ice.  Get help right away if you cannot move your knee or your knee pain is very bad. This information is not intended to replace advice given to you by your health care provider. Make sure you discuss   any questions you have with your health care provider. Document Revised: 05/15/2020 Document Reviewed: 05/15/2020 Elsevier Patient Education  2021 Elsevier Inc.  

## 2021-05-23 ENCOUNTER — Encounter: Payer: Self-pay | Admitting: Nurse Practitioner

## 2021-05-23 ENCOUNTER — Ambulatory Visit (INDEPENDENT_AMBULATORY_CARE_PROVIDER_SITE_OTHER): Payer: Medicare HMO | Admitting: Nurse Practitioner

## 2021-05-23 ENCOUNTER — Other Ambulatory Visit: Payer: Self-pay

## 2021-05-23 VITALS — BP 144/91 | HR 91 | Temp 97.7°F | Resp 20 | Ht 68.0 in | Wt 160.0 lb

## 2021-05-23 DIAGNOSIS — Z6826 Body mass index (BMI) 26.0-26.9, adult: Secondary | ICD-10-CM | POA: Diagnosis not present

## 2021-05-23 DIAGNOSIS — E785 Hyperlipidemia, unspecified: Secondary | ICD-10-CM | POA: Diagnosis not present

## 2021-05-23 DIAGNOSIS — R972 Elevated prostate specific antigen [PSA]: Secondary | ICD-10-CM | POA: Diagnosis not present

## 2021-05-23 DIAGNOSIS — E119 Type 2 diabetes mellitus without complications: Secondary | ICD-10-CM

## 2021-05-23 DIAGNOSIS — E1169 Type 2 diabetes mellitus with other specified complication: Secondary | ICD-10-CM

## 2021-05-23 LAB — BAYER DCA HB A1C WAIVED: HB A1C (BAYER DCA - WAIVED): 7 % — ABNORMAL HIGH (ref <7.0)

## 2021-05-23 MED ORDER — DAPAGLIFLOZIN PROPANEDIOL 10 MG PO TABS: 10.0000 mg | ORAL_TABLET | Freq: Every day | ORAL | 4 refills | Status: DC

## 2021-05-23 MED ORDER — METFORMIN HCL 1000 MG PO TABS: 1000.0000 mg | ORAL_TABLET | Freq: Two times a day (BID) | ORAL | 1 refills | Status: DC

## 2021-05-23 MED ORDER — FREESTYLE LIBRE 14 DAY SENSOR MISC
1.0000 | 6 refills | Status: DC
Start: 2021-05-23 — End: 2022-03-10

## 2021-05-23 MED ORDER — ROSUVASTATIN CALCIUM 20 MG PO TABS: 20.0000 mg | ORAL_TABLET | Freq: Every day | ORAL | 1 refills | Status: DC

## 2021-05-23 NOTE — Patient Instructions (Signed)

## 2021-05-23 NOTE — Progress Notes (Signed)
Subjective:    Patient ID: Patrick Valentine, male    DOB: 21-Apr-1961, 60 y.o.   MRN: [id number]  Chief Complaint:    HPI:  1. Hyperlipidemia associated with type 2 diabetes mellitus (Patrick Valentine) Does try to watch diet and he does exercise. He is on crestor without problems. Lab Results  Component Value Date   CHOL 154 02/05/2021   HDL 37 (L) 02/05/2021   LDLCALC 93 02/05/2021   TRIG 133 02/05/2021   CHOLHDL 4.2 02/05/2021     2. Diabetes mellitus without complication (Patrick Valentine) Fasting blood sugars are running blow 160 consistently. He has no low blood sugars. Lab Results  Component Value Date   HGBA1C 8.0 (H) 02/05/2021     3. Elevated PSA No issues voiding Lab Results  Component Value Date   PSA1 4.4 (H) 01/19/2020   PSA 2.5 07/20/2014      4. BMI 26.0-26.9,adult No recent weight changes Wt Readings from Last 3 Encounters:  05/23/21 160 lb (72.6 kg)  04/02/21 162 lb (73.5 kg)  02/05/21 166 lb (75.3 kg)   BMI Readings from Last 3 Encounters:  05/23/21 24.33 kg/m  04/02/21 24.63 kg/m  02/05/21 25.24 kg/m       Outpatient Encounter Medications as of 05/23/2021  Medication Sig   ACCU-CHEK SOFTCLIX LANCETS lancets Test BID   Alcohol Swabs (B-D SINGLE USE SWABS REGULAR) PADS Test BID   Blood Glucose Calibration (ACCU-CHEK AVIVA) SOLN Use with glucose meter   Blood Glucose Monitoring Suppl (ACCU-CHEK AVIVA PLUS) w/Device KIT Test BID   celecoxib (CELEBREX) 200 MG capsule Take 1 capsule (200 mg total) by mouth 2 (two) times daily.   Continuous Blood Gluc Receiver (FREESTYLE LIBRE 2 READER) DEVI 1 each by Does not apply route daily.   Continuous Blood Gluc Sensor (FREESTYLE LIBRE 14 DAY SENSOR) MISC 1 each by Does not apply route every 14 (fourteen) days.   dapagliflozin propanediol (FARXIGA) 10 MG TABS tablet Take 1 tablet (10 mg total) by mouth daily before breakfast.   glucose blood (ACCU-CHEK AVIVA PLUS) test strip Test BID   Lancet Devices (ONE TOUCH DELICA  LANCING DEV) MISC Use to check blood sugar daily   Lancets Misc. (ACCU-CHEK FASTCLIX LANCET) KIT Test BID   metFORMIN (GLUCOPHAGE) 1000 MG tablet Take 1 tablet (1,000 mg total) by mouth 2 (two) times daily with a meal.   rosuvastatin (CRESTOR) 20 MG tablet Take 1 tablet (20 mg total) by mouth daily.   TRUEplus Lancets 33G MISC Use to check blood sugar twice daily and prn. Dx Code E11.9   No facility-administered encounter medications on file as of 05/23/2021.    Past Surgical History:  Procedure Laterality Date   NO PAST SURGERIES      Family History  Problem Relation Age of Onset   Diabetes Father 42       IDDM   Diabetes Sister 53       IDDM   Colon cancer Neg Hx    Esophageal cancer Neg Hx    Stomach cancer Neg Hx    Rectal cancer Neg Hx     New complaints: None today  Social history: Lives with wife. Keeps his grand kids alot  Controlled substance contract: n/a     Review of Systems  Constitutional:  Negative for diaphoresis.  Eyes:  Negative for pain.  Respiratory:  Negative for shortness of breath.   Cardiovascular:  Negative for chest pain, palpitations and leg swelling.  Gastrointestinal:  Negative for abdominal pain.  Endocrine: Negative for polydipsia.  Skin:  Negative for rash.  Neurological:  Negative for dizziness, weakness and headaches.  Hematological:  Does not bruise/bleed easily.  All other systems reviewed and are negative.     Objective:   Physical Exam Vitals and nursing note reviewed.  Constitutional:      Appearance: Normal appearance. He is well-developed.  HENT:     Head: Normocephalic.     Nose: Nose normal.  Eyes:     Pupils: Pupils are equal, round, and reactive to light.  Neck:     Thyroid: No thyroid mass or thyromegaly.     Vascular: No carotid bruit or JVD.     Trachea: Phonation normal.  Cardiovascular:     Rate and Rhythm: Normal rate and regular rhythm.  Pulmonary:     Effort: Pulmonary effort is normal. No  respiratory distress.     Breath sounds: Normal breath sounds.  Abdominal:     General: Bowel sounds are normal.     Palpations: Abdomen is soft.     Tenderness: There is no abdominal tenderness.  Musculoskeletal:        General: Normal range of motion.     Cervical back: Normal range of motion and neck supple.  Lymphadenopathy:     Cervical: No cervical adenopathy.  Skin:    General: Skin is warm and dry.  Neurological:     Mental Status: He is alert and oriented to person, place, and time.  Psychiatric:        Behavior: Behavior normal.        Thought Content: Thought content normal.        Judgment: Judgment normal.    BP (!) 144/91   Pulse 91   Temp 97.7 F (36.5 C) (Temporal)   Resp 20   Ht _0  (1.727 m)   Wt 160 lb (72.6 kg)   SpO2 99%   BMI 24.33 kg/m   Hgba1c 7.0%      Assessment & Plan:  Patrick Valentine comes in today with chief complaint of Medical Management of Chronic Issues   Diagnosis and orders addressed:  1. Hyperlipidemia associated with type 2 diabetes mellitus (Patrick Valentine) W fat deit - CBC with Differential/Platelet - Lipid panel - rosuvastatin (CRESTOR) 20 MG tablet; Take 1 tablet (20 mg total) by mouth daily.  Dispense: 90 tablet; Refill: 1  2. Diabetes mellitus without complication (Patrick Valentine) Watch carbs in diet - Bayer DCA Hb A1c Waived - CMP14+EGFR - dapagliflozin propanediol (FARXIGA) 10 MG TABS tablet; Take 1 tablet (10 mg total) by mouth daily before breakfast.  Dispense: 90 tablet; Refill: 4 - Continuous Blood Gluc Sensor (FREESTYLE LIBRE 14 DAY SENSOR) MISC; 1 each by Does not apply route every 14 (fourteen) days.  Dispense: 2 each; Refill: 6 - metFORMIN (GLUCOPHAGE) 1000 MG tablet; Take 1 tablet (1,000 mg total) by mouth 2 (two) times daily with a meal.  Dispense: 180 tablet; Refill: 1  3. Elevated PSA Labs pending - PSA, total and free  4. BMI 26.0-26.9,adult Discussed diet and exercise for person with BMI >25 Will recheck weight in  3-6 months   Labs pending Health Maintenance reviewed Diet and exercise encouraged  Follow up plan: 3 months   Patrick Hassell Done, FNP

## 2021-05-24 LAB — CBC WITH DIFFERENTIAL/PLATELET
Basophils Absolute: 0.1 10*3/uL (ref 0.0–0.2)
Basos: 1 %
EOS (ABSOLUTE): 0.2 10*3/uL (ref 0.0–0.4)
Eos: 2 %
Hematocrit: 48.6 % (ref 37.5–51.0)
Hemoglobin: 16.4 g/dL (ref 13.0–17.7)
Immature Grans (Abs): 0 10*3/uL (ref 0.0–0.1)
Immature Granulocytes: 0 %
Lymphocytes Absolute: 2.5 10*3/uL (ref 0.7–3.1)
Lymphs: 29 %
MCH: 30.7 pg (ref 26.6–33.0)
MCHC: 33.7 g/dL (ref 31.5–35.7)
MCV: 91 fL (ref 79–97)
Monocytes Absolute: 0.6 10*3/uL (ref 0.1–0.9)
Monocytes: 7 %
Neutrophils Absolute: 5.1 10*3/uL (ref 1.4–7.0)
Neutrophils: 61 %
Platelets: 307 10*3/uL (ref 150–450)
RBC: 5.35 x10E6/uL (ref 4.14–5.80)
RDW: 12.9 % (ref 11.6–15.4)
WBC: 8.4 10*3/uL (ref 3.4–10.8)

## 2021-05-24 LAB — CMP14+EGFR
ALT: 13 IU/L (ref 0–44)
AST: 13 IU/L (ref 0–40)
Albumin/Globulin Ratio: 1.7 (ref 1.2–2.2)
Albumin: 4.7 g/dL (ref 3.8–4.9)
Alkaline Phosphatase: 61 IU/L (ref 44–121)
BUN/Creatinine Ratio: 12 (ref 10–24)
BUN: 12 mg/dL (ref 8–27)
Bilirubin Total: 0.5 mg/dL (ref 0.0–1.2)
CO2: 22 mmol/L (ref 20–29)
Calcium: 10.6 mg/dL — ABNORMAL HIGH (ref 8.6–10.2)
Chloride: 98 mmol/L (ref 96–106)
Creatinine, Ser: 1 mg/dL (ref 0.76–1.27)
Globulin, Total: 2.7 g/dL (ref 1.5–4.5)
Glucose: 138 mg/dL — ABNORMAL HIGH (ref 65–99)
Potassium: 5.7 mmol/L — ABNORMAL HIGH (ref 3.5–5.2)
Sodium: 138 mmol/L (ref 134–144)
Total Protein: 7.4 g/dL (ref 6.0–8.5)
eGFR: 86 mL/min/{1.73_m2} (ref >59)

## 2021-05-24 LAB — LIPID PANEL
Chol/HDL Ratio: 3.9 ratio (ref 0.0–5.0)
Cholesterol, Total: 186 mg/dL (ref 100–199)
HDL: 48 mg/dL (ref >39)
LDL Chol Calc (NIH): 109 mg/dL — ABNORMAL HIGH (ref 0–99)
Triglycerides: 164 mg/dL — ABNORMAL HIGH (ref 0–149)
VLDL Cholesterol Cal: 29 mg/dL (ref 5–40)

## 2021-05-24 LAB — PSA, TOTAL AND FREE
PSA, Free Pct: 16 %
PSA, Free: 0.72 ng/mL
Prostate Specific Ag, Serum: 4.5 ng/mL — ABNORMAL HIGH (ref 0.0–4.0)

## 2021-05-26 ENCOUNTER — Other Ambulatory Visit: Payer: Self-pay

## 2021-05-26 DIAGNOSIS — E875 Hyperkalemia: Secondary | ICD-10-CM

## 2021-06-03 ENCOUNTER — Other Ambulatory Visit: Payer: Self-pay

## 2021-06-03 ENCOUNTER — Other Ambulatory Visit: Payer: Medicare HMO

## 2021-06-03 DIAGNOSIS — E875 Hyperkalemia: Secondary | ICD-10-CM | POA: Diagnosis not present

## 2021-06-03 LAB — BMP8+EGFR
BUN/Creatinine Ratio: 14 (ref 10–24)
BUN: 13 mg/dL (ref 8–27)
CO2: 22 mmol/L (ref 20–29)
Calcium: 10.3 mg/dL — ABNORMAL HIGH (ref 8.6–10.2)
Chloride: 98 mmol/L (ref 96–106)
Creatinine, Ser: 0.95 mg/dL (ref 0.76–1.27)
Glucose: 150 mg/dL — ABNORMAL HIGH (ref 65–99)
Potassium: 5.1 mmol/L (ref 3.5–5.2)
Sodium: 138 mmol/L (ref 134–144)
eGFR: 92 mL/min/{1.73_m2} (ref >59)

## 2021-06-04 DIAGNOSIS — E1165 Type 2 diabetes mellitus with hyperglycemia: Secondary | ICD-10-CM | POA: Diagnosis not present

## 2021-06-11 DIAGNOSIS — H53453 Other localized visual field defect, bilateral: Secondary | ICD-10-CM | POA: Diagnosis not present

## 2021-06-11 DIAGNOSIS — Z135 Encounter for screening for eye and ear disorders: Secondary | ICD-10-CM | POA: Diagnosis not present

## 2021-06-11 DIAGNOSIS — H5362 Acquired night blindness: Secondary | ICD-10-CM | POA: Diagnosis not present

## 2021-06-11 DIAGNOSIS — H3550 Unspecified hereditary retinal dystrophy: Secondary | ICD-10-CM | POA: Diagnosis not present

## 2021-06-11 DIAGNOSIS — H469 Unspecified optic neuritis: Secondary | ICD-10-CM | POA: Diagnosis not present

## 2021-06-11 DIAGNOSIS — H543 Unqualified visual loss, both eyes: Secondary | ICD-10-CM | POA: Diagnosis not present

## 2021-06-11 DIAGNOSIS — Q8789 Other specified congenital malformation syndromes, not elsewhere classified: Secondary | ICD-10-CM | POA: Diagnosis not present

## 2021-06-11 DIAGNOSIS — Z974 Presence of external hearing-aid: Secondary | ICD-10-CM | POA: Diagnosis not present

## 2021-06-11 DIAGNOSIS — H903 Sensorineural hearing loss, bilateral: Secondary | ICD-10-CM | POA: Diagnosis not present

## 2021-06-11 DIAGNOSIS — Z83518 Family history of other specified eye disorder: Secondary | ICD-10-CM | POA: Diagnosis not present

## 2021-06-11 DIAGNOSIS — H3554 Dystrophies primarily involving the retinal pigment epithelium: Secondary | ICD-10-CM | POA: Diagnosis not present

## 2021-06-12 DIAGNOSIS — H5362 Acquired night blindness: Secondary | ICD-10-CM | POA: Diagnosis not present

## 2021-06-12 DIAGNOSIS — H543 Unqualified visual loss, both eyes: Secondary | ICD-10-CM | POA: Diagnosis not present

## 2021-06-12 DIAGNOSIS — Z974 Presence of external hearing-aid: Secondary | ICD-10-CM | POA: Diagnosis not present

## 2021-06-12 DIAGNOSIS — H3554 Dystrophies primarily involving the retinal pigment epithelium: Secondary | ICD-10-CM | POA: Diagnosis not present

## 2021-06-12 DIAGNOSIS — H903 Sensorineural hearing loss, bilateral: Secondary | ICD-10-CM | POA: Diagnosis not present

## 2021-06-12 DIAGNOSIS — Z83518 Family history of other specified eye disorder: Secondary | ICD-10-CM | POA: Diagnosis not present

## 2021-06-12 DIAGNOSIS — Q8789 Other specified congenital malformation syndromes, not elsewhere classified: Secondary | ICD-10-CM | POA: Diagnosis not present

## 2021-06-12 DIAGNOSIS — Z135 Encounter for screening for eye and ear disorders: Secondary | ICD-10-CM | POA: Diagnosis not present

## 2021-06-12 DIAGNOSIS — H53453 Other localized visual field defect, bilateral: Secondary | ICD-10-CM | POA: Diagnosis not present

## 2021-07-24 DIAGNOSIS — Z23 Encounter for immunization: Secondary | ICD-10-CM | POA: Diagnosis not present

## 2021-08-26 ENCOUNTER — Ambulatory Visit (INDEPENDENT_AMBULATORY_CARE_PROVIDER_SITE_OTHER): Payer: Medicare HMO | Admitting: Nurse Practitioner

## 2021-08-26 ENCOUNTER — Other Ambulatory Visit: Payer: Self-pay

## 2021-08-26 ENCOUNTER — Encounter: Payer: Self-pay | Admitting: Nurse Practitioner

## 2021-08-26 VITALS — BP 132/77 | HR 89 | Temp 98.1°F | Resp 20 | Ht 68.0 in | Wt 157.0 lb

## 2021-08-26 DIAGNOSIS — E785 Hyperlipidemia, unspecified: Secondary | ICD-10-CM | POA: Diagnosis not present

## 2021-08-26 DIAGNOSIS — E1169 Type 2 diabetes mellitus with other specified complication: Secondary | ICD-10-CM

## 2021-08-26 DIAGNOSIS — E119 Type 2 diabetes mellitus without complications: Secondary | ICD-10-CM | POA: Diagnosis not present

## 2021-08-26 DIAGNOSIS — R972 Elevated prostate specific antigen [PSA]: Secondary | ICD-10-CM

## 2021-08-26 DIAGNOSIS — Z6823 Body mass index (BMI) 23.0-23.9, adult: Secondary | ICD-10-CM | POA: Diagnosis not present

## 2021-08-26 LAB — BAYER DCA HB A1C WAIVED: HB A1C (BAYER DCA - WAIVED): 7.2 % — ABNORMAL HIGH (ref 4.8–5.6)

## 2021-08-26 MED ORDER — ROSUVASTATIN CALCIUM 20 MG PO TABS: 20.0000 mg | ORAL_TABLET | Freq: Every day | ORAL | 1 refills | Status: DC

## 2021-08-26 MED ORDER — METFORMIN HCL 1000 MG PO TABS: 1000.0000 mg | ORAL_TABLET | Freq: Two times a day (BID) | ORAL | 1 refills | Status: DC

## 2021-08-26 MED ORDER — DAPAGLIFLOZIN PROPANEDIOL 10 MG PO TABS: 10.0000 mg | ORAL_TABLET | Freq: Every day | ORAL | 1 refills | Status: DC

## 2021-08-26 NOTE — Progress Notes (Signed)
Subjective:    Patient ID: Patrick Valentine, male    DOB: 10/03/1961, 60 y.o.   MRN: [id number]   Chief Complaint: medical management of chronic issues     HPI:  1. Hyperlipidemia associated with type 2 diabetes mellitus ([place]) Does try to watch.......................................................... diet and does do exercise several days a week. Lab Results  Component Value Date   CHOL 186 05/23/2021   HDL 48 05/23/2021   LDLCALC 109 (H) 05/23/2021   TRIG 164 (H) 05/23/2021   CHOLHDL 3.9 05/23/2021     2. Diabetes mellitus without complication (HCC) Fasting blood sugars are running around 110-160. He does try to watch his diet. Lab Results  Component Value Date   HGBA1C 7.0 (H) 05/23/2021     3. Elevated PSA Denies voiding or getting stream started. Lab Results  Component Value Date   PSA1 4.5 (H) 05/23/2021   PSA1 4.4 (H) 01/19/2020   PSA 2.5 07/20/2014      4. BMI 24.0-24.9,adult No recent weight changes Wt Readings from Last 3 Encounters:  08/26/21 157 lb (71.2 kg)  05/23/21 160 lb (72.6 kg)  04/02/21 162 lb (73.5 kg)   BMI Readings from Last 3 Encounters:  08/26/21 23.87 kg/m  05/23/21 24.33 kg/m  04/02/21 24.63 kg/m       Outpatient Encounter Medications as of 08/26/2021  Medication Sig   ACCU-CHEK SOFTCLIX LANCETS lancets Test BID   Alcohol Swabs (B-D SINGLE USE SWABS REGULAR) PADS Test BID   Blood Glucose Calibration (ACCU-CHEK AVIVA) SOLN Use with glucose meter   Blood Glucose Monitoring Suppl (ACCU-CHEK AVIVA PLUS) w/Device KIT Test BID   celecoxib (CELEBREX) 200 MG capsule Take 1 capsule (200 mg total) by mouth 2 (two) times daily.   Continuous Blood Gluc Receiver (FREESTYLE LIBRE 2 READER) DEVI 1 each by Does not apply route daily.   Continuous Blood Gluc Sensor (FREESTYLE LIBRE 14 DAY SENSOR) MISC 1 each by Does not apply route every 14 (fourteen) days.   dapagliflozin propanediol (FARXIGA) 10 MG TABS tablet Take 1 tablet (10 mg  total) by mouth daily before breakfast.   glucose blood (ACCU-CHEK AVIVA PLUS) test strip Test BID   Lancet Devices (ONE TOUCH DELICA LANCING DEV) MISC Use to check blood sugar daily   Lancets Misc. (ACCU-CHEK FASTCLIX LANCET) KIT Test BID   metFORMIN (GLUCOPHAGE) 1000 MG tablet Take 1 tablet (1,000 mg total) by mouth 2 (two) times daily with a meal.   rosuvastatin (CRESTOR) 20 MG tablet Take 1 tablet (20 mg total) by mouth daily.   TRUEplus Lancets 33G MISC Use to check blood sugar twice daily and prn. Dx Code E11.9   No facility-administered encounter medications on file as of 08/26/2021.    Past Surgical History:  Procedure Laterality Date   NO PAST SURGERIES      Family History  Problem Relation Age of Onset   Diabetes Father 52       IDDM   Diabetes Sister 93       IDDM   Colon cancer Neg Hx    Esophageal cancer Neg Hx    Stomach cancer Neg Hx    Rectal cancer Neg Hx     New complaints: None today  Social history: Lives with wife  Controlled substance contract: n/a     Review of Systems  Constitutional:  Negative for diaphoresis.  Eyes:  Negative for pain.  Respiratory:  Negative for shortness of breath.   Cardiovascular:  Negative for chest pain, palpitations and  leg swelling.  Gastrointestinal:  Negative for abdominal pain.  Endocrine: Negative for polydipsia.  Skin:  Negative for rash.  Neurological:  Negative for dizziness, weakness and headaches.  Hematological:  Does not bruise/bleed easily.  All other systems reviewed and are negative.     Objective:   Physical Exam Vitals and nursing note reviewed.  Constitutional:      Appearance: Normal appearance. He is well-developed.  HENT:     Head: Normocephalic.     Nose: Nose normal.  Eyes:     Pupils: Pupils are equal, round, and reactive to light.  Neck:     Thyroid: No thyroid mass or thyromegaly.     Vascular: No carotid bruit or JVD.     Trachea: Phonation normal.  Cardiovascular:     Rate  and Rhythm: Normal rate and regular rhythm.  Pulmonary:     Effort: Pulmonary effort is normal. No respiratory distress.     Breath sounds: Normal breath sounds.  Abdominal:     General: Bowel sounds are normal.     Palpations: Abdomen is soft.     Tenderness: There is no abdominal tenderness.  Musculoskeletal:        General: Normal range of motion.     Cervical back: Normal range of motion and neck supple.  Lymphadenopathy:     Cervical: No cervical adenopathy.  Skin:    General: Skin is warm and dry.  Neurological:     Mental Status: He is alert and oriented to person, place, and time.  Psychiatric:        Behavior: Behavior normal.        Thought Content: Thought content normal.        Judgment: Judgment normal.    BP 132/77   Pulse 89   Temp 98.1 F (36.7 C) (Temporal)   Resp 20   Ht 5' 8"  (1.727 m)   Wt 157 lb (71.2 kg)   SpO2 100%   BMI 23.87 kg/m   HGBA1c 7.2%      Assessment & Plan:   Suhayb Anzalone comes in today with chief complaint of Medical Management of Chronic Issues   Diagnosis and orders addressed:  1. Hyperlipidemia associated with type 2 diabetes mellitus (HCC) Low fat diet - Lipid panel - rosuvastatin (CRESTOR) 20 MG tablet; Take 1 tablet (20 mg total) by mouth daily.  Dispense: 90 tablet; Refill: 1  2. Diabetes mellitus without complication ([place]) Contniue towtachcarbs in diet - Bayer DCA Hb A1c Waived - CBC with Differential/Platelet - CMP14+EGFR - dapagliflozin propanediol (FARXIGA) 10 MG TABS tablet; Take 1 tablet (10 mg total) by mouth daily before breakfast.  Dispense: 90 tablet; Refill: 1 - metFORMIN (GLUCOPHAGE) 1000 MG tablet; Take 1 tablet (1,000 mg total) by mouth 2 (two) times daily with a meal.  Dispense: 180 tablet; Refill: 1  3. Elevated PSA Will recheck in April  4. BMI 23.0-23.9, adult Discussed diet and exercise for person with BMI >25 Will recheck weight in 3-6 months    Labs pending Health Maintenance  reviewed Diet and exercise encouraged  Follow up plan: 3 months   Mary-Margaret Hassell Done, FNP

## 2021-08-26 NOTE — Patient Instructions (Signed)

## 2021-08-27 LAB — CMP14+EGFR
ALT: 13 IU/L (ref 0–44)
AST: 11 IU/L (ref 0–40)
Albumin/Globulin Ratio: 1.9 (ref 1.2–2.2)
Albumin: 4.7 g/dL (ref 3.8–4.8)
Alkaline Phosphatase: 70 IU/L (ref 44–121)
BUN/Creatinine Ratio: 16 (ref 10–24)
BUN: 13 mg/dL (ref 8–27)
Bilirubin Total: 0.4 mg/dL (ref 0.0–1.2)
CO2: 22 mmol/L (ref 20–29)
Calcium: 10.1 mg/dL (ref 8.6–10.2)
Chloride: 98 mmol/L (ref 96–106)
Creatinine, Ser: 0.82 mg/dL (ref 0.76–1.27)
Globulin, Total: 2.5 g/dL (ref 1.5–4.5)
Glucose: 158 mg/dL — ABNORMAL HIGH (ref 65–99)
Potassium: 5.1 mmol/L (ref 3.5–5.2)
Sodium: 137 mmol/L (ref 134–144)
Total Protein: 7.2 g/dL (ref 6.0–8.5)
eGFR: 100 mL/min/{1.73_m2} (ref >59)

## 2021-08-27 LAB — CBC WITH DIFFERENTIAL/PLATELET
Basophils Absolute: 0.1 10*3/uL (ref 0.0–0.2)
Basos: 1 %
EOS (ABSOLUTE): 0.1 10*3/uL (ref 0.0–0.4)
Eos: 2 %
Hematocrit: 47.9 % (ref 37.5–51.0)
Hemoglobin: 16.5 g/dL (ref 13.0–17.7)
Immature Grans (Abs): 0 10*3/uL (ref 0.0–0.1)
Immature Granulocytes: 0 %
Lymphocytes Absolute: 2.1 10*3/uL (ref 0.7–3.1)
Lymphs: 30 %
MCH: 30.3 pg (ref 26.6–33.0)
MCHC: 34.4 g/dL (ref 31.5–35.7)
MCV: 88 fL (ref 79–97)
Monocytes Absolute: 0.5 10*3/uL (ref 0.1–0.9)
Monocytes: 8 %
Neutrophils Absolute: 4.2 10*3/uL (ref 1.4–7.0)
Neutrophils: 59 %
Platelets: 291 10*3/uL (ref 150–450)
RBC: 5.44 x10E6/uL (ref 4.14–5.80)
RDW: 12.7 % (ref 11.6–15.4)
WBC: 7.1 10*3/uL (ref 3.4–10.8)

## 2021-08-27 LAB — LIPID PANEL
Chol/HDL Ratio: 4.7 ratio (ref 0.0–5.0)
Cholesterol, Total: 194 mg/dL (ref 100–199)
HDL: 41 mg/dL (ref >39)
LDL Chol Calc (NIH): 124 mg/dL — ABNORMAL HIGH (ref 0–99)
Triglycerides: 164 mg/dL — ABNORMAL HIGH (ref 0–149)
VLDL Cholesterol Cal: 29 mg/dL (ref 5–40)

## 2021-08-27 LAB — PSA, TOTAL AND FREE
PSA, Free Pct: 16.6 %
PSA, Free: 0.73 ng/mL
Prostate Specific Ag, Serum: 4.4 ng/mL — ABNORMAL HIGH (ref 0.0–4.0)

## 2021-10-06 ENCOUNTER — Telehealth: Payer: Self-pay | Admitting: Nurse Practitioner

## 2021-10-06 NOTE — Telephone Encounter (Signed)
LVM for pt to rtn my call to schedule AWV with [facility].  

## 2021-10-16 ENCOUNTER — Ambulatory Visit: Payer: Medicare HMO

## 2021-10-17 ENCOUNTER — Ambulatory Visit (INDEPENDENT_AMBULATORY_CARE_PROVIDER_SITE_OTHER): Payer: Medicare HMO

## 2021-10-17 ENCOUNTER — Other Ambulatory Visit: Payer: Self-pay

## 2021-10-17 DIAGNOSIS — Z23 Encounter for immunization: Secondary | ICD-10-CM

## 2021-11-03 DIAGNOSIS — H903 Sensorineural hearing loss, bilateral: Secondary | ICD-10-CM | POA: Diagnosis not present

## 2021-11-12 DIAGNOSIS — E1165 Type 2 diabetes mellitus with hyperglycemia: Secondary | ICD-10-CM | POA: Diagnosis not present

## 2021-11-27 ENCOUNTER — Ambulatory Visit: Payer: Medicare HMO | Admitting: Nurse Practitioner

## 2021-12-09 ENCOUNTER — Ambulatory Visit (INDEPENDENT_AMBULATORY_CARE_PROVIDER_SITE_OTHER): Payer: Medicare HMO | Admitting: Nurse Practitioner

## 2021-12-09 ENCOUNTER — Encounter: Payer: Self-pay | Admitting: Nurse Practitioner

## 2021-12-09 VITALS — BP 131/85 | HR 87 | Temp 99.1°F | Resp 20 | Ht 68.0 in | Wt 156.0 lb

## 2021-12-09 DIAGNOSIS — E119 Type 2 diabetes mellitus without complications: Secondary | ICD-10-CM | POA: Diagnosis not present

## 2021-12-09 DIAGNOSIS — Z6826 Body mass index (BMI) 26.0-26.9, adult: Secondary | ICD-10-CM

## 2021-12-09 DIAGNOSIS — E1169 Type 2 diabetes mellitus with other specified complication: Secondary | ICD-10-CM | POA: Diagnosis not present

## 2021-12-09 DIAGNOSIS — E785 Hyperlipidemia, unspecified: Secondary | ICD-10-CM

## 2021-12-09 LAB — BAYER DCA HB A1C WAIVED: HB A1C (BAYER DCA - WAIVED): 9 % — ABNORMAL HIGH (ref 4.8–5.6)

## 2021-12-09 MED ORDER — DAPAGLIFLOZIN PROPANEDIOL 10 MG PO TABS: 10.0000 mg | ORAL_TABLET | Freq: Every day | ORAL | 1 refills | Status: DC

## 2021-12-09 MED ORDER — ROSUVASTATIN CALCIUM 20 MG PO TABS: 20.0000 mg | ORAL_TABLET | Freq: Every day | ORAL | 1 refills | Status: DC

## 2021-12-09 MED ORDER — METFORMIN HCL 1000 MG PO TABS: 1000.0000 mg | ORAL_TABLET | Freq: Two times a day (BID) | ORAL | 1 refills | Status: DC

## 2021-12-09 NOTE — Progress Notes (Signed)
Subjective:    Patient ID: Patrick Valentine, male    DOB: Apr 16, 1961, 60 y.o.   MRN: [id number]   Chief Complaint: medical management of chronic issues      HPI:  Patrick Valentine is a 60 y.o. who identifies as a male who was assigned male at birth.   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 Hyperlipidemia associated with type 2 diabetes mellitus ([place]) No c/o chest pain, sob or headaches. He watches his diet somewhat. Lab Results  Component Value Date   CHOL 194 08/26/2021   HDL 41 08/26/2021   LDLCALC 124 (H) 08/26/2021   TRIG 164 (H) 08/26/2021   CHOLHDL 4.7 08/26/2021     2. Diabetes mellitus without complication ([place]) Fasting blood sugars have been up and down. Have been running 180-200. No low blood sugars Lab Results  Component Value Date   HGBA1C 7.2 (H) 08/26/2021      3. BMI 26.0-26.9,adult No ecent weight changes Wt Readings from Last 3 Encounters:  12/09/21 156 lb (70.8 kg)  08/26/21 157 lb (71.2 kg)  05/23/21 160 lb (72.6 kg)   BMI Readings from Last 3 Encounters:  12/09/21 23.72 kg/m  08/26/21 23.87 kg/m  05/23/21 24.33 kg/m     New complaints: None today  No Known Allergies Outpatient Encounter Medications as of 12/09/2021  Medication Sig   ACCU-CHEK SOFTCLIX LANCETS lancets Test BID   Alcohol Swabs (B-D SINGLE USE SWABS REGULAR) PADS Test BID   Blood Glucose Calibration (ACCU-CHEK AVIVA) SOLN Use with glucose meter   Blood Glucose Monitoring Suppl (ACCU-CHEK AVIVA PLUS) w/Device KIT Test BID   celecoxib (CELEBREX) 200 MG capsule Take 1 capsule (200 mg total) by mouth 2 (two) times daily.   Continuous Blood Gluc Receiver (FREESTYLE LIBRE 2 READER) DEVI 1 each by Does not apply route daily.   Continuous Blood Gluc Sensor (FREESTYLE LIBRE 14 DAY SENSOR) MISC 1 each by Does not apply route every 14 (fourteen) days.   dapagliflozin propanediol  (FARXIGA) 10 MG TABS tablet Take 1 tablet (10 mg total) by mouth daily before breakfast.   glucose blood (ACCU-CHEK AVIVA PLUS) test strip Test BID   Lancet Devices (ONE TOUCH DELICA LANCING DEV) MISC Use to check blood sugar daily   Lancets Misc. (ACCU-CHEK FASTCLIX LANCET) KIT Test BID   metFORMIN (GLUCOPHAGE) 1000 MG tablet Take 1 tablet (1,000 mg total) by mouth 2 (two) times daily with a meal.   rosuvastatin (CRESTOR) 20 MG tablet Take 1 tablet (20 mg total) by mouth daily.   TRUEplus Lancets 33G MISC Use to check blood sugar twice daily and prn. Dx Code E11.9   No facility-administered encounter medications on file as of 12/09/2021.    Past Surgical History:  Procedure Laterality Date   NO PAST SURGERIES      Family History  Problem Relation Age of Onset   Diabetes Father 33       IDDM   Diabetes Sister 1       IDDM   Colon cancer Neg Hx    Esophageal cancer Neg Hx    Stomach cancer Neg Hx    Rectal cancer Neg Hx       Controlled substance contract: n/a      Review of Systems  Constitutional:  Negative for diaphoresis.  Eyes:  Negative for pain.  Respiratory:  Negative for shortness of breath.   Cardiovascular:  Negative for chest pain, palpitations and leg swelling.  Gastrointestinal:  Negative for abdominal pain.  Endocrine: Negative for polydipsia.  Skin:  Negative for rash.  Neurological:  Negative for dizziness, weakness and headaches.  Hematological:  Does not bruise/bleed easily.  All other systems reviewed and are negative.     Objective:   Physical Exam Vitals and nursing note reviewed.  Constitutional:      Appearance: Normal appearance. He is well-developed.  HENT:     Head: Normocephalic.     Nose: Nose normal.     Mouth/Throat:     Mouth: Mucous membranes are moist.     Pharynx: Oropharynx is clear.  Eyes:     Pupils: Pupils are equal, round, and reactive to light.  Neck:     Thyroid: No thyroid mass or thyromegaly.     Vascular: No  carotid bruit or JVD.     Trachea: Phonation normal.  Cardiovascular:     Rate and Rhythm: Normal rate and regular rhythm.  Pulmonary:     Effort: Pulmonary effort is normal. No respiratory distress.     Breath sounds: Normal breath sounds.  Abdominal:     General: Bowel sounds are normal.     Palpations: Abdomen is soft.     Tenderness: There is no abdominal tenderness.  Musculoskeletal:        General: Normal range of motion.     Cervical back: Normal range of motion and neck supple.  Lymphadenopathy:     Cervical: No cervical adenopathy.  Skin:    General: Skin is warm and dry.  Neurological:     Mental Status: He is alert and oriented to person, place, and time.  Psychiatric:        Behavior: Behavior normal.        Thought Content: Thought content normal.        Judgment: Judgment normal.    BP 131/85    Pulse 87    Temp 99.1 F (37.3 C) (Temporal)    Resp 20    Ht 5' 8"  (1.727 m)    Wt 156 lb (70.8 kg)    SpO2 97%    BMI 23.72 kg/m   HGBA1c 9.0%      Assessment & Plan:   Patrick Valentine comes in today with chief complaint of Medical Management of Chronic Issues   Diagnosis and orders addressed:  1. Hyperlipidemia associated with type 2 diabetes mellitus (HCC) Low fat diet - Lipid panel - rosuvastatin (CRESTOR) 20 MG tablet; Take 1 tablet (20 mg total) by mouth daily.  Dispense: 90 tablet; Refill: 1  2. Diabetes mellitus without complication ([place]) Strict carb counting Patient does not want to change meds He can get blood sugars down with diet - Bayer DCA Hb A1c Waived - CBC with Differential/Platelet - CMP14+EGFR - dapagliflozin propanediol (FARXIGA) 10 MG TABS tablet; Take 1 tablet (10 mg total) by mouth daily before breakfast.  Dispense: 90 tablet; Refill: 1 - metFORMIN (GLUCOPHAGE) 1000 MG tablet; Take 1 tablet (1,000 mg total) by mouth 2 (two) times daily with a meal.  Dispense: 180 tablet; Refill: 1  3. BMI 26.0-26.9,adult Discussed diet and exercise  for person with BMI >25 Will recheck weight in 3-6 months    Labs pending Health Maintenance reviewed Diet and exercise encouraged  Follow up plan: 3 months   Mary-Margaret Hassell Done, FNP

## 2021-12-09 NOTE — Patient Instructions (Signed)

## 2021-12-10 LAB — CMP14+EGFR
ALT: 24 IU/L (ref 0–44)
AST: 24 IU/L (ref 0–40)
Albumin/Globulin Ratio: 1.7 (ref 1.2–2.2)
Albumin: 4.8 g/dL (ref 3.8–4.8)
Alkaline Phosphatase: 89 IU/L (ref 44–121)
BUN/Creatinine Ratio: 17 (ref 10–24)
BUN: 16 mg/dL (ref 8–27)
Bilirubin Total: 0.3 mg/dL (ref 0.0–1.2)
CO2: 24 mmol/L (ref 20–29)
Calcium: 10.3 mg/dL — ABNORMAL HIGH (ref 8.6–10.2)
Chloride: 95 mmol/L — ABNORMAL LOW (ref 96–106)
Creatinine, Ser: 0.92 mg/dL (ref 0.76–1.27)
Globulin, Total: 2.8 g/dL (ref 1.5–4.5)
Glucose: 207 mg/dL — ABNORMAL HIGH (ref 70–99)
Potassium: 5.3 mmol/L — ABNORMAL HIGH (ref 3.5–5.2)
Sodium: 135 mmol/L (ref 134–144)
Total Protein: 7.6 g/dL (ref 6.0–8.5)
eGFR: 95 mL/min/{1.73_m2} (ref >59)

## 2021-12-10 LAB — CBC WITH DIFFERENTIAL/PLATELET
Basophils Absolute: 0.1 10*3/uL (ref 0.0–0.2)
Basos: 1 %
EOS (ABSOLUTE): 0.1 10*3/uL (ref 0.0–0.4)
Eos: 1 %
Hematocrit: 49.5 % (ref 37.5–51.0)
Hemoglobin: 16.7 g/dL (ref 13.0–17.7)
Immature Grans (Abs): 0 10*3/uL (ref 0.0–0.1)
Immature Granulocytes: 1 %
Lymphocytes Absolute: 2.6 10*3/uL (ref 0.7–3.1)
Lymphs: 29 %
MCH: 30 pg (ref 26.6–33.0)
MCHC: 33.7 g/dL (ref 31.5–35.7)
MCV: 89 fL (ref 79–97)
Monocytes Absolute: 0.6 10*3/uL (ref 0.1–0.9)
Monocytes: 7 %
Neutrophils Absolute: 5.5 10*3/uL (ref 1.4–7.0)
Neutrophils: 61 %
Platelets: 297 10*3/uL (ref 150–450)
RBC: 5.57 x10E6/uL (ref 4.14–5.80)
RDW: 13 % (ref 11.6–15.4)
WBC: 8.9 10*3/uL (ref 3.4–10.8)

## 2021-12-10 LAB — LIPID PANEL
Chol/HDL Ratio: 5.2 ratio — ABNORMAL HIGH (ref 0.0–5.0)
Cholesterol, Total: 203 mg/dL — ABNORMAL HIGH (ref 100–199)
HDL: 39 mg/dL — ABNORMAL LOW (ref >39)
LDL Chol Calc (NIH): 138 mg/dL — ABNORMAL HIGH (ref 0–99)
Triglycerides: 145 mg/dL (ref 0–149)
VLDL Cholesterol Cal: 26 mg/dL (ref 5–40)

## 2021-12-23 DIAGNOSIS — H5362 Acquired night blindness: Secondary | ICD-10-CM | POA: Diagnosis not present

## 2021-12-23 DIAGNOSIS — H4710 Unspecified papilledema: Secondary | ICD-10-CM | POA: Diagnosis not present

## 2021-12-23 DIAGNOSIS — D8989 Other specified disorders involving the immune mechanism, not elsewhere classified: Secondary | ICD-10-CM | POA: Diagnosis not present

## 2021-12-23 DIAGNOSIS — Z83518 Family history of other specified eye disorder: Secondary | ICD-10-CM | POA: Diagnosis not present

## 2021-12-23 DIAGNOSIS — H53453 Other localized visual field defect, bilateral: Secondary | ICD-10-CM | POA: Diagnosis not present

## 2021-12-23 DIAGNOSIS — H3500 Unspecified background retinopathy: Secondary | ICD-10-CM | POA: Diagnosis not present

## 2021-12-23 DIAGNOSIS — H3554 Dystrophies primarily involving the retinal pigment epithelium: Secondary | ICD-10-CM | POA: Diagnosis not present

## 2021-12-23 DIAGNOSIS — Q8789 Other specified congenital malformation syndromes, not elsewhere classified: Secondary | ICD-10-CM | POA: Diagnosis not present

## 2021-12-23 DIAGNOSIS — H469 Unspecified optic neuritis: Secondary | ICD-10-CM | POA: Diagnosis not present

## 2021-12-23 DIAGNOSIS — H903 Sensorineural hearing loss, bilateral: Secondary | ICD-10-CM | POA: Diagnosis not present

## 2021-12-23 DIAGNOSIS — H543 Unqualified visual loss, both eyes: Secondary | ICD-10-CM | POA: Diagnosis not present

## 2021-12-23 LAB — HM DIABETES EYE EXAM

## 2021-12-26 DIAGNOSIS — H903 Sensorineural hearing loss, bilateral: Secondary | ICD-10-CM | POA: Diagnosis not present

## 2022-01-01 ENCOUNTER — Telehealth: Payer: Self-pay | Admitting: Pharmacist

## 2022-01-01 DIAGNOSIS — E119 Type 2 diabetes mellitus without complications: Secondary | ICD-10-CM

## 2022-01-01 MED ORDER — DAPAGLIFLOZIN PROPANEDIOL 10 MG PO TABS: 10.0000 mg | ORAL_TABLET | Freq: Every day | ORAL | 5 refills | Status: DC

## 2022-01-01 NOTE — Telephone Encounter (Signed)
Wilder Glade refill sent to [facility] (pharmacy for [facility] patient assistance)

## 2022-01-02 ENCOUNTER — Other Ambulatory Visit: Payer: Self-pay

## 2022-01-02 MED ORDER — BD SWAB SINGLE USE REGULAR PADS: MEDICATED_PAD | 3 refills | Status: DC

## 2022-01-02 MED ORDER — ACCU-CHEK AVIVA VI SOLN: 0 refills | Status: DC

## 2022-01-02 MED ORDER — ACCU-CHEK AVIVA PLUS W/DEVICE KIT: PACK | 0 refills | Status: DC

## 2022-01-02 MED ORDER — ACCU-CHEK AVIVA PLUS VI STRP: ORAL_STRIP | 12 refills | Status: DC

## 2022-01-06 ENCOUNTER — Ambulatory Visit (INDEPENDENT_AMBULATORY_CARE_PROVIDER_SITE_OTHER): Payer: Medicare HMO

## 2022-01-06 VITALS — Ht 68.0 in | Wt 156.0 lb

## 2022-01-06 DIAGNOSIS — Z0000 Encounter for general adult medical examination without abnormal findings: Secondary | ICD-10-CM | POA: Diagnosis not present

## 2022-01-06 NOTE — Progress Notes (Signed)
Subjective:   Patrick Valentine is a 61 y.o. male who presents for Medicare Annual/Subsequent preventive examination.  Virtual Visit via Telephone Note  I connected with  Patrick Valentine on 01/06/22 at  9:45 AM EST by telephone and verified that I am speaking with the correct person using two identifiers.  Location: Patient: Home Provider: [facility] Persons participating in the virtual visit: patient/Nurse Health Advisor   I discussed the limitations, risks, security and privacy concerns of performing an evaluation and management service by telephone and the availability of in person appointments. The patient expressed understanding and agreed to proceed.  Interactive audio and video telecommunications were attempted between this nurse and patient, however failed, due to patient having technical difficulties OR patient did not have access to video capability.  We continued and completed visit with audio only.  Some vital signs may be absent or patient reported.   Patrick Coakley E Annalaya Wile, LPN   Review of Systems     Cardiac Risk Factors include: advanced age (>66mn, >>70women);male gender;diabetes mellitus;dyslipidemia     Objective:    Today's Vitals   01/06/22 0948  Weight: 156 lb (70.8 kg)  Height: 5' 8"  (1.727 m)   Body mass index is 23.72 kg/m.  Advanced Directives 01/06/2022 10/11/2017 09/24/2014 09/10/2014  Does Patient Have a Medical Advance Directive? No Yes No No  Type of Advance Directive - Healthcare Power of [place] - -  Does patient want to make changes to medical advance directive? - No - Patient declined - -  Copy of [place]? - No - copy requested - -  Would patient like information on creating a medical advance directive? No - Patient declined - - -    Current Medications (verified) Outpatient Encounter Medications as of 01/06/2022  Medication Sig   ACCU-CHEK SOFTCLIX LANCETS lancets Test BID   Acetylcysteine 600 MG CAPS Take  by mouth.   Alcohol Swabs (B-D SINGLE USE SWABS REGULAR) PADS Test BID   Blood Glucose Calibration (ACCU-CHEK AVIVA) SOLN Use with glucose meter   Blood Glucose Monitoring Suppl (ACCU-CHEK AVIVA PLUS) w/Device KIT Test BID   celecoxib (CELEBREX) 200 MG capsule Take 1 capsule (200 mg total) by mouth 2 (two) times daily.   Continuous Blood Gluc Receiver (FREESTYLE LIBRE 2 READER) DEVI 1 each by Does not apply route daily.   Continuous Blood Gluc Sensor (FREESTYLE LIBRE 14 DAY SENSOR) MISC 1 each by Does not apply route every 14 (fourteen) days.   dapagliflozin propanediol (FARXIGA) 10 MG TABS tablet Take 1 tablet (10 mg total) by mouth daily before breakfast.   glucose blood (ACCU-CHEK AVIVA PLUS) test strip Test BID   Lancet Devices (ONE TOUCH DELICA LANCING DEV) MISC Use to check blood sugar daily   Lancets Misc. (ACCU-CHEK FASTCLIX LANCET) KIT Test BID   metFORMIN (GLUCOPHAGE) 1000 MG tablet Take 1 tablet (1,000 mg total) by mouth 2 (two) times daily with a meal.   rosuvastatin (CRESTOR) 20 MG tablet Take 1 tablet (20 mg total) by mouth daily.   TRUEplus Lancets 33G MISC Use to check blood sugar twice daily and prn. Dx Code E11.9   No facility-administered encounter medications on file as of 01/06/2022.    Allergies (verified) Patient has no known allergies.   History: Past Medical History:  Diagnosis Date   Diabetes mellitus without complication ([place]    Hyperlipidemia    Past Surgical History:  Procedure Laterality Date   NO PAST SURGERIES  Family History  Problem Relation Age of Onset   Diabetes Father 55       IDDM   Diabetes Sister 54       IDDM   Colon cancer Neg Hx    Esophageal cancer Neg Hx    Stomach cancer Neg Hx    Rectal cancer Neg Hx    Social History   Socioeconomic History   Marital status: Married    Spouse name: Not on file   Number of children: 2   Years of education: Not on file   Highest education level: Not on file  Occupational History    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  Tobacco Use   Smoking status: Never   Smokeless tobacco: Never  Vaping Use   Vaping Use: Never used  Substance and Sexual Activity   Alcohol use: No   Drug use: No   Sexual activity: Yes  Other Topics Concern   Not on file  Social History Narrative   2 sons - 6 grandchildren from them   One step - daughter who has 5 children - they all call him papa   Social Determinants of Health   Financial Resource Strain: Low Risk    Difficulty of Paying Living Expenses: Not hard at all  Food Insecurity: No Food Insecurity   Worried About Charity fundraiser in the Last Year: Never true   Ran Out of Food in the Last Year: Never true  Transportation Needs: No Transportation Needs   Lack of Transportation (Medical): No   Lack of Transportation (Non-Medical): No  Physical Activity: Sufficiently Active   Days of Exercise per Week: 7 days   Minutes of Exercise per Session: 60 min  Stress: No Stress Concern Present   Feeling of Stress : Not at all  Social Connections: Socially Integrated   Frequency of Communication with Friends and Family: More than three times a week   Frequency of Social Gatherings with Friends and Family: More than three times a week   Attends Religious Services: More than 4 times per year   Active Member of [employer] or Organizations: Yes   Attends Music therapist: More than 4 times per year   Marital Status: Married    Tobacco Counseling Counseling given: Not Answered   Clinical Intake:  Pre-visit preparation completed: Yes  Pain : No/denies pain     BMI - recorded: 23.72 Nutritional Status: BMI of 19-24  Normal Nutritional Risks: None Diabetes: Yes CBG done?: No Did pt. bring in CBG monitor from home?: No  How often do you need to have someone help you when you read instructions, pamphlets, or other written materials from your doctor or pharmacy?: 1 - Never  Diabetic? Nutrition Risk Assessment:  Has  the patient had any N/V/D within the last 2 months?  No  Does the patient have any non-healing wounds?  No  Has the patient had any unintentional weight loss or weight gain?  No   Diabetes:  Is the patient diabetic?  Yes  If diabetic, was a CBG obtained today?  No  Did the patient bring in their glucometer from home?  No  How often do you monitor your CBG's? CGM - frequently.   Financial Strains and Diabetes Management:  Are you having any financial strains with the device, your supplies or your medication? No .  Does the patient want to be seen by Chronic Care Management for management of their diabetes?  No  Would the patient  like to be referred to a Nutritionist or for Diabetic Management?  No   Diabetic Exams:  Diabetic Eye Exam: Completed 11/2021.   Diabetic Foot Exam: Completed 12/09/2021. Pt has been advised about the importance in completing this exam. Pt is scheduled for diabetic foot exam on 11/2022.    Interpreter Needed?: No  Information entered by :: Zabella Wease, LPN   Activities of Daily Living In your present state of health, do you have any difficulty performing the following activities: 01/06/2022  Hearing? Y  Comment wears hearing aids  Vision? Y  Comment blind - routine eye exams with [facility]  Difficulty concentrating or making decisions? N  Walking or climbing stairs? N  Dressing or bathing? N  Doing errands, shopping? Y  Comment blind - unable to drive  Preparing Food and eating ? N  Using the Toilet? N  In the past six months, have you accidently leaked urine? N  Do you have problems with loss of bowel control? N  Managing your Medications? N  Managing your Finances? N  Housekeeping or managing your Housekeeping? N  Some recent data might be hidden    Patient Care Team: Chevis Pretty, FNP as PCP - General (Nurse Practitioner) Hilarie Fredrickson, Lajuan Lines, MD as Consulting Physician (Gastroenterology) Lavera Guise, [facility] (Pharmacist) Peter Congo, MD as Referring Physician (Ophthalmology) Alexis Frock, MD as Consulting Physician (Urology) Pyrtle, Lajuan Lines, MD as Consulting Physician (Gastroenterology)  Indicate any recent Medical Services you may have received from other than [facility] providers in the past year (date may be approximate).     Assessment:   This is a routine wellness examination for [place].  Hearing/Vision screen Hearing Screening - Comments:: Wears hearing aids - from [place] - Comments:: Up to date with annual eye exams with [facility] - very limited vision  Dietary issues and exercise activities discussed: Current Exercise Habits: Home exercise routine, Type of exercise: treadmill;strength training/weights;stretching, Time (Minutes): 60, Frequency (Times/Week): 7, Weekly Exercise (Minutes/Week): 420, Intensity: Intense, Exercise limited by: None identified   Goals Addressed             This Visit's Progress    Exercise 150 minutes per week (moderate activity)   On track      Depression Screen PHQ 2/9 Scores 01/06/2022 12/09/2021 08/26/2021 05/23/2021 04/02/2021 02/05/2021 11/05/2020  PHQ - 2 Score 0 0 0 0 0 0 0  PHQ- 9 Score 0 0 0 0 - - -    Fall Risk Fall Risk  01/06/2022 12/09/2021 08/26/2021 05/23/2021 04/02/2021  Falls in the past year? 0 0 0 0 0  Number falls in past yr: 0 - - - -  Injury with Fall? 0 - - - -  Risk for fall due to : Impaired vision - - - -  Follow up Falls prevention discussed - - - -    FALL RISK PREVENTION PERTAINING TO THE HOME:  Any stairs in or around the home? Yes  If so, are there any without handrails? No  Home free of loose throw rugs in walkways, pet beds, electrical cords, etc? Yes  Adequate lighting in your home to reduce risk of falls? Yes   ASSISTIVE DEVICES UTILIZED TO PREVENT FALLS:  Life alert? No  Use of a cane, walker or w/c? No  Grab bars in the bathroom? No  Shower chair or bench in shower? No  Elevated toilet  seat or a handicapped toilet? No   TIMED UP AND  GO:  Was the test performed? No . Telephonic visit  Cognitive Function: Normal cognitive status assessed by direct observation by this Nurse Health Advisor. No abnormalities found.   MMSE - Mini Mental State Exam 10/11/2017 10/11/2017  Orientation to time 5 5  Orientation to Place 5 5  Registration 3 3  Attention/ Calculation 5 5  Recall 3 3  Language- name 2 objects 2 2  Language- repeat 1 1  Language- follow 3 step command 3 3  Language- read & follow direction 1 1  Write a sentence 1 1  Copy design 1 -  Total score 30 -        Immunizations Immunization History  Administered Date(s) Administered   Influenza,inj,Quad PF,6+ Mos 10/25/2014, 09/13/2015, 09/08/2016, 09/21/2017, 09/21/2018, 08/29/2019, 10/08/2020, 10/17/2021   Moderna SARS-COV2 Booster Vaccination 07/24/2021   Moderna Sars-Covid-2 Vaccination 02/22/2020, 03/21/2020, 11/14/2020   Pneumococcal Conjugate-13 03/18/2017   Pneumococcal Polysaccharide-23 10/25/2014   Td 12/11/2009   Tdap 12/11/2009, 11/29/2015    TDAP status: Up to date  Flu Vaccine status: Up to date  Pneumococcal vaccine status: Up to date  Covid-19 vaccine status: Completed vaccines  Qualifies for Shingles Vaccine? No   Zostavax completed No    Screening Tests Health Maintenance  Topic Date Due   COVID-19 Vaccine (4 - Booster for Moderna series) 09/18/2021   URINE MICROALBUMIN  02/05/2022   HIV Screening  12/09/2022 (Originally 08/27/1976)   HEMOGLOBIN A1C  06/09/2022   FOOT EXAM  12/09/2022   OPHTHALMOLOGY EXAM  12/23/2022   TETANUS/TDAP  11/28/2025   COLONOSCOPY (Pts 45-41yr Insurance coverage will need to be confirmed)  06/13/2027   INFLUENZA VACCINE  Completed   Hepatitis C Screening  Completed   HPV VACCINES  Aged Out   Zoster Vaccines- Shingrix  Discontinued    Health Maintenance  Health Maintenance Due  Topic Date Due   COVID-19 Vaccine (4 - Booster for Moderna  series) 09/18/2021   URINE MICROALBUMIN  02/05/2022    Colorectal cancer screening: Type of screening: Colonoscopy. Completed 06/12/2020. Repeat every 7 years  Lung Cancer Screening: (Low Dose CT Chest recommended if Age 62-80years, 30 pack-year currently smoking OR have quit w/in 15years.) does not qualify.   Additional Screening:  Hepatitis C Screening: does qualify; Completed 02/27/2016  Vision Screening: Recommended annual ophthalmology exams for early detection of glaucoma and other disorders of the eye. Is the patient up to date with their annual eye exam?  Yes  Who is the provider or what is the name of the office in which the patient attends annual eye exams? [facility] If pt is not established with a provider, would they like to be referred to a provider to establish care? No .   Dental Screening: Recommended annual dental exams for proper oral hygiene  Community Resource Referral / Chronic Care Management: CRR required this visit?  No   CCM required this visit?  No      Plan:     I have personally reviewed and noted the following in the patients chart:   Medical and social history Use of alcohol, tobacco or illicit drugs  Current medications and supplements including opioid prescriptions. Patient is not currently taking opioid prescriptions. Functional ability and status Nutritional status Physical activity Advanced directives List of other physicians Hospitalizations, surgeries, and ER visits in previous 12 months Vitals Screenings to include cognitive, depression, and falls Referrals and appointments  In addition, I have reviewed and discussed with patient certain preventive protocols, quality metrics,  and best practice recommendations. A written personalized care plan for preventive services as well as general preventive health recommendations were provided to patient.     Sandrea Hammond, LPN   [id number]   Nurse Notes: none

## 2022-01-06 NOTE — Patient Instructions (Signed)
Mr. Patrick Valentine , Thank you for taking time to come for your Medicare Wellness Visit. I appreciate your ongoing commitment to your health goals. Please review the following plan we discussed and let me know if I can assist you in the future.   Screening recommendations/referrals: Colonoscopy: Done 06/12/2020 - repeat in 7 years Recommended yearly ophthalmology/optometry visit for glaucoma screening and checkup Recommended yearly dental visit for hygiene and checkup  Vaccinations: Influenza vaccine: Done 10/17/2021 - Repeat annually  Pneumococcal vaccine: Done 10/25/2014 & 03/18/2017 Tdap vaccine: Done 11/29/2015 - Repeat in 10 years Shingles vaccine: Not recommended   Covid-19: Done 02/22/2020, 03/21/2020, 11/14/2020, & 07/24/2021  Advanced directives: Advance directive discussed with you today. Even though you declined this today, please call our office should you change your mind, and we can give you the proper paperwork for you to fill out.   Conditions/risks identified: Keep up the great work!   Next appointment: Follow up in one year for your annual wellness visit   Preventive Care 40-64 Years, Male Preventive care refers to lifestyle choices and visits with your health care provider that can promote health and wellness. What does preventive care include? A yearly physical exam. This is also called an annual well check. Dental exams once or twice a year. Routine eye exams. Ask your health care provider how often you should have your eyes checked. Personal lifestyle choices, including: Daily care of your teeth and gums. Regular physical activity. Eating a healthy diet. Avoiding tobacco and drug use. Limiting alcohol use. Practicing safe sex. Taking low-dose aspirin every day starting at age 62. What happens during an annual well check? The services and screenings done by your health care provider during your annual well check will depend on your age, overall health, lifestyle risk  factors, and family history of disease. Counseling  Your health care provider may ask you questions about your: Alcohol use. Tobacco use. Drug use. Emotional well-being. Home and relationship well-being. Sexual activity. Eating habits. Work and work Statistician. Screening  You may have the following tests or measurements: Height, weight, and BMI. Blood pressure. Lipid and cholesterol levels. These may be checked every 5 years, or more frequently if you are over 35 years old. Skin check. Lung cancer screening. You may have this screening every year starting at age 40 if you have a 30-pack-year history of smoking and currently smoke or have quit within the past 15 years. Fecal occult blood test (FOBT) of the stool. You may have this test every year starting at age 33. Flexible sigmoidoscopy or colonoscopy. You may have a sigmoidoscopy every 5 years or a colonoscopy every 10 years starting at age 28. Prostate cancer screening. Recommendations will vary depending on your family history and other risks. Hepatitis C blood test. Hepatitis B blood test. Sexually transmitted disease (STD) testing. Diabetes screening. This is done by checking your blood sugar (glucose) after you have not eaten for a while (fasting). You may have this done every 1-3 years. Discuss your test results, treatment options, and if necessary, the need for more tests with your health care provider. Vaccines  Your health care provider may recommend certain vaccines, such as: Influenza vaccine. This is recommended every year. Tetanus, diphtheria, and acellular pertussis (Tdap, Td) vaccine. You may need a Td booster every 10 years. Zoster vaccine. You may need this after age 22. Pneumococcal 13-valent conjugate (PCV13) vaccine. You may need this if you have certain conditions and have not been vaccinated. Pneumococcal polysaccharide (PPSV23) vaccine. You  may need one or two doses if you smoke cigarettes or if you have  certain conditions. Talk to your health care provider about which screenings and vaccines you need and how often you need them. This information is not intended to replace advice given to you by your health care provider. Make sure you discuss any questions you have with your health care provider. Document Released: 12/27/2015 Document Revised: 08/19/2016 Document Reviewed: 10/01/2015 Elsevier Interactive Patient Education  2017 [place] Prevention in the Home Falls can cause injuries. They can happen to people of all ages. There are many things you can do to make your home safe and to help prevent falls. What can I do on the outside of my home? Regularly fix the edges of walkways and driveways and fix any cracks. Remove anything that might make you trip as you walk through a door, such as a raised step or threshold. Trim any bushes or trees on the path to your home. Use bright outdoor lighting. Clear any walking paths of anything that might make someone trip, such as rocks or tools. Regularly check to see if handrails are loose or broken. Make sure that both sides of any steps have handrails. Any raised decks and porches should have guardrails on the edges. Have any leaves, snow, or ice cleared regularly. Use sand or salt on walking paths during winter. Clean up any spills in your garage right away. This includes oil or grease spills. What can I do in the bathroom? Use night lights. Install grab bars by the toilet and in the tub and shower. Do not use towel bars as grab bars. Use non-skid mats or decals in the tub or shower. If you need to sit down in the shower, use a plastic, non-slip stool. Keep the floor dry. Clean up any water that spills on the floor as soon as it happens. Remove soap buildup in the tub or shower regularly. Attach bath mats securely with double-sided non-slip rug tape. Do not have throw rugs and other things on the floor that can make you trip. What can  I do in the bedroom? Use night lights. Make sure that you have a light by your bed that is easy to reach. Do not use any sheets or blankets that are too big for your bed. They should not hang down onto the floor. Have a firm chair that has side arms. You can use this for support while you get dressed. Do not have throw rugs and other things on the floor that can make you trip. What can I do in the kitchen? Clean up any spills right away. Avoid walking on wet floors. Keep items that you use a lot in easy-to-reach places. If you need to reach something above you, use a strong step stool that has a grab bar. Keep electrical cords out of the way. Do not use floor polish or wax that makes floors slippery. If you must use wax, use non-skid floor wax. Do not have throw rugs and other things on the floor that can make you trip. What can I do with my stairs? Do not leave any items on the stairs. Make sure that there are handrails on both sides of the stairs and use them. Fix handrails that are broken or loose. Make sure that handrails are as long as the stairways. Check any carpeting to make sure that it is firmly attached to the stairs. Fix any carpet that is loose or worn. Avoid  having throw rugs at the top or bottom of the stairs. If you do have throw rugs, attach them to the floor with carpet tape. Make sure that you have a light switch at the top of the stairs and the bottom of the stairs. If you do not have them, ask someone to add them for you. What else can I do to help prevent falls? Wear shoes that: Do not have high heels. Have rubber bottoms. Are comfortable and fit you well. Are closed at the toe. Do not wear sandals. If you use a stepladder: Make sure that it is fully opened. Do not climb a closed stepladder. Make sure that both sides of the stepladder are locked into place. Ask someone to hold it for you, if possible. Clearly mark and make sure that you can see: Any grab bars or  handrails. First and last steps. Where the edge of each step is. Use tools that help you move around (mobility aids) if they are needed. These include: Canes. Walkers. Scooters. Crutches. Turn on the lights when you go into a dark area. Replace any light bulbs as soon as they burn out. Set up your furniture so you have a clear path. Avoid moving your furniture around. If any of your floors are uneven, fix them. If there are any pets around you, be aware of where they are. Review your medicines with your doctor. Some medicines can make you feel dizzy. This can increase your chance of falling. Ask your doctor what other things that you can do to help prevent falls. This information is not intended to replace advice given to you by your health care provider. Make sure you discuss any questions you have with your health care provider. Document Released: 09/26/2009 Document Revised: 05/07/2016 Document Reviewed: 01/04/2015 Elsevier Interactive Patient Education  2017 [employer].

## 2022-01-27 IMAGING — DX DG KNEE 1-2V*R*
2 series · 2 of 2 positions shown · non-contrast
Comparison: None.

CLINICAL DATA: Knee pain.

EXAM:
RIGHT KNEE - 1-2 VIEW

[knee ap]
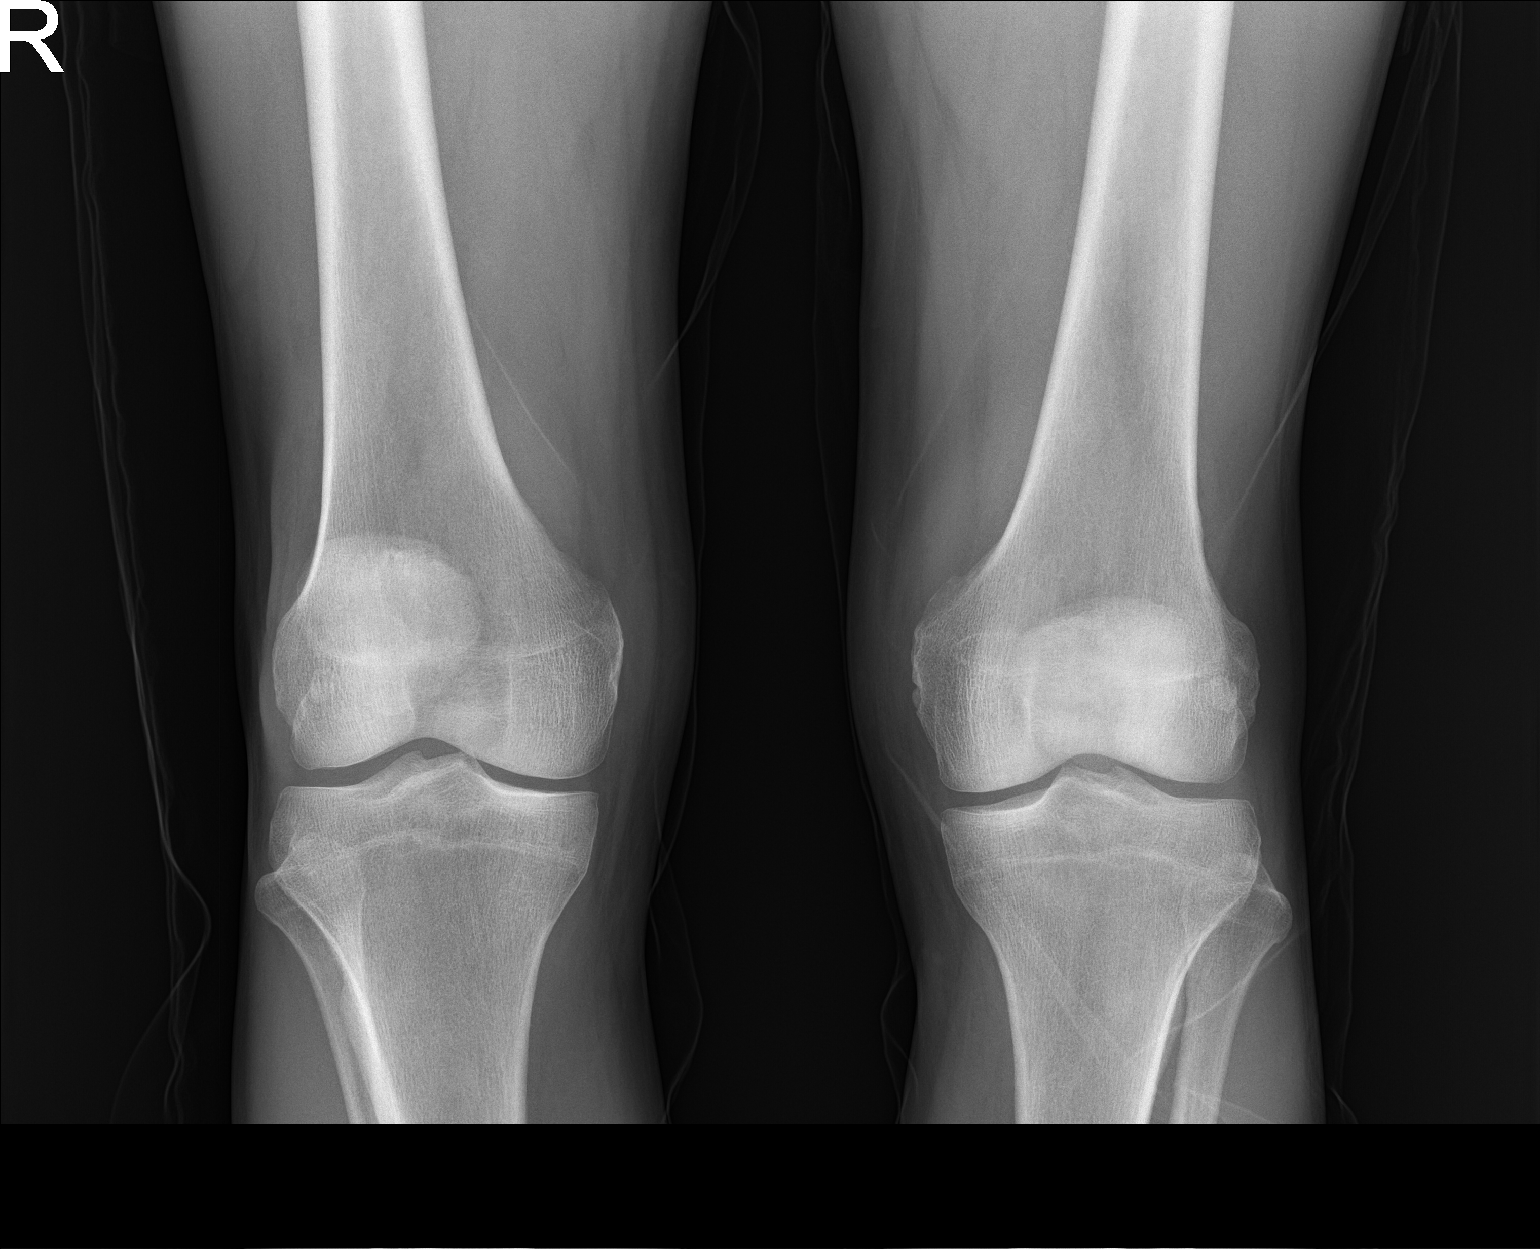

[knee lat]
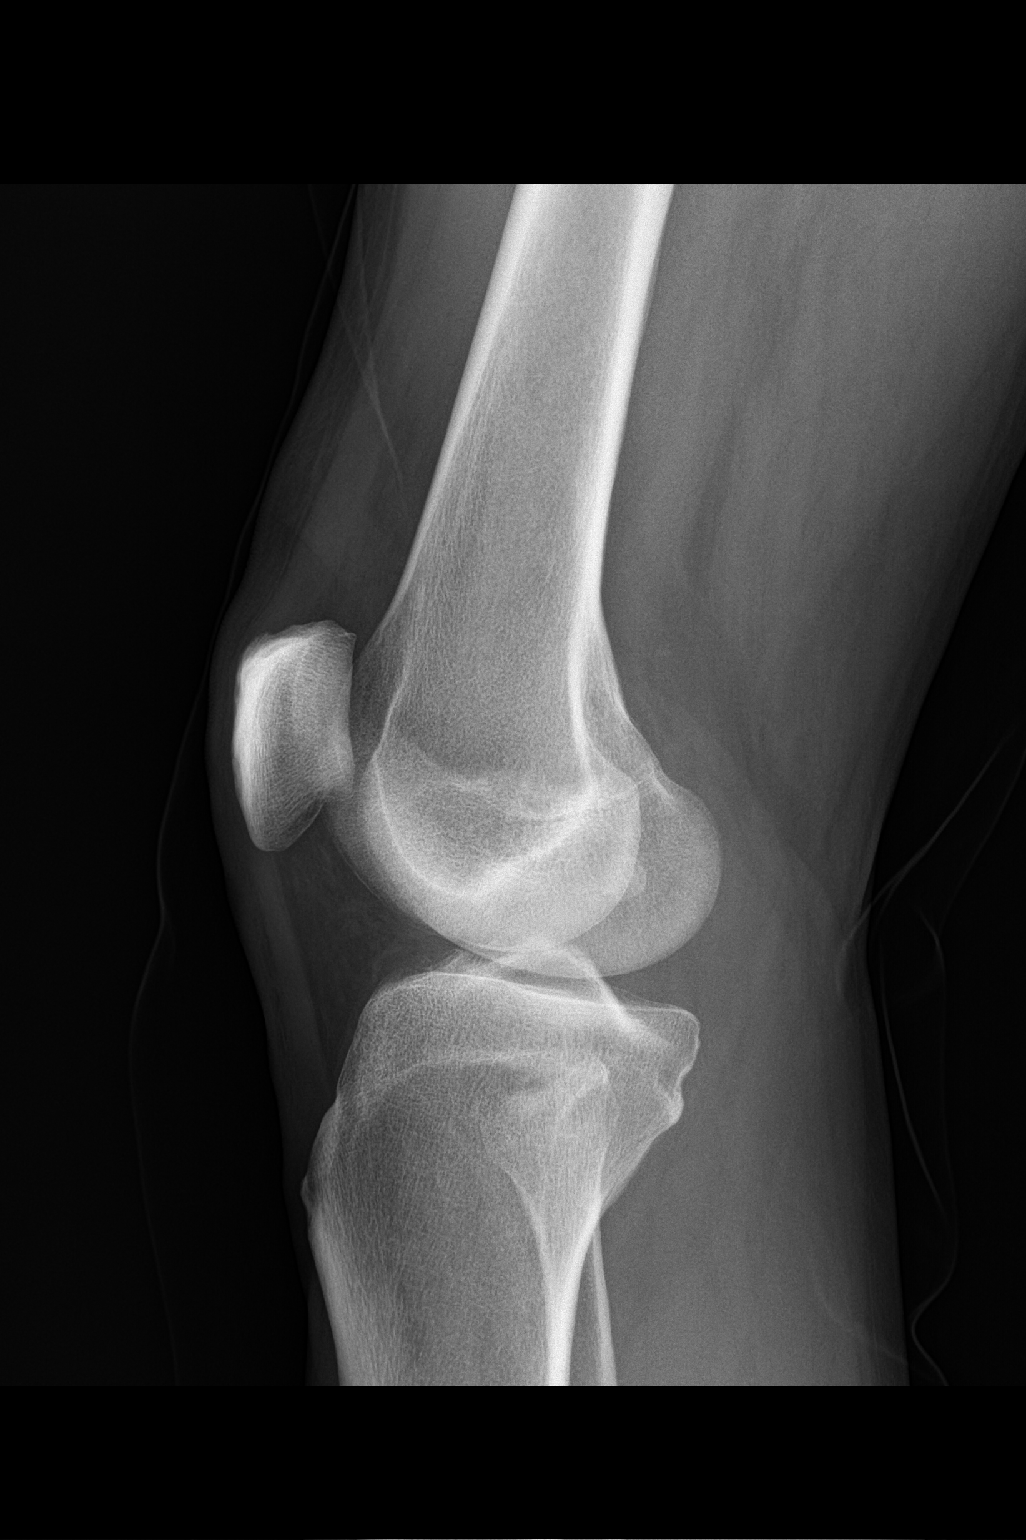

[2 of 2 positions shown; findings below may reference images not displayed]

FINDINGS: No evidence of fracture, dislocation, or joint effusion. No evidence
of arthropathy or other focal bone abnormality. Soft tissues are
unremarkable.
IMPRESSION: Negative.

## 2022-03-10 ENCOUNTER — Ambulatory Visit (INDEPENDENT_AMBULATORY_CARE_PROVIDER_SITE_OTHER): Payer: Medicare HMO | Admitting: Nurse Practitioner

## 2022-03-10 ENCOUNTER — Encounter: Payer: Self-pay | Admitting: Nurse Practitioner

## 2022-03-10 VITALS — BP 130/85 | HR 84 | Temp 97.8°F | Resp 20 | Ht 68.0 in | Wt 160.0 lb

## 2022-03-10 DIAGNOSIS — E119 Type 2 diabetes mellitus without complications: Secondary | ICD-10-CM

## 2022-03-10 DIAGNOSIS — E1169 Type 2 diabetes mellitus with other specified complication: Secondary | ICD-10-CM

## 2022-03-10 DIAGNOSIS — E785 Hyperlipidemia, unspecified: Secondary | ICD-10-CM

## 2022-03-10 DIAGNOSIS — Z6826 Body mass index (BMI) 26.0-26.9, adult: Secondary | ICD-10-CM

## 2022-03-10 LAB — BAYER DCA HB A1C WAIVED: HB A1C (BAYER DCA - WAIVED): 8.7 % — ABNORMAL HIGH (ref 4.8–5.6)

## 2022-03-10 MED ORDER — DAPAGLIFLOZIN PROPANEDIOL 10 MG PO TABS: 10.0000 mg | ORAL_TABLET | Freq: Every day | ORAL | 5 refills | Status: DC

## 2022-03-10 MED ORDER — METFORMIN HCL 1000 MG PO TABS: 1000.0000 mg | ORAL_TABLET | Freq: Two times a day (BID) | ORAL | 1 refills | Status: DC

## 2022-03-10 MED ORDER — ROSUVASTATIN CALCIUM 20 MG PO TABS: 20.0000 mg | ORAL_TABLET | Freq: Every day | ORAL | 1 refills | Status: DC

## 2022-03-10 NOTE — Progress Notes (Signed)
? ?Subjective:  ? ? Patient ID: Patrick Valentine, male    DOB: 15-Jul-1961, 61 y.o.   MRN: [id number] ? ? ?Chief Complaint: medical management of chronic issues  ?  ? ?HPI: ? ?Patrick Valentine is a 61 y.o. who identifies as a male who was assigned male at birth.  ? ?Social history: ?Lives with: wife ?Work history: retired ? ? ?Comes in today for follow up of the following chronic medical issues: ? ?1. Hyperlipidemia associated with type 2 diabetes mellitus ([place]) ?Does try to watch diet and has been trying to get back to exercising regularly. ?Lab Results  ?Component Value Date  ? CHOL 203 (H) 12/09/2021  ? HDL 39 (L) 12/09/2021  ? LDLCALC 138 (H) 12/09/2021  ? TRIG 145 12/09/2021  ? CHOLHDL 5.2 (H) 12/09/2021  ? ?The 10-year ASCVD risk score (Arnett DK, et al., 2019) is: 21.3% ? ? ?2. Diabetes mellitus without complication ([place]) ?He does not check his blood sugar daily. Last tim he checkd it iwas 160. No low blood sugars. ?Lab Results  ?Component Value Date  ? HGBA1C 9.0 (H) 12/09/2021  ? ? ?3. BMI 26.0-26.9,adult ?No recent weight changes ?Wt Readings from Last 3 Encounters:  ?03/10/22 160 lb (72.6 kg)  ?01/06/22 156 lb (70.8 kg)  ?12/09/21 156 lb (70.8 kg)  ? ?BMI Readings from Last 3 Encounters:  ?03/10/22 24.33 kg/m?  ?01/06/22 23.72 kg/m?  ?12/09/21 23.72 kg/m?  ? ? ? ? ?New complaints: ?None today ? ?No Known Allergies ?Outpatient Encounter Medications as of 03/10/2022  ?Medication Sig  ? ACCU-CHEK SOFTCLIX LANCETS lancets Test BID  ? Acetylcysteine 600 MG CAPS Take by mouth.  ? Alcohol Swabs (B-D SINGLE USE SWABS REGULAR) PADS Test BID  ? Blood Glucose Calibration (ACCU-CHEK AVIVA) SOLN Use with glucose meter  ? Blood Glucose Monitoring Suppl (ACCU-CHEK AVIVA PLUS) w/Device KIT Test BID  ? celecoxib (CELEBREX) 200 MG capsule Take 1 capsule (200 mg total) by mouth 2 (two) times daily.  ? Continuous Blood Gluc Receiver (FREESTYLE LIBRE 2 READER) DEVI 1 each by Does not apply route daily.  ? Continuous Blood Gluc  Sensor (FREESTYLE LIBRE 14 DAY SENSOR) MISC 1 each by Does not apply route every 14 (fourteen) days.  ? dapagliflozin propanediol (FARXIGA) 10 MG TABS tablet Take 1 tablet (10 mg total) by mouth daily before breakfast.  ? glucose blood (ACCU-CHEK AVIVA PLUS) test strip Test BID  ? Lancet Devices (ONE TOUCH DELICA LANCING DEV) MISC Use to check blood sugar daily  ? Lancets Misc. (ACCU-CHEK FASTCLIX LANCET) KIT Test BID  ? metFORMIN (GLUCOPHAGE) 1000 MG tablet Take 1 tablet (1,000 mg total) by mouth 2 (two) times daily with a meal.  ? rosuvastatin (CRESTOR) 20 MG tablet Take 1 tablet (20 mg total) by mouth daily.  ? TRUEplus Lancets 33G MISC Use to check blood sugar twice daily and prn. Dx Code E11.9  ? ?No facility-administered encounter medications on file as of 03/10/2022.  ? ? ?Past Surgical History:  ?Procedure Laterality Date  ? NO PAST SURGERIES    ? ? ?Family History  ?Problem Relation Age of Onset  ? Diabetes Father 18  ?     IDDM  ? Diabetes Sister 71  ?     IDDM  ? Colon cancer Neg Hx   ? Esophageal cancer Neg Hx   ? Stomach cancer Neg Hx   ? Rectal cancer Neg Hx   ? ? ? ? ?Controlled substance contract: n/a ? ? ? ? ?Review  of Systems  ?Constitutional:  Negative for diaphoresis.  ?Eyes:  Negative for pain.  ?Respiratory:  Negative for shortness of breath.   ?Cardiovascular:  Negative for chest pain, palpitations and leg swelling.  ?Gastrointestinal:  Negative for abdominal pain.  ?Endocrine: Negative for polydipsia.  ?Skin:  Negative for rash.  ?Neurological:  Negative for dizziness, weakness and headaches.  ?Hematological:  Does not bruise/bleed easily.  ?All other systems reviewed and are negative. ? ?   ?Objective:  ? Physical Exam ?Vitals and nursing note reviewed.  ?Constitutional:   ?   Appearance: Normal appearance. He is well-developed.  ?HENT:  ?   Head: Normocephalic.  ?   Nose: Nose normal.  ?   Mouth/Throat:  ?   Mouth: Mucous membranes are moist.  ?   Pharynx: Oropharynx is clear.  ?Eyes:  ?    Pupils: Pupils are equal, round, and reactive to light.  ?Neck:  ?   Thyroid: No thyroid mass or thyromegaly.  ?   Vascular: No carotid bruit or JVD.  ?   Trachea: Phonation normal.  ?Cardiovascular:  ?   Rate and Rhythm: Normal rate and regular rhythm.  ?Pulmonary:  ?   Effort: Pulmonary effort is normal. No respiratory distress.  ?   Breath sounds: Normal breath sounds.  ?Abdominal:  ?   General: Bowel sounds are normal.  ?   Palpations: Abdomen is soft.  ?   Tenderness: There is no abdominal tenderness.  ?Musculoskeletal:     ?   General: Normal range of motion.  ?   Cervical back: Normal range of motion and neck supple.  ?Lymphadenopathy:  ?   Cervical: No cervical adenopathy.  ?Skin: ?   General: Skin is warm and dry.  ?Neurological:  ?   Mental Status: He is alert and oriented to person, place, and time.  ?Psychiatric:     ?   Behavior: Behavior normal.     ?   Thought Content: Thought content normal.     ?   Judgment: Judgment normal.  ? ? ?BP 130/85   Pulse 84   Temp 97.8 ?F (36.6 ?C) (Temporal)   Resp 20   Ht 5' 8"  (1.727 m)   Wt 160 lb (72.6 kg)   SpO2 99%   BMI 24.33 kg/m?  ? ? ? ?   ?Assessment & Plan:  ?Patrick Valentine comes in today with chief complaint of Medical Management of Chronic Issues ? ? ?Diagnosis and orders addressed: ? ?1. Hyperlipidemia associated with type 2 diabetes mellitus ([place]) ?Low sodium ?- Lipid panel ?- rosuvastatin (CRESTOR) 20 MG tablet; Take 1 tablet (20 mg total) by mouth daily.  Dispense: 90 tablet; Refill: 1 ? ?2. Diabetes mellitus without complication ([place]) ?Continue to watch carbs in diet ?Exercise daly ?- Bayer DCA Hb A1c Waived ?- Microalbumin / creatinine urine ratio ?- dapagliflozin propanediol (FARXIGA) 10 MG TABS tablet; Take 1 tablet (10 mg total) by mouth daily before breakfast.  Dispense: 90 tablet; Refill: 5 ?- metFORMIN (GLUCOPHAGE) 1000 MG tablet; Take 1 tablet (1,000 mg total) by mouth 2 (two) times daily with a meal.  Dispense: 180 tablet; Refill:  1 ? ?3. BMI 26.0-26.9,adult ?Discussed diet and exercise for person with BMI >25 ?Will recheck weight in 3-6 months ?- CBC with Differential/Platelet ?- CMP14+EGFR ? ? ?Labs pending ?Health Maintenance reviewed ?Diet and exercise encouraged ? ?Follow up plan: ?3 months ? ? ?Mary-Margaret Hassell Done, FNP ? ? ?

## 2022-03-10 NOTE — Patient Instructions (Signed)
Heart-Healthy Eating Plan Heart-healthy meal planning includes: Eating less unhealthy fats. Eating more healthy fats. Making other changes in your diet. Talk with your doctor or a diet specialist (dietitian) to create an eating plan that is right for you. What is my plan? Your doctor may recommend an eating plan that includes: Total fat: ______% or less of total calories a day. Saturated fat: ______% or less of total calories a day. Cholesterol: less than _________mg a day. What are tips for following this plan? Cooking Avoid frying your food. Try to bake, boil, grill, or broil it instead. You can also reduce fat by: Removing the skin from poultry. Removing all visible fats from meats. Steaming vegetables in water or broth. Meal planning  At meals, divide your plate into four equal parts: Fill one-half of your plate with vegetables and green salads. Fill one-fourth of your plate with whole grains. Fill one-fourth of your plate with lean protein foods. Eat 4-5 servings of vegetables per day. A serving of vegetables is: 1 cup of raw or cooked vegetables. 2 cups of raw leafy greens. Eat 4-5 servings of fruit per day. A serving of fruit is: 1 medium whole fruit.  cup of dried fruit.  cup of fresh, frozen, or canned fruit.  cup of 100% fruit juice. Eat more foods that have soluble fiber. These are apples, broccoli, carrots, beans, peas, and barley. Try to get 20-30 g of fiber per day. Eat 4-5 servings of nuts, legumes, and seeds per week: 1 serving of dried beans or legumes equals  cup after being cooked. 1 serving of nuts is  cup. 1 serving of seeds equals 1 tablespoon. General information Eat more home-cooked food. Eat less restaurant, buffet, and fast food. Limit or avoid alcohol. Limit foods that are high in starch and sugar. Avoid fried foods. Lose weight if you are overweight. Keep track of how much salt (sodium) you eat. This is important if you have high blood  pressure. Ask your doctor to tell you more about this. Try to add vegetarian meals each week. Fats Choose healthy fats. These include olive oil and canola oil, flaxseeds, walnuts, almonds, and seeds. Eat more omega-3 fats. These include salmon, mackerel, sardines, tuna, flaxseed oil, and ground flaxseeds. Try to eat fish at least 2 times each week. Check food labels. Avoid foods with trans fats or high amounts of saturated fat. Limit saturated fats. These are often found in animal products, such as meats, butter, and cream. These are also found in plant foods, such as palm oil, palm kernel oil, and coconut oil. Avoid foods with partially hydrogenated oils in them. These have trans fats. Examples are stick margarine, some tub margarines, cookies, crackers, and other baked goods. What foods can I eat? Fruits All fresh, canned (in natural juice), or frozen fruits. Vegetables Fresh or frozen vegetables (raw, steamed, roasted, or grilled). Green salads. Grains Most grains. Choose whole wheat and whole grains most of the time. Rice and pasta, including brown rice and pastas made with whole wheat. Meats and other proteins Lean, well-trimmed beef, veal, pork, and lamb. Chicken and turkey without skin. All fish and shellfish. Wild duck, rabbit, pheasant, and venison. Egg whites or low-cholesterol egg substitutes. Dried beans, peas, lentils, and tofu. Seeds and most nuts. Dairy Low-fat or nonfat cheeses, including ricotta and mozzarella. Skim or 1% milk that is liquid, powdered, or evaporated. Buttermilk that is made with low-fat milk. Nonfat or low-fat yogurt. Fats and oils Non-hydrogenated (trans-free) margarines. Vegetable oils, including   soybean, sesame, sunflower, olive, peanut, safflower, corn, canola, and cottonseed. Salad dressings or mayonnaise made with a vegetable oil. Beverages Mineral water. Coffee and tea. Diet carbonated beverages. Sweets and desserts Sherbet, gelatin, and fruit ice.  Small amounts of dark chocolate. Limit all sweets and desserts. Seasonings and condiments All seasonings and condiments. The items listed above may not be a complete list of foods and drinks you can eat. Contact a dietitian for more options. What foods should I avoid? Fruits Canned fruit in heavy syrup. Fruit in cream or butter sauce. Fried fruit. Limit coconut. Vegetables Vegetables cooked in cheese, cream, or butter sauce. Fried vegetables. Grains Breads that are made with saturated or trans fats, oils, or whole milk. Croissants. Sweet rolls. Donuts. High-fat crackers, such as cheese crackers. Meats and other proteins Fatty meats, such as hot dogs, ribs, sausage, bacon, rib-eye roast or steak. High-fat deli meats, such as salami and bologna. Caviar. Domestic duck and goose. Organ meats, such as liver. Dairy Cream, sour cream, cream cheese, and creamed cottage cheese. Whole-milk cheeses. Whole or 2% milk that is liquid, evaporated, or condensed. Whole buttermilk. Cream sauce or high-fat cheese sauce. Yogurt that is made from whole milk. Fats and oils Meat fat, or shortening. Cocoa butter, hydrogenated oils, palm oil, coconut oil, palm kernel oil. Solid fats and shortenings, including bacon fat, salt pork, lard, and butter. Nondairy cream substitutes. Salad dressings with cheese or sour cream. Beverages Regular sodas and juice drinks with added sugar. Sweets and desserts Frosting. Pudding. Cookies. Cakes. Pies. Milk chocolate or white chocolate. Buttered syrups. Full-fat ice cream or ice cream drinks. The items listed above may not be a complete list of foods and drinks to avoid. Contact a dietitian for more information. Summary Heart-healthy meal planning includes eating less unhealthy fats, eating more healthy fats, and making other changes in your diet. Eat a balanced diet. This includes fruits and vegetables, low-fat or nonfat dairy, lean protein, nuts and legumes, whole grains, and  heart-healthy oils and fats. This information is not intended to replace advice given to you by your health care provider. Make sure you discuss any questions you have with your health care provider. Document Revised: 04/10/2021 Document Reviewed: 04/10/2021 Elsevier Patient Education  2022 Elsevier Inc.  

## 2022-03-11 LAB — CMP14+EGFR
ALT: 17 IU/L (ref 0–44)
AST: 19 IU/L (ref 0–40)
Albumin/Globulin Ratio: 1.7 (ref 1.2–2.2)
Albumin: 4.5 g/dL (ref 3.8–4.8)
Alkaline Phosphatase: 73 IU/L (ref 44–121)
BUN/Creatinine Ratio: 9 — ABNORMAL LOW (ref 10–24)
BUN: 8 mg/dL (ref 8–27)
Bilirubin Total: 0.6 mg/dL (ref 0.0–1.2)
CO2: 25 mmol/L (ref 20–29)
Calcium: 10.2 mg/dL (ref 8.6–10.2)
Chloride: 97 mmol/L (ref 96–106)
Creatinine, Ser: 0.88 mg/dL (ref 0.76–1.27)
Globulin, Total: 2.7 g/dL (ref 1.5–4.5)
Glucose: 191 mg/dL — ABNORMAL HIGH (ref 70–99)
Potassium: 4.8 mmol/L (ref 3.5–5.2)
Sodium: 137 mmol/L (ref 134–144)
Total Protein: 7.2 g/dL (ref 6.0–8.5)
eGFR: 98 mL/min/{1.73_m2} (ref >59)

## 2022-03-11 LAB — CBC WITH DIFFERENTIAL/PLATELET
Basophils Absolute: 0 10*3/uL (ref 0.0–0.2)
Basos: 1 %
EOS (ABSOLUTE): 0.1 10*3/uL (ref 0.0–0.4)
Eos: 2 %
Hematocrit: 46.9 % (ref 37.5–51.0)
Hemoglobin: 16 g/dL (ref 13.0–17.7)
Immature Grans (Abs): 0 10*3/uL (ref 0.0–0.1)
Immature Granulocytes: 0 %
Lymphocytes Absolute: 2.6 10*3/uL (ref 0.7–3.1)
Lymphs: 33 %
MCH: 30.4 pg (ref 26.6–33.0)
MCHC: 34.1 g/dL (ref 31.5–35.7)
MCV: 89 fL (ref 79–97)
Monocytes Absolute: 0.6 10*3/uL (ref 0.1–0.9)
Monocytes: 8 %
Neutrophils Absolute: 4.4 10*3/uL (ref 1.4–7.0)
Neutrophils: 56 %
Platelets: 299 10*3/uL (ref 150–450)
RBC: 5.27 x10E6/uL (ref 4.14–5.80)
RDW: 12.9 % (ref 11.6–15.4)
WBC: 7.9 10*3/uL (ref 3.4–10.8)

## 2022-03-11 LAB — LIPID PANEL
Chol/HDL Ratio: 4.2 ratio (ref 0.0–5.0)
Cholesterol, Total: 180 mg/dL (ref 100–199)
HDL: 43 mg/dL (ref >39)
LDL Chol Calc (NIH): 110 mg/dL — ABNORMAL HIGH (ref 0–99)
Triglycerides: 153 mg/dL — ABNORMAL HIGH (ref 0–149)
VLDL Cholesterol Cal: 27 mg/dL (ref 5–40)

## 2022-03-11 LAB — MICROALBUMIN / CREATININE URINE RATIO
Creatinine, Urine: 30.7 mg/dL
Microalb/Creat Ratio: <10 mg/g creat (ref 0–29)
Microalbumin, Urine: <3 ug/mL

## 2022-05-08 DIAGNOSIS — H4710 Unspecified papilledema: Secondary | ICD-10-CM | POA: Diagnosis not present

## 2022-05-08 DIAGNOSIS — Q8789 Other specified congenital malformation syndromes, not elsewhere classified: Secondary | ICD-10-CM | POA: Diagnosis not present

## 2022-05-08 DIAGNOSIS — H543 Unqualified visual loss, both eyes: Secondary | ICD-10-CM | POA: Diagnosis not present

## 2022-05-08 DIAGNOSIS — Z974 Presence of external hearing-aid: Secondary | ICD-10-CM | POA: Diagnosis not present

## 2022-05-08 DIAGNOSIS — H5362 Acquired night blindness: Secondary | ICD-10-CM | POA: Diagnosis not present

## 2022-05-08 DIAGNOSIS — H53453 Other localized visual field defect, bilateral: Secondary | ICD-10-CM | POA: Diagnosis not present

## 2022-05-08 DIAGNOSIS — Z83518 Family history of other specified eye disorder: Secondary | ICD-10-CM | POA: Diagnosis not present

## 2022-05-08 DIAGNOSIS — H469 Unspecified optic neuritis: Secondary | ICD-10-CM | POA: Diagnosis not present

## 2022-05-08 DIAGNOSIS — H3554 Dystrophies primarily involving the retinal pigment epithelium: Secondary | ICD-10-CM | POA: Diagnosis not present

## 2022-06-12 ENCOUNTER — Ambulatory Visit (INDEPENDENT_AMBULATORY_CARE_PROVIDER_SITE_OTHER): Payer: Medicare HMO | Admitting: Nurse Practitioner

## 2022-06-12 ENCOUNTER — Encounter: Payer: Self-pay | Admitting: Nurse Practitioner

## 2022-06-12 VITALS — BP 121/82 | HR 84 | Temp 98.3°F | Resp 20 | Ht 68.0 in | Wt 157.0 lb

## 2022-06-12 DIAGNOSIS — E785 Hyperlipidemia, unspecified: Secondary | ICD-10-CM

## 2022-06-12 DIAGNOSIS — R972 Elevated prostate specific antigen [PSA]: Secondary | ICD-10-CM | POA: Diagnosis not present

## 2022-06-12 DIAGNOSIS — H3552 Pigmentary retinal dystrophy: Secondary | ICD-10-CM | POA: Diagnosis not present

## 2022-06-12 DIAGNOSIS — Z6826 Body mass index (BMI) 26.0-26.9, adult: Secondary | ICD-10-CM | POA: Diagnosis not present

## 2022-06-12 DIAGNOSIS — E1169 Type 2 diabetes mellitus with other specified complication: Secondary | ICD-10-CM | POA: Diagnosis not present

## 2022-06-12 DIAGNOSIS — E119 Type 2 diabetes mellitus without complications: Secondary | ICD-10-CM

## 2022-06-12 LAB — BAYER DCA HB A1C WAIVED: HB A1C (BAYER DCA - WAIVED): 8.9 % — ABNORMAL HIGH (ref 4.8–5.6)

## 2022-06-12 MED ORDER — METFORMIN HCL 1000 MG PO TABS: 1000.0000 mg | ORAL_TABLET | Freq: Two times a day (BID) | ORAL | 1 refills | Status: DC

## 2022-06-12 MED ORDER — ROSUVASTATIN CALCIUM 20 MG PO TABS: 20.0000 mg | ORAL_TABLET | Freq: Every day | ORAL | 1 refills | Status: DC

## 2022-06-12 NOTE — Progress Notes (Signed)
Subjective:    Patient ID: Patrick Valentine, male    DOB: [birth year], 61 y.o.   MRN: [id number]   Chief Complaint: Medical Management of Chronic Issues    HPI:  Patrick Valentine is a 61 y.o. who identifies as a male who was assigned male at birth.   Social history: Lives with: wife Work history: retited   Comes in today for follow up of the following chronic medical issues:  1. Hyperlipidemia associated with type 2 diabetes mellitus ([place]) Does watch diet and exercises daily. Lab Results  Component Value Date   CHOL 180 03/10/2022   HDL 43 03/10/2022   LDLCALC 110 (H) 03/10/2022   TRIG 153 (H) 03/10/2022   CHOLHDL 4.2 03/10/2022     2. Diabetes mellitus without complication ([facility]) Fasting blood sugars are running between 100-140. He does not check it daily. Lab Results  Component Value Date   HGBA1C 8.7 (H) 03/10/2022      3. Elevated PSA No voiding issues Lab Results  Component Value Date   PSA1 4.4 (H) 08/26/2021   PSA1 4.5 (H) 05/23/2021   PSA1 4.4 (H) 01/19/2020   PSA 2.5 07/20/2014      4. Retinitis pigmentosa of both eyes Vision is stable. Sees specialist every 3-6 months  5. BMI 26.0-26.9,adult No recent weight changes Wt Readings from Last 3 Encounters:  06/12/22 157 lb (71.2 kg)  03/10/22 160 lb (72.6 kg)  01/06/22 156 lb (70.8 kg)   BMI Readings from Last 3 Encounters:  06/12/22 23.87 kg/m  03/10/22 24.33 kg/m  01/06/22 23.72 kg/m      New complaints: None today  No Known Allergies Outpatient Encounter Medications as of 06/12/2022  Medication Sig   ACCU-CHEK SOFTCLIX LANCETS lancets Test BID   Acetylcysteine 600 MG CAPS Take by mouth.   Alcohol Swabs (B-D SINGLE USE SWABS REGULAR) PADS Test BID   Blood Glucose Calibration (ACCU-CHEK AVIVA) SOLN Use with glucose meter   Blood Glucose Monitoring Suppl (ACCU-CHEK AVIVA PLUS) w/Device KIT Test BID   celecoxib (CELEBREX) 200 MG capsule Take 1 capsule (200 mg total) by mouth 2 (two)  times daily.   dapagliflozin propanediol (FARXIGA) 10 MG TABS tablet Take 1 tablet (10 mg total) by mouth daily before breakfast.   glucose blood (ACCU-CHEK AVIVA PLUS) test strip Test BID   Lancet Devices (ONE TOUCH DELICA LANCING DEV) MISC Use to check blood sugar daily   Lancets Misc. (ACCU-CHEK FASTCLIX LANCET) KIT Test BID   metFORMIN (GLUCOPHAGE) 1000 MG tablet Take 1 tablet (1,000 mg total) by mouth 2 (two) times daily with a meal.   rosuvastatin (CRESTOR) 20 MG tablet Take 1 tablet (20 mg total) by mouth daily.   TRUEplus Lancets 33G MISC Use to check blood sugar twice daily and prn. Dx Code E11.9   No facility-administered encounter medications on file as of 06/12/2022.    Past Surgical History:  Procedure Laterality Date   NO PAST SURGERIES      Family History  Problem Relation Age of Onset   Diabetes Father 20       IDDM   Diabetes Sister 2       IDDM   Colon cancer Neg Hx    Esophageal cancer Neg Hx    Stomach cancer Neg Hx    Rectal cancer Neg Hx       Controlled substance contract: n/a     Review of Systems  Constitutional:  Negative for diaphoresis.  Eyes:  Negative for pain.  Respiratory:  Negative for shortness of breath.   Cardiovascular:  Negative for chest pain, palpitations and leg swelling.  Gastrointestinal:  Negative for abdominal pain.  Endocrine: Negative for polydipsia.  Skin:  Negative for rash.  Neurological:  Negative for dizziness, weakness and headaches.  Hematological:  Does not bruise/bleed easily.  All other systems reviewed and are negative.      Objective:   Physical Exam Vitals and nursing note reviewed.  Constitutional:      Appearance: Normal appearance. He is well-developed.  HENT:     Head: Normocephalic.     Nose: Nose normal.     Mouth/Throat:     Mouth: Mucous membranes are moist.     Pharynx: Oropharynx is clear.  Eyes:     Pupils: Pupils are equal, round, and reactive to light.  Neck:     Thyroid: No  thyroid mass or thyromegaly.     Vascular: No carotid bruit or JVD.     Trachea: Phonation normal.  Cardiovascular:     Rate and Rhythm: Normal rate and regular rhythm.  Pulmonary:     Effort: Pulmonary effort is normal. No respiratory distress.     Breath sounds: Normal breath sounds.  Abdominal:     General: Bowel sounds are normal.     Palpations: Abdomen is soft.     Tenderness: There is no abdominal tenderness.  Musculoskeletal:        General: Normal range of motion.     Cervical back: Normal range of motion and neck supple.  Lymphadenopathy:     Cervical: No cervical adenopathy.  Skin:    General: Skin is warm and dry.  Neurological:     Mental Status: He is alert and oriented to person, place, and time.  Psychiatric:        Behavior: Behavior normal.        Thought Content: Thought content normal.        Judgment: Judgment normal.    BP 121/82   Pulse 84   Temp 98.3 F (36.8 C) (Temporal)   Resp 20   Ht 5' 8"  (1.727 m)   Wt 157 lb (71.2 kg)   SpO2 99%   BMI 23.87 kg/m    Hgba1c 8.9%     Assessment & Plan:   Patrick Valentine comes in today with chief complaint of Medical Management of Chronic Issues   Diagnosis and orders addressed:  1. Hyperlipidemia associated with type 2 diabetes mellitus ([facility]) Low diet - Lipid panel  2. Diabetes mellitus without complication ([place]) Stricter carb counting.- patient did not want medication changes - Bayer DCA Hb A1c Waived - CBC with Differential/Platelet - CMP14+EGFR  3. Elevated PSA Labs pending - PSA, total and free  4. Retinitis pigmentosa of both eyes Keep follow up with cardiology  5. BMI 26.0-26.9,adult Discussed diet and exercise for person with BMI >25 Will recheck weight in 3-6 months    Labs pending Health Maintenance reviewed Diet and exercise encouraged  Follow up plan: 3 months   Mary-Margaret Hassell Done, FNP

## 2022-06-12 NOTE — Addendum Note (Signed)
Addended by: Chevis Pretty on: 06/12/2022 09:10 AM   Modules accepted: Orders

## 2022-06-12 NOTE — Patient Instructions (Signed)

## 2022-06-13 LAB — CBC WITH DIFFERENTIAL/PLATELET
Basophils Absolute: 0.1 10*3/uL (ref 0.0–0.2)
Basos: 1 %
EOS (ABSOLUTE): 0.1 10*3/uL (ref 0.0–0.4)
Eos: 1 %
Hematocrit: 48 % (ref 37.5–51.0)
Hemoglobin: 16 g/dL (ref 13.0–17.7)
Immature Grans (Abs): 0 10*3/uL (ref 0.0–0.1)
Immature Granulocytes: 0 %
Lymphocytes Absolute: 2 10*3/uL (ref 0.7–3.1)
Lymphs: 29 %
MCH: 30.1 pg (ref 26.6–33.0)
MCHC: 33.3 g/dL (ref 31.5–35.7)
MCV: 90 fL (ref 79–97)
Monocytes Absolute: 0.6 10*3/uL (ref 0.1–0.9)
Monocytes: 9 %
Neutrophils Absolute: 4.1 10*3/uL (ref 1.4–7.0)
Neutrophils: 60 %
Platelets: 306 10*3/uL (ref 150–450)
RBC: 5.31 x10E6/uL (ref 4.14–5.80)
RDW: 13.2 % (ref 11.6–15.4)
WBC: 7 10*3/uL (ref 3.4–10.8)

## 2022-06-13 LAB — CMP14+EGFR
ALT: 16 IU/L (ref 0–44)
AST: 14 IU/L (ref 0–40)
Albumin/Globulin Ratio: 1.9 (ref 1.2–2.2)
Albumin: 4.6 g/dL (ref 3.8–4.8)
Alkaline Phosphatase: 65 IU/L (ref 44–121)
BUN/Creatinine Ratio: 13 (ref 10–24)
BUN: 10 mg/dL (ref 8–27)
Bilirubin Total: 0.6 mg/dL (ref 0.0–1.2)
CO2: 23 mmol/L (ref 20–29)
Calcium: 10 mg/dL (ref 8.6–10.2)
Chloride: 98 mmol/L (ref 96–106)
Creatinine, Ser: 0.79 mg/dL (ref 0.76–1.27)
Globulin, Total: 2.4 g/dL (ref 1.5–4.5)
Glucose: 190 mg/dL — ABNORMAL HIGH (ref 70–99)
Potassium: 4.7 mmol/L (ref 3.5–5.2)
Sodium: 135 mmol/L (ref 134–144)
Total Protein: 7 g/dL (ref 6.0–8.5)
eGFR: 101 mL/min/{1.73_m2} (ref >59)

## 2022-06-13 LAB — PSA, TOTAL AND FREE
PSA, Free Pct: 15.7 %
PSA, Free: 0.74 ng/mL
Prostate Specific Ag, Serum: 4.7 ng/mL — ABNORMAL HIGH (ref 0.0–4.0)

## 2022-06-13 LAB — LIPID PANEL
Chol/HDL Ratio: 3.5 ratio (ref 0.0–5.0)
Cholesterol, Total: 163 mg/dL (ref 100–199)
HDL: 47 mg/dL (ref >39)
LDL Chol Calc (NIH): 95 mg/dL (ref 0–99)
Triglycerides: 118 mg/dL (ref 0–149)
VLDL Cholesterol Cal: 21 mg/dL (ref 5–40)

## 2022-07-17 ENCOUNTER — Telehealth: Payer: Self-pay | Admitting: Pharmacist

## 2022-07-17 DIAGNOSIS — E119 Type 2 diabetes mellitus without complications: Secondary | ICD-10-CM

## 2022-07-17 MED ORDER — DAPAGLIFLOZIN PROPANEDIOL 10 MG PO TABS: 10.0000 mg | ORAL_TABLET | Freq: Every day | ORAL | 5 refills | Status: DC

## 2022-07-17 NOTE — Telephone Encounter (Signed)
Refills sent to [facility] for farxiga patient assistance

## 2022-09-28 ENCOUNTER — Encounter: Payer: Self-pay | Admitting: Nurse Practitioner

## 2022-09-28 ENCOUNTER — Ambulatory Visit (INDEPENDENT_AMBULATORY_CARE_PROVIDER_SITE_OTHER): Payer: Medicare HMO | Admitting: Nurse Practitioner

## 2022-09-28 VITALS — BP 135/91 | HR 89 | Temp 97.6°F | Resp 20 | Ht 68.0 in | Wt 156.0 lb

## 2022-09-28 DIAGNOSIS — E785 Hyperlipidemia, unspecified: Secondary | ICD-10-CM | POA: Diagnosis not present

## 2022-09-28 DIAGNOSIS — M25561 Pain in right knee: Secondary | ICD-10-CM | POA: Diagnosis not present

## 2022-09-28 DIAGNOSIS — Z6826 Body mass index (BMI) 26.0-26.9, adult: Secondary | ICD-10-CM

## 2022-09-28 DIAGNOSIS — E119 Type 2 diabetes mellitus without complications: Secondary | ICD-10-CM | POA: Diagnosis not present

## 2022-09-28 DIAGNOSIS — E1169 Type 2 diabetes mellitus with other specified complication: Secondary | ICD-10-CM

## 2022-09-28 DIAGNOSIS — R972 Elevated prostate specific antigen [PSA]: Secondary | ICD-10-CM | POA: Diagnosis not present

## 2022-09-28 DIAGNOSIS — H3552 Pigmentary retinal dystrophy: Secondary | ICD-10-CM

## 2022-09-28 DIAGNOSIS — Z23 Encounter for immunization: Secondary | ICD-10-CM | POA: Diagnosis not present

## 2022-09-28 LAB — BAYER DCA HB A1C WAIVED: HB A1C (BAYER DCA - WAIVED): 8.1 % — ABNORMAL HIGH (ref 4.8–5.6)

## 2022-09-28 MED ORDER — CELECOXIB 200 MG PO CAPS: 200.0000 mg | ORAL_CAPSULE | Freq: Two times a day (BID) | ORAL | 2 refills | Status: DC

## 2022-09-28 MED ORDER — ACCU-CHEK AVIVA PLUS VI STRP: ORAL_STRIP | 12 refills | Status: DC

## 2022-09-28 MED ORDER — ACCU-CHEK FASTCLIX LANCET KIT: PACK | 0 refills | Status: DC

## 2022-09-28 MED ORDER — METFORMIN HCL 1000 MG PO TABS: 1000.0000 mg | ORAL_TABLET | Freq: Two times a day (BID) | ORAL | 1 refills | Status: DC

## 2022-09-28 MED ORDER — ROSUVASTATIN CALCIUM 20 MG PO TABS: 20.0000 mg | ORAL_TABLET | Freq: Every day | ORAL | 1 refills | Status: DC

## 2022-09-28 NOTE — Patient Instructions (Signed)

## 2022-09-28 NOTE — Progress Notes (Signed)
Subjective:    Patient ID: Patrick Valentine, male    DOB: 02-25-1961, 61 y.o.   MRN: [id number]   Chief Complaint: medical management of chronic issues     HPI:  Patrick Valentine is a 61 y.o. who identifies as a male who was assigned male at birth.   Social history: Lives with: wife Work history: rtired   Comes in today for follow up of the following chronic medical issues:  1. Diabetes mellitus without complication ([place]) Checks blood sugars daily and are running around 150-170. Lab Results  Component Value Date   HGBA1C 8.9 (H) 06/12/2022     2. Hyperlipidemia associated with type 2 diabetes mellitus ([place]) Has been watching diet and exercises several times a week. Lab Results  Component Value Date   CHOL 163 06/12/2022   HDL 47 06/12/2022   LDLCALC 95 06/12/2022   TRIG 118 06/12/2022   CHOLHDL 3.5 06/12/2022     3. Elevated PSA No voiding issues. Lab Results  Component Value Date   PSA1 4.7 (H) 06/12/2022   PSA1 4.4 (H) 08/26/2021   PSA1 4.5 (H) 05/23/2021   PSA 2.5 07/20/2014      4. Retinitis pigmentosa of both eyes See specialist yearly  5. BMI 26.0-26.9,adult No recent weight changes Wt Readings from Last 3 Encounters:  09/28/22 156 lb (70.8 kg)  06/12/22 157 lb (71.2 kg)  03/10/22 160 lb (72.6 kg)   BMI Readings from Last 3 Encounters:  09/28/22 23.72 kg/m  06/12/22 23.87 kg/m  03/10/22 24.33 kg/m      New complaints: None today  No Known Allergies Outpatient Encounter Medications as of 09/28/2022  Medication Sig   ACCU-CHEK SOFTCLIX LANCETS lancets Test BID   Acetylcysteine 600 MG CAPS Take by mouth.   Alcohol Swabs (B-D SINGLE USE SWABS REGULAR) PADS Test BID   Blood Glucose Calibration (ACCU-CHEK AVIVA) SOLN Use with glucose meter   Blood Glucose Monitoring Suppl (ACCU-CHEK AVIVA PLUS) w/Device KIT Test BID   celecoxib (CELEBREX) 200 MG capsule Take 1 capsule (200 mg total) by mouth 2 (two) times daily.   dapagliflozin  propanediol (FARXIGA) 10 MG TABS tablet Take 1 tablet (10 mg total) by mouth daily before breakfast.   glucose blood (ACCU-CHEK AVIVA PLUS) test strip Test BID   Lancet Devices (ONE TOUCH DELICA LANCING DEV) MISC Use to check blood sugar daily   Lancets Misc. (ACCU-CHEK FASTCLIX LANCET) KIT Test BID   metFORMIN (GLUCOPHAGE) 1000 MG tablet Take 1 tablet (1,000 mg total) by mouth 2 (two) times daily with a meal.   rosuvastatin (CRESTOR) 20 MG tablet Take 1 tablet (20 mg total) by mouth daily.   TRUEplus Lancets 33G MISC Use to check blood sugar twice daily and prn. Dx Code E11.9   No facility-administered encounter medications on file as of 62/16/2023.    Past Surgical History:  Procedure Laterality Date   NO PAST SURGERIES      Family History  Problem Relation Age of Onset   Diabetes Father 51       IDDM   Diabetes Sister 72       IDDM   Colon cancer Neg Hx    Esophageal cancer Neg Hx    Stomach cancer Neg Hx    Rectal cancer Neg Hx       Controlled substance contract: n/a     Review of Systems  Constitutional:  Negative for diaphoresis.  Eyes:  Negative for pain.  Respiratory:  Negative for shortness of breath.  Cardiovascular:  Negative for chest pain, palpitations and leg swelling.  Gastrointestinal:  Negative for abdominal pain.  Endocrine: Negative for polydipsia.  Skin:  Negative for rash.  Neurological:  Negative for dizziness, weakness and headaches.  Hematological:  Does not bruise/bleed easily.  All other systems reviewed and are negative.      Objective:   Physical Exam Vitals and nursing note reviewed.  Constitutional:      Appearance: Normal appearance. He is well-developed.  HENT:     Head: Normocephalic.     Nose: Nose normal.     Mouth/Throat:     Mouth: Mucous membranes are moist.     Pharynx: Oropharynx is clear.  Eyes:     Pupils: Pupils are equal, round, and reactive to light.  Neck:     Thyroid: No thyroid mass or thyromegaly.      Vascular: No carotid bruit or JVD.     Trachea: Phonation normal.  Cardiovascular:     Rate and Rhythm: Normal rate and regular rhythm.  Pulmonary:     Effort: Pulmonary effort is normal. No respiratory distress.     Breath sounds: Normal breath sounds.  Abdominal:     General: Bowel sounds are normal.     Palpations: Abdomen is soft.     Tenderness: There is no abdominal tenderness.  Musculoskeletal:        General: Normal range of motion.     Cervical back: Normal range of motion and neck supple.  Lymphadenopathy:     Cervical: No cervical adenopathy.  Skin:    General: Skin is warm and dry.  Neurological:     Mental Status: He is alert and oriented to person, place, and time.  Psychiatric:        Behavior: Behavior normal.        Thought Content: Thought content normal.        Judgment: Judgment normal.    BP (!) 135/91   Pulse 89   Temp 97.6 F (36.4 C) (Temporal)   Resp 20   Ht _0  (1.727 m)   Wt 156 lb (70.8 kg)   SpO2 100%   BMI 23.72 kg/m   HGBA1c 8.1%      Assessment & Plan:  Patrick Valentine comes in today with chief complaint of Medical Management of Chronic Issues   Diagnosis and orders addressed:  1. Diabetes mellitus without complication ([place]) Stricter carb counting - Bayer DCA Hb A1c Waived - CBC with Differential/Platelet - CMP14+EGFR - metFORMIN (GLUCOPHAGE) 1000 MG tablet; Take 1 tablet (1,000 mg total) by mouth 2 (two) times daily with a meal.  Dispense: 180 tablet; Refill: 1  2. Hyperlipidemia associated with type 2 diabetes mellitus ([facility]) Low fta diet - Lipid panel - rosuvastatin (CRESTOR) 20 MG tablet; Take 1 tablet (20 mg total) by mouth daily.  Dispense: 90 tablet; Refill: 1  3. Elevated PSA Labs pending - PSA, total and free  4. Retinitis pigmentosa of both eyes Keep follow up with specialist  5. BMI 26.0-26.9,adult Discussed diet and exercise for person with BMI >25 Will recheck weight in 3-6 months   6. Acute pain of  right knee Ice BID - celecoxib (CELEBREX) 200 MG capsule; Take 1 capsule (200 mg total) by mouth 2 (two) times daily.  Dispense: 60 capsule; Refill: 2   Labs pending Health Maintenance reviewed Diet and exercise encouraged  Follow up plan: 3 months   Mary-Margaret Hassell Done, FNP

## 2022-09-29 LAB — CBC WITH DIFFERENTIAL/PLATELET
Basophils Absolute: 0.1 10*3/uL (ref 0.0–0.2)
Basos: 1 %
EOS (ABSOLUTE): 0.1 10*3/uL (ref 0.0–0.4)
Eos: 1 %
Hematocrit: 48.1 % (ref 37.5–51.0)
Hemoglobin: 16.2 g/dL (ref 13.0–17.7)
Immature Grans (Abs): 0 10*3/uL (ref 0.0–0.1)
Immature Granulocytes: 0 %
Lymphocytes Absolute: 1.6 10*3/uL (ref 0.7–3.1)
Lymphs: 25 %
MCH: 30.9 pg (ref 26.6–33.0)
MCHC: 33.7 g/dL (ref 31.5–35.7)
MCV: 92 fL (ref 79–97)
Monocytes Absolute: 0.6 10*3/uL (ref 0.1–0.9)
Monocytes: 9 %
Neutrophils Absolute: 4.1 10*3/uL (ref 1.4–7.0)
Neutrophils: 64 %
Platelets: 305 10*3/uL (ref 150–450)
RBC: 5.24 x10E6/uL (ref 4.14–5.80)
RDW: 13.4 % (ref 11.6–15.4)
WBC: 6.5 10*3/uL (ref 3.4–10.8)

## 2022-09-29 LAB — LIPID PANEL
Chol/HDL Ratio: 3.5 ratio (ref 0.0–5.0)
Cholesterol, Total: 163 mg/dL (ref 100–199)
HDL: 46 mg/dL (ref >39)
LDL Chol Calc (NIH): 96 mg/dL (ref 0–99)
Triglycerides: 118 mg/dL (ref 0–149)
VLDL Cholesterol Cal: 21 mg/dL (ref 5–40)

## 2022-09-29 LAB — PSA, TOTAL AND FREE
PSA, Free Pct: 14.5 %
PSA, Free: 0.71 ng/mL
Prostate Specific Ag, Serum: 4.9 ng/mL — ABNORMAL HIGH (ref 0.0–4.0)

## 2022-09-29 LAB — CMP14+EGFR
ALT: 13 IU/L (ref 0–44)
AST: 16 IU/L (ref 0–40)
Albumin/Globulin Ratio: 2.2 (ref 1.2–2.2)
Albumin: 4.8 g/dL (ref 3.9–4.9)
Alkaline Phosphatase: 64 IU/L (ref 44–121)
BUN/Creatinine Ratio: 10 (ref 10–24)
BUN: 8 mg/dL (ref 8–27)
Bilirubin Total: 0.5 mg/dL (ref 0.0–1.2)
CO2: 24 mmol/L (ref 20–29)
Calcium: 9.8 mg/dL (ref 8.6–10.2)
Chloride: 100 mmol/L (ref 96–106)
Creatinine, Ser: 0.82 mg/dL (ref 0.76–1.27)
Globulin, Total: 2.2 g/dL (ref 1.5–4.5)
Glucose: 172 mg/dL — ABNORMAL HIGH (ref 70–99)
Potassium: 5.3 mmol/L — ABNORMAL HIGH (ref 3.5–5.2)
Sodium: 137 mmol/L (ref 134–144)
Total Protein: 7 g/dL (ref 6.0–8.5)
eGFR: 99 mL/min/{1.73_m2} (ref >59)

## 2022-10-02 ENCOUNTER — Telehealth: Payer: Self-pay

## 2022-10-02 NOTE — Telephone Encounter (Signed)
Received notification from [employer] regarding RE-ENROLLMENT approval for FARXIGA. Patient assistance approved from 12/14/22 to 12/14/23.  Phone: [phone]  

## 2022-10-05 ENCOUNTER — Other Ambulatory Visit: Payer: Self-pay

## 2022-10-05 MED ORDER — ACCU-CHEK FASTCLIX LANCETS MISC: 5 refills | Status: DC

## 2022-10-22 NOTE — Telephone Encounter (Signed)
Disregard previous note - patient ELIGIBLE for 2024 enrollment.

## 2022-11-12 DIAGNOSIS — H903 Sensorineural hearing loss, bilateral: Secondary | ICD-10-CM | POA: Diagnosis not present

## 2022-11-12 DIAGNOSIS — H53453 Other localized visual field defect, bilateral: Secondary | ICD-10-CM | POA: Diagnosis not present

## 2022-11-12 DIAGNOSIS — H5362 Acquired night blindness: Secondary | ICD-10-CM | POA: Diagnosis not present

## 2022-11-12 DIAGNOSIS — Q8789 Other specified congenital malformation syndromes, not elsewhere classified: Secondary | ICD-10-CM | POA: Diagnosis not present

## 2022-12-29 ENCOUNTER — Encounter: Payer: Self-pay | Admitting: Nurse Practitioner

## 2022-12-29 ENCOUNTER — Ambulatory Visit (INDEPENDENT_AMBULATORY_CARE_PROVIDER_SITE_OTHER): Payer: Medicare HMO | Admitting: Nurse Practitioner

## 2022-12-29 VITALS — BP 131/89 | HR 92 | Temp 98.1°F | Resp 20 | Ht 68.0 in | Wt 156.0 lb

## 2022-12-29 DIAGNOSIS — E785 Hyperlipidemia, unspecified: Secondary | ICD-10-CM

## 2022-12-29 DIAGNOSIS — E1169 Type 2 diabetes mellitus with other specified complication: Secondary | ICD-10-CM | POA: Diagnosis not present

## 2022-12-29 DIAGNOSIS — Z6826 Body mass index (BMI) 26.0-26.9, adult: Secondary | ICD-10-CM

## 2022-12-29 DIAGNOSIS — E119 Type 2 diabetes mellitus without complications: Secondary | ICD-10-CM

## 2022-12-29 LAB — LIPID PANEL
Chol/HDL Ratio: 4.2 ratio (ref 0.0–5.0)
Cholesterol, Total: 184 mg/dL (ref 100–199)
HDL: 44 mg/dL (ref >39)
LDL Chol Calc (NIH): 109 mg/dL — ABNORMAL HIGH (ref 0–99)
Triglycerides: 175 mg/dL — ABNORMAL HIGH (ref 0–149)
VLDL Cholesterol Cal: 31 mg/dL (ref 5–40)

## 2022-12-29 LAB — CMP14+EGFR
ALT: 19 IU/L (ref 0–44)
AST: 17 IU/L (ref 0–40)
Albumin/Globulin Ratio: 2.2 (ref 1.2–2.2)
Albumin: 5 g/dL — ABNORMAL HIGH (ref 3.9–4.9)
Alkaline Phosphatase: 75 IU/L (ref 44–121)
BUN/Creatinine Ratio: 13 (ref 10–24)
BUN: 12 mg/dL (ref 8–27)
Bilirubin Total: 0.5 mg/dL (ref 0.0–1.2)
CO2: 22 mmol/L (ref 20–29)
Calcium: 10.1 mg/dL (ref 8.6–10.2)
Chloride: 96 mmol/L (ref 96–106)
Creatinine, Ser: 0.89 mg/dL (ref 0.76–1.27)
Globulin, Total: 2.3 g/dL (ref 1.5–4.5)
Glucose: 216 mg/dL — ABNORMAL HIGH (ref 70–99)
Potassium: 5.6 mmol/L — ABNORMAL HIGH (ref 3.5–5.2)
Sodium: 135 mmol/L (ref 134–144)
Total Protein: 7.3 g/dL (ref 6.0–8.5)
eGFR: 97 mL/min/{1.73_m2} (ref >59)

## 2022-12-29 LAB — CBC WITH DIFFERENTIAL/PLATELET
Basophils Absolute: 0 10*3/uL (ref 0.0–0.2)
Basos: 1 %
EOS (ABSOLUTE): 0.1 10*3/uL (ref 0.0–0.4)
Eos: 2 %
Hematocrit: 48.3 % (ref 37.5–51.0)
Hemoglobin: 16 g/dL (ref 13.0–17.7)
Immature Grans (Abs): 0 10*3/uL (ref 0.0–0.1)
Immature Granulocytes: 0 %
Lymphocytes Absolute: 2.2 10*3/uL (ref 0.7–3.1)
Lymphs: 29 %
MCH: 30 pg (ref 26.6–33.0)
MCHC: 33.1 g/dL (ref 31.5–35.7)
MCV: 90 fL (ref 79–97)
Monocytes Absolute: 0.7 10*3/uL (ref 0.1–0.9)
Monocytes: 9 %
Neutrophils Absolute: 4.5 10*3/uL (ref 1.4–7.0)
Neutrophils: 59 %
Platelets: 348 10*3/uL (ref 150–450)
RBC: 5.34 x10E6/uL (ref 4.14–5.80)
RDW: 13.2 % (ref 11.6–15.4)
WBC: 7.6 10*3/uL (ref 3.4–10.8)

## 2022-12-29 LAB — BAYER DCA HB A1C WAIVED: HB A1C (BAYER DCA - WAIVED): 9.4 % — ABNORMAL HIGH (ref 4.8–5.6)

## 2022-12-29 MED ORDER — ROSUVASTATIN CALCIUM 20 MG PO TABS: 20.0000 mg | ORAL_TABLET | Freq: Every day | ORAL | 1 refills | Status: DC

## 2022-12-29 MED ORDER — METFORMIN HCL 1000 MG PO TABS: 1000.0000 mg | ORAL_TABLET | Freq: Two times a day (BID) | ORAL | 1 refills | Status: DC

## 2022-12-29 NOTE — Patient Instructions (Signed)

## 2022-12-29 NOTE — Progress Notes (Signed)
Subjective:    Patient ID: Patrick Valentine, male    DOB: Apr 08, 1961, 62 y.o.   MRN: [id number]   Chief Complaint: medical management of chronic issues     HPI:  Patrick Valentine is a 62 y.o. who identifies as a male who was assigned male at birth.   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 Hyperlipidemia associated with type 2 diabetes mellitus (Patrick Valentine) Does try to watch diet. Exercises fluctuates from time  to time. Lately he has been walking a mile at least daily. Lab Results  Component Value Date   CHOL 163 09/28/2022   HDL 46 09/28/2022   LDLCALC 96 09/28/2022   TRIG 118 09/28/2022   CHOLHDL 3.5 09/28/2022     2. Diabetes mellitus without complication ([facility]) Fasting blood sugars are running around 150. He does not check it every day.Marland Kitchen  No changes were mad to mds at last visit. Patient wanted to try to get it down on his own. Lab Results  Component Value Date   HGBA1C 8.1 (H) 09/28/2022     3. BMI 26.0-26.9,adult No recent weight changes. Wt Readings from Last 3 Encounters:  12/29/22 156 lb (70.8 kg)  09/28/22 156 lb (70.8 kg)  06/12/22 157 lb (71.2 kg)   BMI Readings from Last 3 Encounters:  12/29/22 23.72 kg/m  09/28/22 23.72 kg/m  06/12/22 23.87 kg/m     New complaints: None today  No Known Allergies Outpatient Encounter Medications as of 12/29/2022  Medication Sig   Accu-Chek FastClix Lancets MISC Use to check blood sugar BID and prn   Acetylcysteine 600 MG CAPS Take by mouth.   Alcohol Swabs (B-D SINGLE USE SWABS REGULAR) PADS Test BID   Blood Glucose Calibration (ACCU-CHEK AVIVA) SOLN Use with glucose meter   Blood Glucose Monitoring Suppl (ACCU-CHEK AVIVA PLUS) w/Device KIT Test BID   celecoxib (CELEBREX) 200 MG capsule Take 1 capsule (200 mg total) by mouth 2 (two) times daily.   dapagliflozin propanediol (FARXIGA) 10 MG TABS tablet Take 1 tablet (10 mg  total) by mouth daily before breakfast.   glucose blood (ACCU-CHEK AVIVA PLUS) test strip Test BID   Lancet Devices (ONE TOUCH DELICA LANCING DEV) MISC Use to check blood sugar daily   Lancets Misc. (ACCU-CHEK FASTCLIX LANCET) KIT Test BID   metFORMIN (GLUCOPHAGE) 1000 MG tablet Take 1 tablet (1,000 mg total) by mouth 2 (two) times daily with a meal.   rosuvastatin (CRESTOR) 20 MG tablet Take 1 tablet (20 mg total) by mouth daily.   No facility-administered encounter medications on file as of 12/29/2022.    Past Surgical History:  Procedure Laterality Date   NO PAST SURGERIES      Family History  Problem Relation Age of Onset   Diabetes Father 58       IDDM   Diabetes Sister 63       IDDM   Colon cancer Neg Hx    Esophageal cancer Neg Hx    Stomach cancer Neg Hx    Rectal cancer Neg Hx       Controlled substance contract: n/a     Review of Systems  Constitutional:  Negative for diaphoresis.  Eyes:  Negative for pain.  Respiratory:  Negative for shortness of breath.   Cardiovascular:  Negative for chest pain, palpitations and leg swelling.  Gastrointestinal:  Negative for abdominal pain.  Endocrine: Negative for polydipsia.  Skin:  Negative for rash.  Neurological:  Negative for dizziness, weakness and headaches.  Hematological:  Does not bruise/bleed easily.  All other systems reviewed and are negative.      Objective:   Physical Exam Vitals and nursing note reviewed.  Constitutional:      Appearance: Normal appearance. He is well-developed.  HENT:     Head: Normocephalic.     Nose: Nose normal.     Mouth/Throat:     Mouth: Mucous membranes are moist.     Pharynx: Oropharynx is clear.  Eyes:     Pupils: Pupils are equal, round, and reactive to light.  Neck:     Thyroid: No thyroid mass or thyromegaly.     Vascular: No carotid bruit or JVD.     Trachea: Phonation normal.  Cardiovascular:     Rate and Rhythm: Normal rate and regular rhythm.  Pulmonary:      Effort: Pulmonary effort is normal. No respiratory distress.     Breath sounds: Normal breath sounds.  Abdominal:     General: Bowel sounds are normal.     Palpations: Abdomen is soft.     Tenderness: There is no abdominal tenderness.  Musculoskeletal:        General: Normal range of motion.     Cervical back: Normal range of motion and neck supple.  Lymphadenopathy:     Cervical: No cervical adenopathy.  Skin:    General: Skin is warm and dry.  Neurological:     Mental Status: He is alert and oriented to person, place, and time.  Psychiatric:        Behavior: Behavior normal.        Thought Content: Thought content normal.        Judgment: Judgment normal.    BP 131/89   Pulse 92   Temp 98.1 F (36.7 C) (Temporal)   Resp 20   Ht '5\' 8"'$  (1.727 m)   Wt 156 lb (70.8 kg)   SpO2 99%   BMI 23.72 kg/m   Hgba1c 9.4      Assessment & Plan:  Patrick Valentine comes in today with chief complaint of Medical Management of Chronic Issues   Diagnosis and orders addressed:  1. Hyperlipidemia associated with type 2 diabetes mellitus ([facility]) Low fat diet - Lipid panel - rosuvastatin (CRESTOR) 20 MG tablet; Take 1 tablet (20 mg total) by mouth daily.  Dispense: 90 tablet; Refill: 1  2. Diabetes mellitus without complication ([place]) Patient refuses medication changes Recheck in 1 month - Bayer DCA Hb A1c Waived - CBC with Differential/Platelet - CMP14+EGFR - metFORMIN (GLUCOPHAGE) 1000 MG tablet; Take 1 tablet (1,000 mg total) by mouth 2 (two) times daily with a meal.  Dispense: 180 tablet; Refill: 1  3. BMI 26.0-26.9,adult Discussed diet and exercise for person with BMI >25 Will recheck weight in 3-6 months    Labs pending Health Maintenance reviewed Diet and exercise encouraged  Follow up plan: 1 month   [place], FNP

## 2023-01-01 ENCOUNTER — Telehealth: Payer: Self-pay | Admitting: Pharmacist

## 2023-01-01 NOTE — Telephone Encounter (Signed)
Re-enroll farxiga '10mg'$  daily

## 2023-01-07 ENCOUNTER — Ambulatory Visit (INDEPENDENT_AMBULATORY_CARE_PROVIDER_SITE_OTHER): Payer: Medicare HMO

## 2023-01-07 VITALS — Ht 69.0 in | Wt 155.0 lb

## 2023-01-07 DIAGNOSIS — Z0000 Encounter for general adult medical examination without abnormal findings: Secondary | ICD-10-CM

## 2023-01-07 NOTE — Patient Instructions (Signed)
Patrick Valentine , Thank you for taking time to come for your Medicare Wellness Visit. I appreciate your ongoing commitment to your health goals. Please review the following plan we discussed and let me know if I can assist you in the future.   These are the goals we discussed:  Goals      Exercise 150 minutes per week (moderate activity)        This is a list of the screening recommended for you and due dates:  Health Maintenance  Topic Date Due   HIV Screening  Never done   Zoster (Shingles) Vaccine (1 of 2) Never done   Eye exam for diabetics  12/23/2022   COVID-19 Vaccine (5 - 2023-24 season) 01/14/2023*   Yearly kidney health urinalysis for diabetes  03/11/2023   Hemoglobin A1C  06/29/2023   Yearly kidney function blood test for diabetes  12/30/2023   Complete foot exam   12/30/2023   Medicare Annual Wellness Visit  01/08/2024   DTaP/Tdap/Td vaccine (4 - Td or Tdap) 11/28/2025   Colon Cancer Screening  06/13/2027   Flu Shot  Completed   Hepatitis C Screening: USPSTF Recommendation to screen - Ages 18-79 yo.  Completed   HPV Vaccine  Aged Out  *Topic was postponed. The date shown is not the original due date.    Advanced directives: Advance directive discussed with you today. I have provided a copy for you to complete at home and have notarized. Once this is complete please bring a copy in to our office so we can scan it into your chart.   Conditions/risks identified: Aim for 30 minutes of exercise or brisk walking, 6-8 glasses of water, and 5 servings of fruits and vegetables each day.   Next appointment: Follow up in one year for your annual wellness visit.   Preventive Care 63 Years and Older, Male  Preventive care refers to lifestyle choices and visits with your health care provider that can promote health and wellness. What does preventive care include? A yearly physical exam. This is also called an annual well check. Dental exams once or twice a year. Routine eye  exams. Ask your health care provider how often you should have your eyes checked. Personal lifestyle choices, including: Daily care of your teeth and gums. Regular physical activity. Eating a healthy diet. Avoiding tobacco and drug use. Limiting alcohol use. Practicing safe sex. Taking low doses of aspirin every day. Taking vitamin and mineral supplements as recommended by your health care provider. What happens during an annual well check? The services and screenings done by your health care provider during your annual well check will depend on your age, overall health, lifestyle risk factors, and family history of disease. Counseling  Your health care provider may ask you questions about your: Alcohol use. Tobacco use. Drug use. Emotional well-being. Home and relationship well-being. Sexual activity. Eating habits. History of falls. Memory and ability to understand (cognition). Work and work Statistician. Screening  You may have the following tests or measurements: Height, weight, and BMI. Blood pressure. Lipid and cholesterol levels. These may be checked every 5 years, or more frequently if you are over 71 years old. Skin check. Lung cancer screening. You may have this screening every year starting at age 3 if you have a 30-pack-year history of smoking and currently smoke or have quit within the past 15 years. Fecal occult blood test (FOBT) of the stool. You may have this test every year starting at age 19. Flexible  sigmoidoscopy or colonoscopy. You may have a sigmoidoscopy every 5 years or a colonoscopy every 10 years starting at age 9. Prostate cancer screening. Recommendations will vary depending on your family history and other risks. Hepatitis C blood test. Hepatitis B blood test. Sexually transmitted disease (STD) testing. Diabetes screening. This is done by checking your blood sugar (glucose) after you have not eaten for a while (fasting). You may have this done every  1-3 years. Abdominal aortic aneurysm (AAA) screening. You may need this if you are a current or former smoker. Osteoporosis. You may be screened starting at age 57 if you are at high risk. Talk with your health care provider about your test results, treatment options, and if necessary, the need for more tests. Vaccines  Your health care provider may recommend certain vaccines, such as: Influenza vaccine. This is recommended every year. Tetanus, diphtheria, and acellular pertussis (Tdap, Td) vaccine. You may need a Td booster every 10 years. Zoster vaccine. You may need this after age 35. Pneumococcal 13-valent conjugate (PCV13) vaccine. One dose is recommended after age 40. Pneumococcal polysaccharide (PPSV23) vaccine. One dose is recommended after age 8. Talk to your health care provider about which screenings and vaccines you need and how often you need them. This information is not intended to replace advice given to you by your health care provider. Make sure you discuss any questions you have with your health care provider. Document Released: 12/27/2015 Document Revised: 08/19/2016 Document Reviewed: 10/01/2015 Elsevier Interactive Patient Education  2017 [place] Prevention in the Home Falls can cause injuries. They can happen to people of all ages. There are many things you can do to make your home safe and to help prevent falls. What can I do on the outside of my home? Regularly fix the edges of walkways and driveways and fix any cracks. Remove anything that might make you trip as you walk through a door, such as a raised step or threshold. Trim any bushes or trees on the path to your home. Use bright outdoor lighting. Clear any walking paths of anything that might make someone trip, such as rocks or tools. Regularly check to see if handrails are loose or broken. Make sure that both sides of any steps have handrails. Any raised decks and porches should have guardrails on  the edges. Have any leaves, snow, or ice cleared regularly. Use sand or salt on walking paths during winter. Clean up any spills in your garage right away. This includes oil or grease spills. What can I do in the bathroom? Use night lights. Install grab bars by the toilet and in the tub and shower. Do not use towel bars as grab bars. Use non-skid mats or decals in the tub or shower. If you need to sit down in the shower, use a plastic, non-slip stool. Keep the floor dry. Clean up any water that spills on the floor as soon as it happens. Remove soap buildup in the tub or shower regularly. Attach bath mats securely with double-sided non-slip rug tape. Do not have throw rugs and other things on the floor that can make you trip. What can I do in the bedroom? Use night lights. Make sure that you have a light by your bed that is easy to reach. Do not use any sheets or blankets that are too big for your bed. They should not hang down onto the floor. Have a firm chair that has side arms. You can use this for  support while you get dressed. Do not have throw rugs and other things on the floor that can make you trip. What can I do in the kitchen? Clean up any spills right away. Avoid walking on wet floors. Keep items that you use a lot in easy-to-reach places. If you need to reach something above you, use a strong step stool that has a grab bar. Keep electrical cords out of the way. Do not use floor polish or wax that makes floors slippery. If you must use wax, use non-skid floor wax. Do not have throw rugs and other things on the floor that can make you trip. What can I do with my stairs? Do not leave any items on the stairs. Make sure that there are handrails on both sides of the stairs and use them. Fix handrails that are broken or loose. Make sure that handrails are as long as the stairways. Check any carpeting to make sure that it is firmly attached to the stairs. Fix any carpet that is loose  or worn. Avoid having throw rugs at the top or bottom of the stairs. If you do have throw rugs, attach them to the floor with carpet tape. Make sure that you have a light switch at the top of the stairs and the bottom of the stairs. If you do not have them, ask someone to add them for you. What else can I do to help prevent falls? Wear shoes that: Do not have high heels. Have rubber bottoms. Are comfortable and fit you well. Are closed at the toe. Do not wear sandals. If you use a stepladder: Make sure that it is fully opened. Do not climb a closed stepladder. Make sure that both sides of the stepladder are locked into place. Ask someone to hold it for you, if possible. Clearly mark and make sure that you can see: Any grab bars or handrails. First and last steps. Where the edge of each step is. Use tools that help you move around (mobility aids) if they are needed. These include: Canes. Walkers. Scooters. Crutches. Turn on the lights when you go into a dark area. Replace any light bulbs as soon as they burn out. Set up your furniture so you have a clear path. Avoid moving your furniture around. If any of your floors are uneven, fix them. If there are any pets around you, be aware of where they are. Review your medicines with your doctor. Some medicines can make you feel dizzy. This can increase your chance of falling. Ask your doctor what other things that you can do to help prevent falls. This information is not intended to replace advice given to you by your health care provider. Make sure you discuss any questions you have with your health care provider. Document Released: 09/26/2009 Document Revised: 05/07/2016 Document Reviewed: 01/04/2015 Elsevier Interactive Patient Education  2017 [employer].

## 2023-01-07 NOTE — Progress Notes (Signed)
Subjective:   Nygel Prokop is a 62 y.o. male who presents for Medicare Annual/Subsequent preventive examination. I connected with  Rodgers Likes on 01/07/23 by a audio enabled telemedicine application and verified that I am speaking with the correct person using two identifiers.  Patient Location: Home  Provider Location: Home Office  I discussed the limitations of evaluation and management by telemedicine. The patient expressed understanding and agreed to proceed.  Review of Systems     Cardiac Risk Factors include: advanced age (>5mn, >>72women);diabetes mellitus;hypertension;male gender     Objective:    Today's Vitals   01/07/23 0953  Weight: 155 lb (70.3 kg)  Height: '5\' 9"'$  (1.753 m)   Body mass index is 22.89 kg/m.     01/07/2023    9:55 AM 01/06/2022    9:56 AM 10/11/2017    9:10 AM 09/24/2014    7:45 AM 09/10/2014    9:55 AM  Advanced Directives  Does Patient Have a Medical Advance Directive? No No Yes No No  Type of AScientist, physiologicalof [place] will    Does patient want to make changes to medical advance directive?   No - Patient declined    Copy of [place] Chart?   No - copy requested    Would patient like information on creating a medical advance directive? No - Patient declined No - Patient declined       Current Medications (verified) Outpatient Encounter Medications as of 01/07/2023  Medication Sig   Accu-Chek FastClix Lancets MISC Use to check blood sugar BID and prn   Acetylcysteine 600 MG CAPS Take by mouth.   Alcohol Swabs (B-D SINGLE USE SWABS REGULAR) PADS Test BID   Blood Glucose Calibration (ACCU-CHEK AVIVA) SOLN Use with glucose meter   Blood Glucose Monitoring Suppl (ACCU-CHEK AVIVA PLUS) w/Device KIT Test BID   dapagliflozin propanediol (FARXIGA) 10 MG TABS tablet Take 1 tablet (10 mg total) by mouth daily before breakfast.   glucose blood (ACCU-CHEK AVIVA PLUS) test strip Test BID    Lancet Devices (ONE TOUCH DELICA LANCING DEV) MISC Use to check blood sugar daily   Lancets Misc. (ACCU-CHEK FASTCLIX LANCET) KIT Test BID   metFORMIN (GLUCOPHAGE) 1000 MG tablet Take 1 tablet (1,000 mg total) by mouth 2 (two) times daily with a meal.   rosuvastatin (CRESTOR) 20 MG tablet Take 1 tablet (20 mg total) by mouth daily.   No facility-administered encounter medications on file as of 01/07/2023.    Allergies (verified) Patient has no known allergies.   History: Past Medical History:  Diagnosis Date   [place]    Hyperlipidemia    Past Surgical History:  Procedure Laterality Date   NO PAST SURGERIES     Family History  Problem Relation Age of Onset   Diabetes Father 349      IDDM   Diabetes Sister 320      IDDM   Colon cancer Neg Hx    Esophageal cancer Neg Hx    Stomach cancer Neg Hx    Rectal cancer Neg Hx    Social History   Socioeconomic History   Marital status: Married    Spouse name: Not on file   Number of children: 2   Years of education: Not on file   Highest education level: Not on file  Occupational History   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  Tobacco Use   Smoking status: Never  Smokeless tobacco: Never  Vaping Use   Vaping Use: Never used  Substance and Sexual Activity   Alcohol use: No   Drug use: No   Sexual activity: Yes  Other Topics Concern   Not on file  Social History Narrative   2 sons - 6 grandchildren from them   One step - daughter who has 5 children - they all call him papa   Social Determinants of Health   Financial Resource Strain: Low Risk  (01/07/2023)   Overall Financial Resource Strain (CARDIA)    Difficulty of Paying Living Expenses: Not hard at all  Food Insecurity: No Food Insecurity (01/07/2023)   Hunger Vital Sign    Worried About Running Out of Food in the Last Year: Never true    Ran Out of Food in the Last Year: Never true  Transportation Needs: No Transportation  Needs (01/07/2023)   PRAPARE - Hydrologist (Medical): No    Lack of Transportation (Non-Medical): No  Physical Activity: Sufficiently Active (01/07/2023)   Exercise Vital Sign    Days of Exercise per Week: 5 days    Minutes of Exercise per Session: 30 min  Stress: No Stress Concern Present (01/07/2023)   [place]    Feeling of Stress : Not at all  Social Connections: [place] (01/07/2023)   Social Connection and Isolation Panel [NHANES]    Frequency of Communication with Friends and Family: More than three times a week    Frequency of Social Gatherings with Friends and Family: More than three times a week    Attends Religious Services: More than 4 times per year    Active Member of [employer] or Organizations: Yes    Attends Music therapist: More than 4 times per year    Marital Status: Married    Tobacco Counseling Counseling given: Not Answered   Clinical Intake:  Pre-visit preparation completed: Yes  Pain : No/denies pain     Nutritional Risks: None Diabetes: Yes CBG done?: No Did pt. bring in CBG monitor from home?: No  How often do you need to have someone help you when you read instructions, pamphlets, or other written materials from your doctor or pharmacy?: 1 - Never  Diabetic?yes Nutrition Risk Assessment:  Has the patient had any N/V/D within the last 2 months?  No  Does the patient have any non-healing wounds?  No  Has the patient had any unintentional weight loss or weight gain?  No   Diabetes:  Is the patient diabetic?  Yes  If diabetic, was a CBG obtained today?  No  Did the patient bring in their glucometer from home?  No  How often do you monitor your CBG's? daily.   Financial Strains and Diabetes Management:  Are you having any financial strains with the device, your supplies or your medication? No .  Does the patient want to be  seen by Chronic Care Management for management of their diabetes?  No  Would the patient like to be referred to a Nutritionist or for Diabetic Management?  No   Diabetic Exams:  Diabetic Eye Exam: Completed 10/2022 Diabetic Foot Exam: Overdue, Pt has been advised about the importance in completing this exam. Pt is scheduled for diabetic foot exam on next office visit .   Interpreter Needed?: No  Information entered by :: Jadene Pierini, LPN   Activities of Daily Living    01/07/2023  9:56 AM  In your present state of health, do you have any difficulty performing the following activities:  Hearing? 0  Vision? 0  Difficulty concentrating or making decisions? 0  Walking or climbing stairs? 0  Dressing or bathing? 0  Doing errands, shopping? 0  Preparing Food and eating ? N  Using the Toilet? N  In the past six months, have you accidently leaked urine? N  Do you have problems with loss of bowel control? N  Managing your Medications? N  Managing your Finances? N  Housekeeping or managing your Housekeeping? N    Patient Care Team: Chevis Pretty, FNP as PCP - General (Nurse Practitioner) Hilarie Fredrickson, Lajuan Lines, MD as Consulting Physician (Gastroenterology) Lavera Guise, [facility] (Pharmacist) Peter Congo, MD as Referring Physician (Ophthalmology) Alexis Frock, MD as Consulting Physician (Urology) Pyrtle, Lajuan Lines, MD as Consulting Physician (Gastroenterology)  Indicate any recent Medical Services you may have received from other than [facility] providers in the past year (date may be approximate).     Assessment:   This is a routine wellness examination for [place].  Hearing/Vision screen Vision Screening - Comments:: Wears rx glasses - up to date with routine eye exams with  [facility]   Dietary issues and exercise activities discussed: Current Exercise Habits: Home exercise routine, Type of exercise: strength training/weights;walking, Time (Minutes): 30, Frequency (Times/Week):  5, Weekly Exercise (Minutes/Week): 150, Intensity: Mild, Exercise limited by: None identified   Goals Addressed             This Visit's Progress    Exercise 150 minutes per week (moderate activity)   On track      Depression Screen    01/07/2023    9:55 AM 12/29/2022    8:04 AM 09/28/2022    9:27 AM 06/12/2022    8:46 AM 03/10/2022    8:32 AM 01/06/2022   10:26 AM 12/09/2021    8:13 AM  PHQ 2/9 Scores  PHQ - 2 Score 0 0 0 0 0 0 0  PHQ- 9 Score 0 0 0 0 0 0 0    Fall Risk    01/07/2023    9:54 AM 12/29/2022    8:04 AM 09/28/2022    9:27 AM 06/12/2022    8:46 AM 03/10/2022    8:32 AM  Fall Risk   Falls in the past year? 0 0 0 0 0  Number falls in past yr: 0      Injury with Fall? 0      Risk for fall due to : No Fall Risks      Follow up Falls prevention discussed        FALL RISK PREVENTION PERTAINING TO THE HOME:  Any stairs in or around the home? Yes  If so, are there any without handrails? No  Home free of loose throw rugs in walkways, pet beds, electrical cords, etc? Yes  Adequate lighting in your home to reduce risk of falls? Yes   ASSISTIVE DEVICES UTILIZED TO PREVENT FALLS:  Life alert? No  Use of a cane, walker or w/c? No  Grab bars in the bathroom? No  Shower chair or bench in shower? No  Elevated toilet seat or a handicapped toilet? No       10/11/2017    9:15 AM 10/11/2017    9:11 AM  MMSE - Mini Mental State Exam  Orientation to time 5 5  Orientation to Place 5 5  Registration 3 3  Attention/ Calculation 5 5  Recall 3 3  Language- name 2 objects 2 2  Language- repeat 1 1  Language- follow 3 step command 3 3  Language- read & follow direction 1 1  Write a sentence 1 1  Copy design 1   Total score 30         01/07/2023    9:56 AM  6CIT Screen  What Year? 0 points  What month? 0 points  What time? 0 points  Count back from 20 0 points  Months in reverse 0 points  Repeat phrase 0 points  Total Score 0 points     Immunizations Immunization History  Administered Date(s) Administered   Influenza,inj,Quad PF,6+ Mos 10/25/2014, 09/13/2015, 09/08/2016, 09/21/2017, 09/21/2018, 08/29/2019, 10/08/2020, 10/17/2021, 09/28/2022   Moderna SARS-COV2 Booster Vaccination 07/24/2021   Moderna Sars-Covid-2 Vaccination 02/22/2020, 03/21/2020, 11/14/2020   Pneumococcal Conjugate-13 03/18/2017   Pneumococcal Polysaccharide-23 10/25/2014   Td 12/11/2009   Tdap 12/11/2009, 11/29/2015    TDAP status: Up to date  Flu Vaccine status: Up to date  Pneumococcal vaccine status: Up to date  Covid-19 vaccine status: Completed vaccines  Qualifies for Shingles Vaccine? Yes   Zostavax completed No   Shingrix Completed?: No.    Education has been provided regarding the importance of this vaccine. Patient has been advised to call insurance company to determine out of pocket expense if they have not yet received this vaccine. Advised may also receive vaccine at local pharmacy or Health Dept. Verbalized acceptance and understanding.  Screening Tests Health Maintenance  Topic Date Due   HIV Screening  Never done   Zoster Vaccines- Shingrix (1 of 2) Never done   OPHTHALMOLOGY EXAM  12/23/2022   COVID-19 Vaccine (5 - 2023-24 season) 01/14/2023 (Originally 08/14/2022)   Diabetic kidney evaluation - Urine ACR  03/11/2023   HEMOGLOBIN A1C  06/29/2023   Diabetic kidney evaluation - eGFR measurement  12/30/2023   FOOT EXAM  12/30/2023   Medicare Annual Wellness (AWV)  01/08/2024   DTaP/Tdap/Td (4 - Td or Tdap) 11/28/2025   COLONOSCOPY (Pts 45-79yr Insurance coverage will need to be confirmed)  06/13/2027   INFLUENZA VACCINE  Completed   Hepatitis C Screening  Completed   HPV VACCINES  Aged Out    Health Maintenance  Health Maintenance Due  Topic Date Due   HIV Screening  Never done   Zoster Vaccines- Shingrix (1 of 2) Never done   OPHTHALMOLOGY EXAM  12/23/2022    Colorectal cancer screening: Type of screening:  Colonoscopy. Completed 06/12/2020. Repeat every 7 years  Lung Cancer Screening: (Low Dose CT Chest recommended if Age 692-80years, 30 pack-year currently smoking OR have quit w/in 15years.) does not qualify.   Lung Cancer Screening Referral: n/a  Additional Screening:  Hepatitis C Screening: does not qualify;   Vision Screening: Recommended annual ophthalmology exams for early detection of glaucoma and other disorders of the eye. Is the patient up to date with their annual eye exam?  Yes  Who is the provider or what is the name of the office in which the patient attends annual eye exams? [facility]  If pt is not established with a provider, would they like to be referred to a provider to establish care? No .   Dental Screening: Recommended annual dental exams for proper oral hygiene  Community Resource Referral / Chronic Care Management: CRR required this visit?  No   CCM required this visit?  No      Plan:     I have  personally reviewed and noted the following in the patient's chart:   Medical and social history Use of alcohol, tobacco or illicit drugs  Current medications and supplements including opioid prescriptions. Patient is not currently taking opioid prescriptions. Functional ability and status Nutritional status Physical activity Advanced directives List of other physicians Hospitalizations, surgeries, and ER visits in previous 12 months Vitals Screenings to include cognitive, depression, and falls Referrals and appointments  In addition, I have reviewed and discussed with patient certain preventive protocols, quality metrics, and best practice recommendations. A written personalized care plan for preventive services as well as general preventive health recommendations were provided to patient.     Daphane Shepherd, LPN   8/59/0931   Nurse Notes: Never had Chicken Pox

## 2023-02-02 ENCOUNTER — Encounter: Payer: Self-pay | Admitting: Nurse Practitioner

## 2023-02-02 ENCOUNTER — Ambulatory Visit (INDEPENDENT_AMBULATORY_CARE_PROVIDER_SITE_OTHER): Payer: Medicare HMO | Admitting: Nurse Practitioner

## 2023-02-02 VITALS — BP 136/80 | HR 88 | Temp 97.5°F | Resp 20 | Ht 69.0 in | Wt 153.0 lb

## 2023-02-02 DIAGNOSIS — E119 Type 2 diabetes mellitus without complications: Secondary | ICD-10-CM | POA: Diagnosis not present

## 2023-02-02 DIAGNOSIS — Z7984 Long term (current) use of oral hypoglycemic drugs: Secondary | ICD-10-CM | POA: Diagnosis not present

## 2023-02-02 LAB — BAYER DCA HB A1C WAIVED: HB A1C (BAYER DCA - WAIVED): 8.5 % — ABNORMAL HIGH (ref 4.8–5.6)

## 2023-02-02 NOTE — Patient Instructions (Signed)

## 2023-02-02 NOTE — Progress Notes (Signed)
   Subjective:    Patient ID: Patrick Valentine, male    DOB: [birth year], 62 y.o.   MRN: [id number]   Chief Complaint: diabetes  HPI Patient was seen on 12/29/22. He had ot been watching his diet very closely. His hgba1c was 9.4. He did not want medication change. He wanted to do diet and exercise. His fasting blood sugars have been running around 130-150. No low blood sugars Patient Active Problem List   Diagnosis Date Noted   Diabetes mellitus without complication ([place]) [id number]   BMI 26.0-26.9,adult 09/08/2016   Hyperlipidemia associated with type 2 diabetes mellitus ([place]) 08/09/2014   Retinitis pigmentosa of both eyes 07/20/2014       Review of Systems  Constitutional:  Negative for diaphoresis.  Eyes:  Negative for pain.  Respiratory:  Negative for shortness of breath.   Cardiovascular:  Negative for chest pain, palpitations and leg swelling.  Gastrointestinal:  Negative for abdominal pain.  Endocrine: Negative for polydipsia.  Skin:  Negative for rash.  Neurological:  Negative for dizziness, weakness and headaches.  Hematological:  Does not bruise/bleed easily.  All other systems reviewed and are negative.      Objective:   Physical Exam Vitals reviewed.  Constitutional:      Appearance: Normal appearance.  Cardiovascular:     Rate and Rhythm: Normal rate and regular rhythm.     Heart sounds: Normal heart sounds.  Pulmonary:     Effort: Pulmonary effort is normal.     Breath sounds: Normal breath sounds.  Skin:    General: Skin is warm.  Neurological:     General: No focal deficit present.     Mental Status: He is alert and oriented to person, place, and time.  Psychiatric:        Mood and Affect: Mood normal.        Behavior: Behavior normal.    BP 136/80   Pulse 88   Temp (!) 97.5 F (36.4 C) (Temporal)   Resp 20   Ht 5' 9"$  (1.753 m)   Wt 153 lb (69.4 kg)   SpO2 97%   BMI 22.59 kg/m   HGBA1c 8.5%       Assessment & Plan:   Patrick Valentine  in today with chief complaint of Diabetes   1. Diabetes mellitus without complication ([place]) Continue to watch carbs in diet Continue exercise - Bayer DCA Hb A1c Waived    The above assessment and management plan was discussed with the patient. The patient verbalized understanding of and has agreed to the management plan. Patient is aware to call the clinic if symptoms persist or worsen. Patient is aware when to return to the clinic for a follow-up visit. Patient educated on when it is appropriate to go to the emergency department.   Mary-Margaret Hassell Done, FNP

## 2023-02-19 ENCOUNTER — Other Ambulatory Visit (HOSPITAL_COMMUNITY)
Admission: RE | Admit: 2023-02-19 | Discharge: 2023-02-19 | Disposition: A | Discharge status: Home / Self Care | Payer: Medicare HMO | Source: Ambulatory Visit | Attending: Nurse Practitioner | Admitting: Nurse Practitioner

## 2023-02-19 ENCOUNTER — Ambulatory Visit (INDEPENDENT_AMBULATORY_CARE_PROVIDER_SITE_OTHER): Payer: Medicare HMO | Admitting: Nurse Practitioner

## 2023-02-19 ENCOUNTER — Encounter: Payer: Self-pay | Admitting: Nurse Practitioner

## 2023-02-19 VITALS — BP 132/77 | HR 94 | Temp 97.1°F | Resp 20 | Ht 69.0 in | Wt 153.0 lb

## 2023-02-19 DIAGNOSIS — L989 Disorder of the skin and subcutaneous tissue, unspecified: Secondary | ICD-10-CM | POA: Diagnosis not present

## 2023-02-19 DIAGNOSIS — L821 Other seborrheic keratosis: Secondary | ICD-10-CM | POA: Diagnosis not present

## 2023-02-19 NOTE — Progress Notes (Signed)
   Subjective:    Patient ID: Patrick Valentine, male    DOB: November 10, 1961, 62 y.o.   MRN: [id number]   Chief Complaint: removal of skin lesion  HPI Patient has skin lesion on his back that he wants removed.  Patient Active Problem List   Diagnosis Date Noted   Diabetes mellitus without complication ([place]) 35/70/1779   BMI 26.0-26.9,adult 09/08/2016   Hyperlipidemia associated with type 2 diabetes mellitus ([place]) 08/09/2014   Retinitis pigmentosa of both eyes 07/20/2014       Review of Systems  Constitutional:  Negative for diaphoresis.  Eyes:  Negative for pain.  Respiratory:  Negative for shortness of breath.   Cardiovascular:  Negative for chest pain, palpitations and leg swelling.  Gastrointestinal:  Negative for abdominal pain.  Endocrine: Negative for polydipsia.  Skin:  Negative for rash.  Neurological:  Negative for dizziness, weakness and headaches.  Hematological:  Does not bruise/bleed easily.  All other systems reviewed and are negative.      Objective:   Physical Exam Cardiovascular:     Rate and Rhythm: Normal rate and regular rhythm.     Heart sounds: Normal heart sounds.  Pulmonary:     Breath sounds: Normal breath sounds.  Skin:    General: Skin is warm.  Neurological:     General: No focal deficit present.     Mental Status: He is oriented to person, place, and time.  Psychiatric:        Mood and Affect: Mood normal.        Behavior: Behavior normal.     BP 132/77   Pulse 94   Temp (!) 97.1 F (36.2 C) (Temporal)   Resp 20   Ht 5\' 9"  (1.753 m)   Wt 153 lb (69.4 kg)   SpO2 99%   BMI 22.59 kg/m Skin excision  Date/Time: 02/19/2023 10:10 AM  Performed by: Chevis Pretty, FNP Authorized by: Hassell Done Mary-Margaret, FNP   Number of Lesions: 1 Lesion 1:    Body area: trunk   Trunk location: back   Initial size (mm): 3   Final defect size (mm): 4   Malignancy: malignancy unknown     Destruction method: scissors used for extraction      Repair type: linear closure   Closure complexity: intermediate           Assessment & Plan:  Patrick Valentine in today with chief complaint of skin lesion   1. Skin lesion of back Keep clean and dry Watch for signs of infection Suture removal in 10 days - Surgical pathology    The above assessment and management plan was discussed with the patient. The patient verbalized understanding of and has agreed to the management plan. Patient is aware to call the clinic if symptoms persist or worsen. Patient is aware when to return to the clinic for a follow-up visit. Patient educated on when it is appropriate to go to the emergency department.   Mary-Margaret Hassell Done, FNP

## 2023-02-19 NOTE — Patient Instructions (Signed)

## 2023-02-22 LAB — SURGICAL PATHOLOGY

## 2023-03-01 DIAGNOSIS — H5362 Acquired night blindness: Secondary | ICD-10-CM | POA: Diagnosis not present

## 2023-03-01 DIAGNOSIS — H542X21 Low vision right eye category 2, low vision left eye category 1: Secondary | ICD-10-CM | POA: Diagnosis not present

## 2023-03-01 DIAGNOSIS — Q8789 Other specified congenital malformation syndromes, not elsewhere classified: Secondary | ICD-10-CM | POA: Diagnosis not present

## 2023-03-02 ENCOUNTER — Ambulatory Visit (INDEPENDENT_AMBULATORY_CARE_PROVIDER_SITE_OTHER): Payer: Medicare HMO | Admitting: Nurse Practitioner

## 2023-03-02 ENCOUNTER — Encounter: Payer: Self-pay | Admitting: Nurse Practitioner

## 2023-03-02 VITALS — BP 142/78 | HR 82 | Temp 97.8°F | Ht 69.0 in | Wt 153.0 lb

## 2023-03-02 DIAGNOSIS — Z4802 Encounter for removal of sutures: Secondary | ICD-10-CM

## 2023-03-02 NOTE — Progress Notes (Signed)
   Subjective:    Patient ID: Patrick Valentine, male    DOB: 1961-04-29, 62 y.o.   MRN: [id number]   Chief Complaint: suture removal   HPI Patient had sebaceous cyst removed 10 day s ago from mid back. He is here today for suture removal. Wound edges are sightly apart. Slight yellowish drainage. Patient Active Problem List   Diagnosis Date Noted   Diabetes mellitus without complication ([place]) [id number]   BMI 26.0-26.9,adult 09/08/2016   Hyperlipidemia associated with type 2 diabetes mellitus ([place]) 08/09/2014   Retinitis pigmentosa of both eyes 07/20/2014       Review of Systems  Constitutional:  Negative for diaphoresis.  Eyes:  Negative for pain.  Respiratory:  Negative for shortness of breath.   Cardiovascular:  Negative for chest pain, palpitations and leg swelling.  Gastrointestinal:  Negative for abdominal pain.  Endocrine: Negative for polydipsia.  Skin:  Negative for rash.  Neurological:  Negative for dizziness, weakness and headaches.  Hematological:  Does not bruise/bleed easily.  All other systems reviewed and are negative.      Objective:   Physical Exam Constitutional:      Appearance: Normal appearance.  Cardiovascular:     Rate and Rhythm: Normal rate and regular rhythm.     Heart sounds: Normal heart sounds.  Pulmonary:     Breath sounds: Normal breath sounds.  Skin:    Comments: Wound edges sightly apart- granulation tissue noted Cleaned with betadine and 3 sutures removed  Neurological:     Mental Status: He is alert.   BP (!) 142/78   Pulse 82   Temp 97.8 F (36.6 C) (Skin)   Ht 5\' 9"  (1.753 m)   Wt 153 lb (69.4 kg)   BMI 22.59 kg/m    Benzoin and steri strips applied       Assessment & Plan:  Patrick Valentine in today with chief complaint of No chief complaint on file.   1. Visit for suture removal Do not pick steri strips off Wound is open slightly and will av eto heal from inside out Keep clean and dry Watch for signs of  infection    The above assessment and management plan was discussed with the patient. The patient verbalized understanding of and has agreed to the management plan. Patient is aware to call the clinic if symptoms persist or worsen. Patient is aware when to return to the clinic for a follow-up visit. Patient educated on when it is appropriate to go to the emergency department.   Mary-Margaret Hassell Done, FNP

## 2023-03-22 ENCOUNTER — Encounter: Payer: Self-pay | Admitting: Nurse Practitioner

## 2023-03-22 ENCOUNTER — Ambulatory Visit (INDEPENDENT_AMBULATORY_CARE_PROVIDER_SITE_OTHER): Payer: Medicare HMO | Admitting: Nurse Practitioner

## 2023-03-22 VITALS — BP 137/85 | HR 90 | Temp 98.0°F | Resp 20 | Ht 69.0 in | Wt 151.0 lb

## 2023-03-22 DIAGNOSIS — E1169 Type 2 diabetes mellitus with other specified complication: Secondary | ICD-10-CM | POA: Diagnosis not present

## 2023-03-22 DIAGNOSIS — E119 Type 2 diabetes mellitus without complications: Secondary | ICD-10-CM

## 2023-03-22 DIAGNOSIS — Z6826 Body mass index (BMI) 26.0-26.9, adult: Secondary | ICD-10-CM

## 2023-03-22 DIAGNOSIS — E785 Hyperlipidemia, unspecified: Secondary | ICD-10-CM | POA: Diagnosis not present

## 2023-03-22 DIAGNOSIS — Z6822 Body mass index (BMI) 22.0-22.9, adult: Secondary | ICD-10-CM | POA: Diagnosis not present

## 2023-03-22 LAB — CMP14+EGFR

## 2023-03-22 LAB — CBC WITH DIFFERENTIAL/PLATELET
Hemoglobin: 15.7 g/dL (ref 13.0–17.7)
Immature Granulocytes: 0 %
MCH: 30.4 pg (ref 26.6–33.0)
MCV: 91 fL (ref 79–97)
Monocytes: 7 %
Neutrophils: 67 %
RBC: 5.17 x10E6/uL (ref 4.14–5.80)
RDW: 13.3 % (ref 11.6–15.4)

## 2023-03-22 LAB — LIPID PANEL

## 2023-03-22 LAB — BAYER DCA HB A1C WAIVED: HB A1C (BAYER DCA - WAIVED): 6.9 % — ABNORMAL HIGH (ref 4.8–5.6)

## 2023-03-22 NOTE — Progress Notes (Signed)
Subjective:    Patient ID: Patrick Valentine, male    DOB: 1960-12-30, 62 y.o.   MRN: [id number]   Chief Complaint: medical management of chronic issues     HPI:  Patrick Valentine is a 63 y.o. who identifies as a male who was assigned male at birth.   Social history: Lives with: wife Work history: retired   Water engineer in today for follow up of the following chronic medical issues:  1. Hyperlipidemia associated with type 2 diabetes mellitus Does try to watch diet and he has been walking daily Lab Results  Component Value Date   CHOL 184 12/29/2022   HDL 44 12/29/2022   LDLCALC 109 (H) 12/29/2022   TRIG 175 (H) 12/29/2022   CHOLHDL 4.2 12/29/2022     2. Diabetes mellitus without complication Fasting blood sugars have been running around 110-140. Denies low blood sugars. Patient has refused medication changes. Said he could get A1c down with diet an dexercise. Lab Results  Component Value Date   HGBA1C 8.5 (H) 02/02/2023     3. BMI 26.0-26.9,adult  No recent weight changes Wt Readings from Last 3 Encounters:  03/22/23 151 lb (68.5 kg)  03/02/23 153 lb (69.4 kg)  02/19/23 153 lb (69.4 kg)   BMI Readings from Last 3 Encounters:  03/22/23 22.30 kg/m  03/02/23 22.59 kg/m  02/19/23 22.59 kg/m     New complaints: None today  No Known Allergies Outpatient Encounter Medications as of 03/22/2023  Medication Sig   Accu-Chek FastClix Lancets MISC Use to check blood sugar BID and prn   Acetylcysteine 600 MG CAPS Take by mouth.   Alcohol Swabs (B-D SINGLE USE SWABS REGULAR) PADS Test BID   Blood Glucose Calibration (ACCU-CHEK AVIVA) SOLN Use with glucose meter   Blood Glucose Monitoring Suppl (ACCU-CHEK AVIVA PLUS) w/Device KIT Test BID   dapagliflozin propanediol (FARXIGA) 10 MG TABS tablet Take 1 tablet (10 mg total) by mouth daily before breakfast.   glucose blood (ACCU-CHEK AVIVA PLUS) test strip Test BID   Lancet Devices (ONE TOUCH DELICA LANCING DEV) MISC Use to  check blood sugar daily   Lancets Misc. (ACCU-CHEK FASTCLIX LANCET) KIT Test BID   metFORMIN (GLUCOPHAGE) 1000 MG tablet Take 1 tablet (1,000 mg total) by mouth 2 (two) times daily with a meal.   rosuvastatin (CRESTOR) 20 MG tablet Take 1 tablet (20 mg total) by mouth daily.   No facility-administered encounter medications on file as of 03/22/2023.    Past Surgical History:  Procedure Laterality Date   NO PAST SURGERIES      Family History  Problem Relation Age of Onset   Diabetes Father 30       IDDM   Diabetes Sister 44       IDDM   Colon cancer Neg Hx    Esophageal cancer Neg Hx    Stomach cancer Neg Hx    Rectal cancer Neg Hx       Controlled substance contract: n/a     Review of Systems  Constitutional:  Negative for diaphoresis.  Eyes:  Negative for pain.  Respiratory:  Negative for shortness of breath.   Cardiovascular:  Negative for chest pain, palpitations and leg swelling.  Gastrointestinal:  Negative for abdominal pain.  Endocrine: Negative for polydipsia.  Skin:  Negative for rash.  Neurological:  Negative for dizziness, weakness and headaches.  Hematological:  Does not bruise/bleed easily.  All other systems reviewed and are negative.      Objective:  Physical Exam Vitals and nursing note reviewed.  Constitutional:      Appearance: Normal appearance. He is well-developed.  HENT:     Head: Normocephalic.     Nose: Nose normal.     Mouth/Throat:     Mouth: Mucous membranes are moist.     Pharynx: Oropharynx is clear.  Eyes:     Pupils: Pupils are equal, round, and reactive to light.  Neck:     Thyroid: No thyroid mass or thyromegaly.     Vascular: No carotid bruit or JVD.     Trachea: Phonation normal.  Cardiovascular:     Rate and Rhythm: Normal rate and regular rhythm.  Pulmonary:     Effort: Pulmonary effort is normal. No respiratory distress.     Breath sounds: Normal breath sounds.  Abdominal:     General: Bowel sounds are normal.      Palpations: Abdomen is soft.     Tenderness: There is no abdominal tenderness.  Musculoskeletal:        General: Normal range of motion.     Cervical back: Normal range of motion and neck supple.  Lymphadenopathy:     Cervical: No cervical adenopathy.  Skin:    General: Skin is warm and dry.  Neurological:     Mental Status: He is alert and oriented to person, place, and time.  Psychiatric:        Behavior: Behavior normal.        Thought Content: Thought content normal.        Judgment: Judgment normal.     BP 137/85   Pulse 90   Temp 98 F (36.7 C) (Temporal)   Resp 20   Ht 5\' 9"  (1.753 m)   Wt 151 lb (68.5 kg)   SpO2 100%   BMI 22.30 kg/m   HGBA1c 6.9%      Assessment & Plan:   Patrick Valentine comes in today with chief complaint of Medical Management of Chronic Issues   Diagnosis and orders addressed:  1. Hyperlipidemia associated with type 2 diabetes mellitus Low fat diet Continue daily exercise - Lipid panel  2. Diabetes mellitus without complication Continue to watch carbs in diet - Bayer DCA Hb A1c Waived - Microalbumin / creatinine urine ratio  3. BMI 26.0-26.9,adult Discussed diet and exercise for person with BMI >25 Will recheck weight in 3-6 months  - CMP14+EGFR - CBC with Differential/Platelet   Labs pending Health Maintenance reviewed Diet and exercise encouraged  Follow up plan: 3 months   Mary-Margaret Daphine Deutscher, FNP

## 2023-03-22 NOTE — Patient Instructions (Signed)

## 2023-03-23 LAB — CBC WITH DIFFERENTIAL/PLATELET
Basophils Absolute: 0 10*3/uL (ref 0.0–0.2)
Basos: 1 %
EOS (ABSOLUTE): 0.1 10*3/uL (ref 0.0–0.4)
Eos: 1 %
Hematocrit: 46.8 % (ref 37.5–51.0)
Immature Grans (Abs): 0 10*3/uL (ref 0.0–0.1)
Lymphocytes Absolute: 1.9 10*3/uL (ref 0.7–3.1)
Lymphs: 24 %
MCHC: 33.5 g/dL (ref 31.5–35.7)
Monocytes Absolute: 0.5 10*3/uL (ref 0.1–0.9)
Neutrophils Absolute: 5.5 10*3/uL (ref 1.4–7.0)
Platelets: 308 10*3/uL (ref 150–450)
WBC: 8 10*3/uL (ref 3.4–10.8)

## 2023-03-23 LAB — CMP14+EGFR
Albumin/Globulin Ratio: 2.2 (ref 1.2–2.2)
Albumin: 4.9 g/dL (ref 3.9–4.9)
Alkaline Phosphatase: 70 IU/L (ref 44–121)
BUN/Creatinine Ratio: 15 (ref 10–24)
Bilirubin Total: 0.4 mg/dL (ref 0.0–1.2)
Creatinine, Ser: 0.92 mg/dL (ref 0.76–1.27)
Potassium: 5.3 mmol/L — ABNORMAL HIGH (ref 3.5–5.2)
Sodium: 140 mmol/L (ref 134–144)
Total Protein: 7.1 g/dL (ref 6.0–8.5)
eGFR: 94 mL/min/{1.73_m2} (ref >59)

## 2023-03-23 LAB — LIPID PANEL
Chol/HDL Ratio: 3.5 ratio (ref 0.0–5.0)
Cholesterol, Total: 174 mg/dL (ref 100–199)
HDL: 50 mg/dL (ref >39)
LDL Chol Calc (NIH): 104 mg/dL — ABNORMAL HIGH (ref 0–99)
VLDL Cholesterol Cal: 20 mg/dL (ref 5–40)

## 2023-03-23 LAB — MICROALBUMIN / CREATININE URINE RATIO
Creatinine, Urine: 13.7 mg/dL
Microalb/Creat Ratio: <22 mg/g creat (ref 0–29)
Microalbumin, Urine: <3 ug/mL

## 2023-03-25 ENCOUNTER — Other Ambulatory Visit: Payer: Self-pay

## 2023-03-25 MED ORDER — ACCU-CHEK FASTCLIX LANCETS MISC: 5 refills | Status: DC

## 2023-04-05 ENCOUNTER — Other Ambulatory Visit: Payer: Self-pay | Admitting: Family Medicine

## 2023-04-05 DIAGNOSIS — E1169 Type 2 diabetes mellitus with other specified complication: Secondary | ICD-10-CM

## 2023-04-05 DIAGNOSIS — E119 Type 2 diabetes mellitus without complications: Secondary | ICD-10-CM

## 2023-04-05 MED ORDER — ROSUVASTATIN CALCIUM 20 MG PO TABS
20.0000 mg | ORAL_TABLET | Freq: Every day | ORAL | 1 refills | Status: DC
Start: 2023-04-05 — End: 2023-06-22

## 2023-04-05 MED ORDER — METFORMIN HCL 1000 MG PO TABS
1000.0000 mg | ORAL_TABLET | Freq: Two times a day (BID) | ORAL | 1 refills | Status: DC
Start: 2023-04-05 — End: 2023-06-22

## 2023-06-11 DIAGNOSIS — H5362 Acquired night blindness: Secondary | ICD-10-CM | POA: Diagnosis not present

## 2023-06-11 DIAGNOSIS — H468 Other optic neuritis: Secondary | ICD-10-CM | POA: Diagnosis not present

## 2023-06-11 DIAGNOSIS — H53453 Other localized visual field defect, bilateral: Secondary | ICD-10-CM | POA: Diagnosis not present

## 2023-06-11 DIAGNOSIS — H543 Unqualified visual loss, both eyes: Secondary | ICD-10-CM | POA: Diagnosis not present

## 2023-06-11 DIAGNOSIS — H3552 Pigmentary retinal dystrophy: Secondary | ICD-10-CM | POA: Diagnosis not present

## 2023-06-11 DIAGNOSIS — Z83518 Family history of other specified eye disorder: Secondary | ICD-10-CM | POA: Diagnosis not present

## 2023-06-11 LAB — HM DIABETES EYE EXAM

## 2023-06-21 ENCOUNTER — Telehealth: Payer: Self-pay

## 2023-06-21 DIAGNOSIS — E119 Type 2 diabetes mellitus without complications: Secondary | ICD-10-CM

## 2023-06-21 NOTE — Telephone Encounter (Signed)
Patient need Farxiga sent to [facility]. States that you helped them get this before

## 2023-06-22 ENCOUNTER — Ambulatory Visit (INDEPENDENT_AMBULATORY_CARE_PROVIDER_SITE_OTHER): Payer: Medicare HMO | Admitting: Nurse Practitioner

## 2023-06-22 ENCOUNTER — Other Ambulatory Visit: Payer: Self-pay | Admitting: Pharmacist

## 2023-06-22 ENCOUNTER — Encounter: Payer: Self-pay | Admitting: Nurse Practitioner

## 2023-06-22 VITALS — BP 124/83 | HR 88 | Temp 97.9°F | Resp 20 | Ht 69.0 in | Wt 151.0 lb

## 2023-06-22 DIAGNOSIS — R972 Elevated prostate specific antigen [PSA]: Secondary | ICD-10-CM

## 2023-06-22 DIAGNOSIS — Z6826 Body mass index (BMI) 26.0-26.9, adult: Secondary | ICD-10-CM | POA: Diagnosis not present

## 2023-06-22 DIAGNOSIS — E785 Hyperlipidemia, unspecified: Secondary | ICD-10-CM

## 2023-06-22 DIAGNOSIS — Z7984 Long term (current) use of oral hypoglycemic drugs: Secondary | ICD-10-CM

## 2023-06-22 DIAGNOSIS — Z125 Encounter for screening for malignant neoplasm of prostate: Secondary | ICD-10-CM | POA: Diagnosis not present

## 2023-06-22 DIAGNOSIS — E1169 Type 2 diabetes mellitus with other specified complication: Secondary | ICD-10-CM | POA: Diagnosis not present

## 2023-06-22 DIAGNOSIS — E119 Type 2 diabetes mellitus without complications: Secondary | ICD-10-CM

## 2023-06-22 LAB — CBC WITH DIFFERENTIAL/PLATELET
Basophils Absolute: 0 10*3/uL (ref 0.0–0.2)
Hematocrit: 47.6 % (ref 37.5–51.0)
Hemoglobin: 15.9 g/dL (ref 13.0–17.7)
Immature Granulocytes: 0 %
Lymphocytes Absolute: 2.2 10*3/uL (ref 0.7–3.1)
Lymphs: 33 %
MCH: 30.3 pg (ref 26.6–33.0)
MCHC: 33.4 g/dL (ref 31.5–35.7)
Monocytes Absolute: 0.6 10*3/uL (ref 0.1–0.9)
Neutrophils Absolute: 3.7 10*3/uL (ref 1.4–7.0)
Neutrophils: 55 %
RBC: 5.24 x10E6/uL (ref 4.14–5.80)
RDW: 13.6 % (ref 11.6–15.4)
WBC: 6.8 10*3/uL (ref 3.4–10.8)

## 2023-06-22 LAB — CMP14+EGFR

## 2023-06-22 LAB — LIPID PANEL

## 2023-06-22 LAB — PSA, TOTAL AND FREE

## 2023-06-22 LAB — BAYER DCA HB A1C WAIVED: HB A1C (BAYER DCA - WAIVED): 8 % — ABNORMAL HIGH (ref 4.8–5.6)

## 2023-06-22 MED ORDER — ROSUVASTATIN CALCIUM 20 MG PO TABS
20.0000 mg | ORAL_TABLET | Freq: Every day | ORAL | 1 refills | Status: DC
Start: 2023-06-22 — End: 2024-01-06

## 2023-06-22 MED ORDER — DAPAGLIFLOZIN PROPANEDIOL 10 MG PO TABS
10.0000 mg | ORAL_TABLET | Freq: Every day | ORAL | 5 refills | Status: DC
Start: 2023-06-22 — End: 2024-01-04

## 2023-06-22 MED ORDER — METFORMIN HCL 1000 MG PO TABS
1000.0000 mg | ORAL_TABLET | Freq: Two times a day (BID) | ORAL | 1 refills | Status: DC
Start: 2023-06-22 — End: 2024-01-06

## 2023-06-22 NOTE — Progress Notes (Signed)
Subjective:    Patient ID: Patrick Valentine, male    DOB: 29-Jun-1961, 62 y.o.   MRN: [id number]   Chief Complaint: medical management of chronic issues     HPI:  Patrick Valentine is a 62 y.o. who identifies as a male who was assigned male at birth.   Social history: Lives with: wife Work history: retired   Water engineer in today for follow up of the following chronic medical issues:  1. Hyperlipidemia associated with type 2 diabetes mellitus (HCC) Does watch diet and exercises daily. Lab Results  Component Value Date   CHOL 174 03/22/2023   HDL 50 03/22/2023   LDLCALC 104 (H) 03/22/2023   TRIG 111 03/22/2023   CHOLHDL 3.5 03/22/2023     2. Diabetes mellitus treated with oral medication (HCC) Fasting blood sugars are running around 120-140. No  low blood sugars Lab Results  Component Value Date   HGBA1C 6.9 (H) 03/22/2023     3. BMI 26.0-26.9,adult No recent weight changes. Wt Readings from Last 3 Encounters:  06/22/23 151 lb (68.5 kg)  03/22/23 151 lb (68.5 kg)  03/02/23 153 lb (69.4 kg)   BMI Readings from Last 3 Encounters:  06/22/23 22.30 kg/m  03/22/23 22.30 kg/m  03/02/23 22.59 kg/m      New complaints: None today  No Known Allergies Outpatient Encounter Medications as of 06/22/2023  Medication Sig   Accu-Chek FastClix Lancets MISC Use to check blood sugar BID and prn   Acetylcysteine 600 MG CAPS Take by mouth.   Alcohol Swabs (B-D SINGLE USE SWABS REGULAR) PADS Test BID   Blood Glucose Calibration (ACCU-CHEK AVIVA) SOLN Use with glucose meter   Blood Glucose Monitoring Suppl (ACCU-CHEK AVIVA PLUS) w/Device KIT Test BID   dapagliflozin propanediol (FARXIGA) 10 MG TABS tablet Take 1 tablet (10 mg total) by mouth daily before breakfast.   glucose blood (ACCU-CHEK AVIVA PLUS) test strip Test BID   Lancet Devices (ONE TOUCH DELICA LANCING DEV) MISC Use to check blood sugar daily   Lancets Misc. (ACCU-CHEK FASTCLIX LANCET) KIT Test BID   metFORMIN  (GLUCOPHAGE) 1000 MG tablet Take 1 tablet (1,000 mg total) by mouth 2 (two) times daily with a meal.   rosuvastatin (CRESTOR) 20 MG tablet Take 1 tablet (20 mg total) by mouth daily.   No facility-administered encounter medications on file as of 06/22/2023.    Past Surgical History:  Procedure Laterality Date   NO PAST SURGERIES      Family History  Problem Relation Age of Onset   Diabetes Father 6       IDDM   Diabetes Sister 27       IDDM   Colon cancer Neg Hx    Esophageal cancer Neg Hx    Stomach cancer Neg Hx    Rectal cancer Neg Hx       Controlled substance contract: n/a     Review of Systems  Constitutional:  Negative for diaphoresis.  Eyes:  Negative for pain.  Respiratory:  Negative for shortness of breath.   Cardiovascular:  Negative for chest pain, palpitations and leg swelling.  Gastrointestinal:  Negative for abdominal pain.  Endocrine: Negative for polydipsia.  Skin:  Negative for rash.  Neurological:  Negative for dizziness, weakness and headaches.  Hematological:  Does not bruise/bleed easily.  All other systems reviewed and are negative.      Objective:   Physical Exam Vitals and nursing note reviewed.  Constitutional:      Appearance: Normal appearance.  He is well-developed.  HENT:     Head: Normocephalic.     Nose: Nose normal.     Mouth/Throat:     Mouth: Mucous membranes are moist.     Pharynx: Oropharynx is clear.  Eyes:     Pupils: Pupils are equal, round, and reactive to light.  Neck:     Thyroid: No thyroid mass or thyromegaly.     Vascular: No carotid bruit or JVD.     Trachea: Phonation normal.  Cardiovascular:     Rate and Rhythm: Normal rate and regular rhythm.  Pulmonary:     Effort: Pulmonary effort is normal. No respiratory distress.     Breath sounds: Normal breath sounds.  Abdominal:     General: Bowel sounds are normal.     Palpations: Abdomen is soft.     Tenderness: There is no abdominal tenderness.   Musculoskeletal:        General: Normal range of motion.     Cervical back: Normal range of motion and neck supple.  Lymphadenopathy:     Cervical: No cervical adenopathy.  Skin:    General: Skin is warm and dry.  Neurological:     Mental Status: He is alert and oriented to person, place, and time.  Psychiatric:        Behavior: Behavior normal.        Thought Content: Thought content normal.        Judgment: Judgment normal.    BP 124/83   Pulse 88   Temp 97.9 F (36.6 C) (Temporal)   Resp 20   Ht 5\' 9"  (1.753 m)   Wt 151 lb (68.5 kg)   SpO2 98%   BMI 22.30 kg/m    HGBA1c 8.0%     Assessment & Plan:   Patrick Valentine comes in today with chief complaint of Medical Management of Chronic Issues   Diagnosis and orders addressed:  1. Hyperlipidemia associated with type 2 diabetes mellitus (HCC) Low fat diet - Lipid panel - rosuvastatin (CRESTOR) 20 MG tablet; Take 1 tablet (20 mg total) by mouth daily.  Dispense: 90 tablet; Refill: 1  2. Diabetes mellitus treated with oral medication (HCC) Stricter carb counting - Bayer DCA Hb A1c Waived - CBC with Differential/Platelet - CMP14+EGFR - metFORMIN (GLUCOPHAGE) 1000 MG tablet; Take 1 tablet (1,000 mg total) by mouth 2 (two) times daily with a meal.  Dispense: 180 tablet; Refill: 1  3. BMI 26.0-26.9,adult Discussed diet and exercise for person with BMI >25 Will recheck weight in 3-6 months   4. Elevated PSA Labs pending - PSA, total and free   Labs pending Health Maintenance reviewed Diet and exercise encouraged  Follow up plan: 3 months   Patrick Daphine Deutscher, FNP

## 2023-06-22 NOTE — Patient Instructions (Signed)

## 2023-06-22 NOTE — Progress Notes (Signed)
   06/22/2023 Name: Muhsin Doris MRN: [id number] DOB: Jun 08, 1961  TYPE 2 DIABETES   FARXIGA 10MG  DAILY refills ESCRIBED/sent to [facility] pharmacy ([facility] ORDER pharmacy for [facility] patient assistance program) #90 W/ REFILLS  FARXIGA WILL DELIVER TO PATIENT'S HOME ADDRESS PATIENT CAN CALL TO CHECK ON DELIVERY AS NEEDED  #[phone]   **this is not a billable visit**    Kieth Brightly, PharmD, BCACP Clinical Pharmacist, [facility]

## 2023-06-23 LAB — CBC WITH DIFFERENTIAL/PLATELET
Basos: 0 %
EOS (ABSOLUTE): 0.2 10*3/uL (ref 0.0–0.4)
Eos: 3 %
Immature Grans (Abs): 0 10*3/uL (ref 0.0–0.1)
MCV: 91 fL (ref 79–97)
Monocytes: 9 %
Platelets: 281 10*3/uL (ref 150–450)

## 2023-06-23 LAB — CMP14+EGFR
Alkaline Phosphatase: 65 IU/L (ref 44–121)
BUN: 11 mg/dL (ref 8–27)
Bilirubin Total: 0.5 mg/dL (ref 0.0–1.2)
Calcium: 10.4 mg/dL — ABNORMAL HIGH (ref 8.6–10.2)
Globulin, Total: 2.4 g/dL (ref 1.5–4.5)
Potassium: 4.9 mmol/L (ref 3.5–5.2)
Sodium: 137 mmol/L (ref 134–144)
Total Protein: 7.2 g/dL (ref 6.0–8.5)

## 2023-06-23 LAB — PSA, TOTAL AND FREE
PSA, Free Pct: 13.9 %
Prostate Specific Ag, Serum: 6.4 ng/mL — ABNORMAL HIGH (ref 0.0–4.0)

## 2023-06-23 LAB — LIPID PANEL
Chol/HDL Ratio: 3.8 ratio (ref 0.0–5.0)
HDL: 48 mg/dL (ref >39)

## 2023-08-18 DIAGNOSIS — R519 Headache, unspecified: Secondary | ICD-10-CM | POA: Diagnosis not present

## 2023-08-18 DIAGNOSIS — Z20822 Contact with and (suspected) exposure to covid-19: Secondary | ICD-10-CM | POA: Diagnosis not present

## 2023-08-18 DIAGNOSIS — U071 COVID-19: Secondary | ICD-10-CM | POA: Diagnosis not present

## 2023-10-08 ENCOUNTER — Encounter: Payer: Self-pay | Admitting: Nurse Practitioner

## 2023-10-08 ENCOUNTER — Ambulatory Visit (INDEPENDENT_AMBULATORY_CARE_PROVIDER_SITE_OTHER): Payer: Medicare HMO | Admitting: Nurse Practitioner

## 2023-10-08 VITALS — BP 124/81 | HR 90 | Temp 98.9°F | Resp 20 | Ht 69.0 in | Wt 145.0 lb

## 2023-10-08 DIAGNOSIS — Z7984 Long term (current) use of oral hypoglycemic drugs: Secondary | ICD-10-CM

## 2023-10-08 DIAGNOSIS — E1169 Type 2 diabetes mellitus with other specified complication: Secondary | ICD-10-CM

## 2023-10-08 DIAGNOSIS — E785 Hyperlipidemia, unspecified: Secondary | ICD-10-CM | POA: Diagnosis not present

## 2023-10-08 DIAGNOSIS — E119 Type 2 diabetes mellitus without complications: Secondary | ICD-10-CM | POA: Diagnosis not present

## 2023-10-08 DIAGNOSIS — Z6826 Body mass index (BMI) 26.0-26.9, adult: Secondary | ICD-10-CM | POA: Diagnosis not present

## 2023-10-08 DIAGNOSIS — Z23 Encounter for immunization: Secondary | ICD-10-CM | POA: Diagnosis not present

## 2023-10-08 LAB — BAYER DCA HB A1C WAIVED: HB A1C (BAYER DCA - WAIVED): 8.6 % — ABNORMAL HIGH (ref 4.8–5.6)

## 2023-10-08 MED ORDER — LANTUS SOLOSTAR 100 UNIT/ML ~~LOC~~ SOPN: 10.0000 [IU] | PEN_INJECTOR | Freq: Every day | SUBCUTANEOUS | 99 refills | Status: DC

## 2023-10-08 MED ORDER — PEN NEEDLES 33G X 4 MM MISC: 1.0000 | Freq: Every day | 1 refills | Status: DC

## 2023-10-08 NOTE — Patient Instructions (Signed)
Insulin Glargine Injection What is this medication? INSULIN GLARGINE (IN su lin GLAR geen) treats diabetes. It works by increasing insulin levels in your body, which decreases your blood sugar (glucose). It belongs to a group of medications called long-acting insulins or basal insulins. Changes to diet and exercise are often combined with this medication. This medicine may be used for other purposes; ask your health care provider or pharmacist if you have questions. COMMON BRAND NAME(S): BASAGLAR, [employer], Basaglar Tempo, Lantus, Lantus SoloStar, REZVOGLAR, Semglee, Toujeo Max SoloStar, [employer] What should I tell my care team before I take this medication? They need to know if you have any of these conditions: Episodes of low blood sugar Eye disease, vision problems Kidney disease Liver disease An unusual or allergic reaction to insulin, metacresol, other medications, foods, dyes, or preservatives Pregnant or trying to get pregnant Breast-feeding How should I use this medication? This medication is injected under the skin. Y Use it exactly as directed. It is important to follow the directions given to you by your care team. This insulin should never be mixed in the same syringe with other insulins before injection. You will be taught how to use this medication and how to adjust doses for activities and illness. Do not use more insulin than prescribed. Do not use it more or less often than prescribed. Always check the appearance of your insulin before using it. This insulin should be clear and colorless like water. Do not use it if it is cloudy, thickened, colored, or has solid particles in it. If you use a pen, be sure to take off the outer needle cover before using the dose. It is important that you put your used needles and syringes in a special sharps container. Do not put them in a trash can. If you do not have a sharps container, call your pharmacist or care team to get  one. This medication comes with INSTRUCTIONS FOR USE. Ask your pharmacist for directions on how to use this medication. Read the information carefully. Talk to your pharmacist or care team if you have questions. Talk to your care team about the use of this medication in children. While it may be prescribed for children as young as 6 years for selected conditions, precautions do apply. Overdosage: If you think you have taken too much of this medicine contact a poison control center or emergency room at once. NOTE: This medicine is only for you. Do not share this medicine with others. What if I miss a dose? It is important not to miss a dose. Your care team should discuss a plan for missed doses with you. If you do miss a dose, follow their plan. Do not take double doses. What may interact with this medication? Some medications may affect your blood sugar levels or hide the symptoms of low blood sugar (hypoglycemia). Talk with your care team about all of the medications you take. They may suggest changes to your insulin dose or checking your blood sugar levels more often. Medications that may affect your blood sugar levels include: Alcohol Certain antibiotics, such as ciprofloxacin, levofloxacin, sulfamethoxazole; trimethoprim Certain medications for blood pressure or heart disease, such as benazepril, enalapril, lisinopril, losartan, valsartan Certain medications for mental health conditions, such as fluoxetine or olanzapine Diuretics, such as hydrochlorothiazide (HCTZ) Estrogen and progestin hormones Other medications for diabetes Steroid medications, such as prednisone or cortisone Testosterone Thyroid hormones Medications that may mask symptoms of low blood sugar include: Beta blockers, such as atenolol,  metoprolol, propranolol Clonidine Guanethidine Reserpine This list may not describe all possible interactions. Give your health care provider a list of all the medicines, herbs,  non-prescription drugs, or dietary supplements you use. Also tell them if you smoke, drink alcohol, or use illegal drugs. Some items may interact with your medicine. What should I watch for while using this medication? Visit your care team for regular checks on your progress. Your care team will monitor your hemoglobin A1C. This is a simple blood test. It measures your average blood sugar level over the past 3 months. It will help you and your care team manage your diabetes. Learn how to check your blood sugar levels. Know the symptoms of low and high blood sugar and how to manage them. Always carry a source of quick sugar with you for symptoms of low blood sugar. Examples include glucose tablets, juice, or sugar candy. Teach your family members, friends, and others how to help you if your blood sugar is too low and you are not awake enough to treat it. Talk to your care team if you have high blood sugar. You may need to adjust your insulin dose. Many factors can cause high blood sugar, including illness, stress, or a change in activity. Do not skip meals. Ask your care team if you should avoid alcohol. Many cough and cold products contain sugar or alcohol. These can affect blood sugar levels. Make sure that you have the correct syringe for the type of insulin you use. Do not change the brand or type of insulin or syringe unless your care team tells you to. Switching insulin brand or type can affect your blood sugar enough to cause serious adverse effects. Always keep an extra supply of insulin and related supplies on hand. Only use syringes once. Get rid of syringes and needles in a closed container to prevent accidental needle sticks. Do not share insulin pens or cartridges with anyone, even if the needle is changed. Each pen should only be used by one person. Sharing could cause an infection. Do not use a syringe to take insulin out of an insulin pen. Doing this may result in the wrong dose of  insulin. Wear a medical ID bracelet or chain. Carry a card that describes your condition. List the medications and doses you take on the card. What side effects may I notice from receiving this medication? Side effects that you should report to your care team as soon as possible: Allergic reactions--skin rash, itching, hives, swelling of the face, lips, tongue, or throat Low blood sugar (hypoglycemia)--tremors or shaking, anxiety, sweating, cold or clammy skin, confusion, dizziness, rapid heartbeat Low potassium level--muscle pain or cramps, unusual weakness or fatigue, fast or irregular heartbeat, constipation Side effects that usually do not require medical attention (report to your care team if they continue or are bothersome): Lipodystrophy--hardening or scarring of tissue at injection site Pain, redness, or irritation at injection site Weight gain This list may not describe all possible side effects. Call your doctor for medical advice about side effects. You may report side effects to FDA at 1-800-FDA-1088. Where should I keep my medication? Keep out of the reach of children and pets. Storage and expiration dates for different insulin products may vary. Check the label for information on how to store your insulin. Talk to your care team if you have any questions. Do not freeze. Protect from direct light and heat. Do not use insulin if it is exposed to temperatures above 37 degrees C (  98.6 degrees F). Do not use insulin if it has been frozen. Multi-dose vials Unopened (not in-use): Store at room temperature up to 30 degrees C (86 degrees F) for up to 28 days, or refrigerated until the expiration date. Opened (in-use): Store at room temperature or refrigerated for up to 28 days. Prefilled pens Unopened (not in-use): Store at room temperature up to 30 degrees C (86 degrees F) for up to 28 days, or refrigerated until the expiration date. Opened (in-use): Store at room temperature for up to 28  days. Do not refrigerate. To get rid of medications that are no longer needed or have expired: Take the medication to a medication take-back program. Check with your pharmacy or law enforcement to find a location. If you cannot return the medication, ask your pharmacist or care team how to get rid of this medication safely. NOTE: This sheet is a summary. It may not cover all possible information. If you have questions about this medicine, talk to your doctor, pharmacist, or health care provider.  2024 Elsevier/Gold Standard (2022-09-28 00:00:00)

## 2023-10-08 NOTE — Progress Notes (Signed)
Subjective:    Patient ID: Patrick Valentine, male    DOB: 1961/07/02, 62 y.o.   MRN: [id number]   Chief Complaint: medical management of chronic issues     HPI:  Patrick Valentine is a 62 y.o. who identifies as a male who was assigned male at birth.   Social history: Lives with: wife Work history: retired   Water engineer in today for follow up of the following chronic medical issues:  1. Hyperlipidemia associated with type 2 diabetes mellitus (HCC) Does watch diet and exercises daily. Lab Results  Component Value Date   CHOL 183 06/22/2023   HDL 48 06/22/2023   LDLCALC 114 (H) 06/22/2023   TRIG 119 06/22/2023   CHOLHDL 3.8 06/22/2023     2. Diabetes mellitus treated with oral medication (HCC) Fasting blood sugars are running around 150-170. Lab Results  Component Value Date   HGBA1C 8.0 (H) 06/22/2023     3. BMI 26.0-26.9,adult Weight is down 6lbs Wt Readings from Last 3 Encounters:  10/08/23 145 lb (65.8 kg)  06/22/23 151 lb (68.5 kg)  03/22/23 151 lb (68.5 kg)   BMI Readings from Last 3 Encounters:  10/08/23 21.41 kg/m  06/22/23 22.30 kg/m  03/22/23 22.30 kg/m      New complaints: None today  No Known Allergies Outpatient Encounter Medications as of 10/08/2023  Medication Sig   Accu-Chek FastClix Lancets MISC Use to check blood sugar BID and prn   Acetylcysteine 600 MG CAPS Take by mouth.   Alcohol Swabs (B-D SINGLE USE SWABS REGULAR) PADS Test BID   Blood Glucose Calibration (ACCU-CHEK AVIVA) SOLN Use with glucose meter   Blood Glucose Monitoring Suppl (ACCU-CHEK AVIVA PLUS) w/Device KIT Test BID   dapagliflozin propanediol (FARXIGA) 10 MG TABS tablet Take 1 tablet (10 mg total) by mouth daily before breakfast.   glucose blood (ACCU-CHEK AVIVA PLUS) test strip Test BID   Lancet Devices (ONE TOUCH DELICA LANCING DEV) MISC Use to check blood sugar daily   Lancets Misc. (ACCU-CHEK FASTCLIX LANCET) KIT Test BID   metFORMIN (GLUCOPHAGE) 1000 MG tablet Take  1 tablet (1,000 mg total) by mouth 2 (two) times daily with a meal.   rosuvastatin (CRESTOR) 20 MG tablet Take 1 tablet (20 mg total) by mouth daily.   No facility-administered encounter medications on file as of 10/08/2023.    Past Surgical History:  Procedure Laterality Date   NO PAST SURGERIES      Family History  Problem Relation Age of Onset   Diabetes Father 41       IDDM   Diabetes Sister 58       IDDM   Colon cancer Neg Hx    Esophageal cancer Neg Hx    Stomach cancer Neg Hx    Rectal cancer Neg Hx       Controlled substance contract: n/a     Review of Systems  Constitutional:  Negative for diaphoresis.  Eyes:  Negative for pain.  Respiratory:  Negative for shortness of breath.   Cardiovascular:  Negative for chest pain, palpitations and leg swelling.  Gastrointestinal:  Negative for abdominal pain.  Endocrine: Negative for polydipsia.  Skin:  Negative for rash.  Neurological:  Negative for dizziness, weakness and headaches.  Hematological:  Does not bruise/bleed easily.  All other systems reviewed and are negative.      Objective:   Physical Exam Vitals and nursing note reviewed.  Constitutional:      Appearance: Normal appearance. He is well-developed.  HENT:  Head: Normocephalic.     Nose: Nose normal.     Mouth/Throat:     Mouth: Mucous membranes are moist.     Pharynx: Oropharynx is clear.  Eyes:     Pupils: Pupils are equal, round, and reactive to light.  Neck:     Thyroid: No thyroid mass or thyromegaly.     Vascular: No carotid bruit or JVD.     Trachea: Phonation normal.  Cardiovascular:     Rate and Rhythm: Normal rate and regular rhythm.  Pulmonary:     Effort: Pulmonary effort is normal. No respiratory distress.     Breath sounds: Normal breath sounds.  Abdominal:     General: Bowel sounds are normal.     Palpations: Abdomen is soft.     Tenderness: There is no abdominal tenderness.  Musculoskeletal:        General: Normal  range of motion.     Cervical back: Normal range of motion and neck supple.  Lymphadenopathy:     Cervical: No cervical adenopathy.  Skin:    General: Skin is warm and dry.  Neurological:     Mental Status: He is alert and oriented to person, place, and time.  Psychiatric:        Behavior: Behavior normal.        Thought Content: Thought content normal.        Judgment: Judgment normal.      BP 124/81   Pulse 90   Temp 98.9 F (37.2 C) (Temporal)   Resp 20   Ht 5\' 9"  (1.753 m)   Wt 145 lb (65.8 kg)   SpO2 99%   BMI 21.41 kg/m   Hgba1c 8.6%    Assessment & Plan:  Patrick Valentine comes in today with chief complaint of Medical Management of Chronic Issues   Diagnosis and orders addressed:  1. Hyperlipidemia associated with type 2 diabetes mellitus (HCC) Low fat diet - CBC with Differential/Platelet - CMP14+EGFR - Lipid panel - Bayer DCA Hb A1c Waived  2. Diabetes mellitus treated with oral medication (HCC) Added lantus 10u nightly - CBC with Differential/Platelet - CMP14+EGFR - Lipid panel - Bayer DCA Hb A1c Waived - insulin glargine (LANTUS SOLOSTAR) 100 UNIT/ML Solostar Pen; Inject 10 Units into the skin daily.  Dispense: 15 mL; Refill: PRN  3. BMI 26.0-26.9,adult Discussed diet and exercise for person with BMI >25 Will recheck weight in 3-6 months    Labs pending Health Maintenance reviewed Diet and exercise encouraged  Follow up plan: 1 month   Mary-Margaret Daphine Deutscher, FNP

## 2023-10-09 LAB — CMP14+EGFR
ALT: 16 IU/L (ref 0–44)
AST: 15 IU/L (ref 0–40)
Albumin: 4.7 g/dL (ref 3.9–4.9)
Alkaline Phosphatase: 66 IU/L (ref 44–121)
BUN/Creatinine Ratio: 13 (ref 10–24)
BUN: 10 mg/dL (ref 8–27)
Bilirubin Total: 0.3 mg/dL (ref 0.0–1.2)
CO2: 24 mmol/L (ref 20–29)
Calcium: 9.9 mg/dL (ref 8.6–10.2)
Chloride: 98 mmol/L (ref 96–106)
Creatinine, Ser: 0.75 mg/dL — ABNORMAL LOW (ref 0.76–1.27)
Globulin, Total: 2.3 g/dL (ref 1.5–4.5)
Glucose: 177 mg/dL — ABNORMAL HIGH (ref 70–99)
Potassium: 4.7 mmol/L (ref 3.5–5.2)
Sodium: 137 mmol/L (ref 134–144)
Total Protein: 7 g/dL (ref 6.0–8.5)
eGFR: 101 mL/min/{1.73_m2} (ref >59)

## 2023-10-09 LAB — CBC WITH DIFFERENTIAL/PLATELET
Basophils Absolute: 0 10*3/uL (ref 0.0–0.2)
Basos: 1 %
EOS (ABSOLUTE): 0.1 10*3/uL (ref 0.0–0.4)
Eos: 2 %
Hematocrit: 48.2 % (ref 37.5–51.0)
Hemoglobin: 15.8 g/dL (ref 13.0–17.7)
Immature Grans (Abs): 0 10*3/uL (ref 0.0–0.1)
Immature Granulocytes: 0 %
Lymphocytes Absolute: 1.9 10*3/uL (ref 0.7–3.1)
Lymphs: 27 %
MCH: 30 pg (ref 26.6–33.0)
MCHC: 32.8 g/dL (ref 31.5–35.7)
MCV: 92 fL (ref 79–97)
Monocytes Absolute: 0.6 10*3/uL (ref 0.1–0.9)
Monocytes: 8 %
Neutrophils Absolute: 4.5 10*3/uL (ref 1.4–7.0)
Neutrophils: 62 %
Platelets: 341 10*3/uL (ref 150–450)
RBC: 5.27 x10E6/uL (ref 4.14–5.80)
RDW: 13.9 % (ref 11.6–15.4)
WBC: 7.2 10*3/uL (ref 3.4–10.8)

## 2023-10-09 LAB — LIPID PANEL
Chol/HDL Ratio: 3.8 ratio (ref 0.0–5.0)
Cholesterol, Total: 154 mg/dL (ref 100–199)
HDL: 41 mg/dL (ref >39)
LDL Chol Calc (NIH): 92 mg/dL (ref 0–99)
Triglycerides: 116 mg/dL (ref 0–149)
VLDL Cholesterol Cal: 21 mg/dL (ref 5–40)

## 2023-11-01 ENCOUNTER — Telehealth: Payer: Self-pay

## 2023-11-01 NOTE — Progress Notes (Signed)
Pharmacy Medication Assistance Program Note    11/01/2023  Patient ID: Patrick Valentine, male   DOB: 05/25/1961, 62 y.o.   MRN: [id number]     11/01/2023  [employer]reach [facility]  Manufacturer [employer] Nurse, adult Drugs Marcelline Deist  Dose of Farxiga 10MG   Type of Sport and exercise psychologist  Patient Assistance Determination Approved  Approval Start Date 12/15/2023  Approval End Date 12/13/2024

## 2023-11-05 ENCOUNTER — Ambulatory Visit (INDEPENDENT_AMBULATORY_CARE_PROVIDER_SITE_OTHER): Payer: Medicare HMO | Admitting: Nurse Practitioner

## 2023-11-05 ENCOUNTER — Encounter: Payer: Self-pay | Admitting: Nurse Practitioner

## 2023-11-05 VITALS — BP 134/84 | HR 90 | Temp 97.1°F | Resp 20 | Ht 69.0 in | Wt 148.0 lb

## 2023-11-05 DIAGNOSIS — E119 Type 2 diabetes mellitus without complications: Secondary | ICD-10-CM

## 2023-11-05 DIAGNOSIS — Z7984 Long term (current) use of oral hypoglycemic drugs: Secondary | ICD-10-CM | POA: Diagnosis not present

## 2023-11-05 LAB — BAYER DCA HB A1C WAIVED: HB A1C (BAYER DCA - WAIVED): 7.9 % — ABNORMAL HIGH (ref 4.8–5.6)

## 2023-11-05 NOTE — Progress Notes (Signed)
Subjective:    Patient ID: Patrick Valentine, male    DOB: [birth year], 62 y.o.   MRN: [id number]   Chief Complaint: diabetes only   HPI:  Patrick Valentine is a 62 y.o. who identifies as a male who was assigned male at birth.   Social history: Lives with: wife Work history: retired   Water engineer in today for follow up of the following chronic medical issues:  1. Diabetes mellitus treated with oral medication [facility]) Patient has been battling with his diabetes the last few months. He does really try to watch his diet and he exercises several times a week. His last hgba1c was 8.6. We had to start him on lantus 10u daily even though he really did not want to do that. Since starting lantus his blood sugars have been around 120-140. Have really improved. No low blood sugars.    New complaints: None today  No Known Allergies Outpatient Encounter Medications as of 11/05/2023  Medication Sig   Accu-Chek FastClix Lancets MISC Use to check blood sugar BID and prn   Acetylcysteine 600 MG CAPS Take by mouth.   Alcohol Swabs (B-D SINGLE USE SWABS REGULAR) PADS Test BID   Blood Glucose Calibration (ACCU-CHEK AVIVA) SOLN Use with glucose meter   Blood Glucose Monitoring Suppl (ACCU-CHEK AVIVA PLUS) w/Device KIT Test BID   dapagliflozin propanediol (FARXIGA) 10 MG TABS tablet Take 1 tablet (10 mg total) by mouth daily before breakfast.   glucose blood (ACCU-CHEK AVIVA PLUS) test strip Test BID   insulin glargine (LANTUS SOLOSTAR) 100 UNIT/ML Solostar Pen Inject 10 Units into the skin daily.   Insulin Pen Needle (PEN NEEDLES) 33G X 4 MM MISC 1 each by Does not apply route at bedtime.   Lancet Devices (ONE TOUCH DELICA LANCING DEV) MISC Use to check blood sugar daily   Lancets Misc. (ACCU-CHEK FASTCLIX LANCET) KIT Test BID   metFORMIN (GLUCOPHAGE) 1000 MG tablet Take 1 tablet (1,000 mg total) by mouth 2 (two) times daily with a meal.   rosuvastatin (CRESTOR) 20 MG tablet Take 1 tablet (20 mg total) by  mouth daily.   No facility-administered encounter medications on file as of 11/05/2023.    Past Surgical History:  Procedure Laterality Date   NO PAST SURGERIES      Family History  Problem Relation Age of Onset   Diabetes Father 55       IDDM   Diabetes Sister 64       IDDM   Colon cancer Neg Hx    Esophageal cancer Neg Hx    Stomach cancer Neg Hx    Rectal cancer Neg Hx       Controlled substance contract: n/a     Review of Systems  Constitutional:  Negative for diaphoresis.  Eyes:  Negative for pain.  Respiratory:  Negative for shortness of breath.   Cardiovascular:  Negative for chest pain, palpitations and leg swelling.  Gastrointestinal:  Negative for abdominal pain.  Endocrine: Negative for polydipsia.  Skin:  Negative for rash.  Neurological:  Negative for dizziness, weakness and headaches.  Hematological:  Does not bruise/bleed easily.  All other systems reviewed and are negative.      Objective:   Physical Exam Vitals and nursing note reviewed.  Constitutional:      Appearance: Normal appearance. He is well-developed.  Neck:     Thyroid: No thyroid mass or thyromegaly.     Vascular: No carotid bruit or JVD.     Trachea: Phonation normal.  Cardiovascular:     Rate and Rhythm: Normal rate and regular rhythm.  Pulmonary:     Effort: Pulmonary effort is normal. No respiratory distress.     Breath sounds: Normal breath sounds.  Abdominal:     General: Bowel sounds are normal.     Palpations: Abdomen is soft.     Tenderness: There is no abdominal tenderness.  Musculoskeletal:        General: Normal range of motion.     Cervical back: Normal range of motion and neck supple.  Lymphadenopathy:     Cervical: No cervical adenopathy.  Skin:    General: Skin is warm and dry.  Neurological:     Mental Status: He is alert and oriented to person, place, and time.  Psychiatric:        Behavior: Behavior normal.        Thought Content: Thought content  normal.        Judgment: Judgment normal.    BP 134/84   Pulse 90   Temp (!) 97.1 F (36.2 C) (Temporal)   Resp 20   Ht 5\' 9"  (1.753 m)   Wt 148 lb (67.1 kg)   BMI 21.86 kg/m    Hgba1c 7.9%     Assessment & Plan:   Vipin Lasota in today with chief complaint of No chief complaint on file.   1. Diabetes mellitus treated with oral medication (HCC) Continue to watch carbs in diet - Bayer DCA Hb A1c Waived    The above assessment and management plan was discussed with the patient. The patient verbalized understanding of and has agreed to the management plan. Patient is aware to call the clinic if symptoms persist or worsen. Patient is aware when to return to the clinic for a follow-up visit. Patient educated on when it is appropriate to go to the emergency department.   Mary-Margaret Daphine Deutscher, FNP

## 2024-01-04 ENCOUNTER — Encounter: Payer: Self-pay | Admitting: Pharmacist

## 2024-01-04 ENCOUNTER — Telehealth: Payer: Self-pay | Admitting: Pharmacist

## 2024-01-04 DIAGNOSIS — E119 Type 2 diabetes mellitus without complications: Secondary | ICD-10-CM

## 2024-01-04 MED ORDER — DAPAGLIFLOZIN PROPANEDIOL 10 MG PO TABS: 10.0000 mg | ORAL_TABLET | Freq: Every day | ORAL | 5 refills | Status: DC

## 2024-01-04 NOTE — Telephone Encounter (Signed)
 This encounter was created in error - please disregard.

## 2024-01-04 NOTE — Telephone Encounter (Signed)
   Patient [place] in the [place] patient [place] for [place].  Updated RX escribed to [facility] mail order (pharmacy for [place] patient [place]).  Patient is stable on current regimen.  He is enrolled until 12/13/2024.   Kieth Brightly, PharmD, BCACP, CPP Clinical Pharmacist, [facility]

## 2024-01-06 ENCOUNTER — Encounter: Payer: Self-pay | Admitting: Nurse Practitioner

## 2024-01-06 ENCOUNTER — Ambulatory Visit: Payer: Medicare Other | Admitting: Nurse Practitioner

## 2024-01-06 VITALS — BP 133/85 | HR 89 | Temp 97.3°F | Ht 69.0 in | Wt 155.0 lb

## 2024-01-06 DIAGNOSIS — E785 Hyperlipidemia, unspecified: Secondary | ICD-10-CM | POA: Diagnosis not present

## 2024-01-06 DIAGNOSIS — Z6826 Body mass index (BMI) 26.0-26.9, adult: Secondary | ICD-10-CM

## 2024-01-06 DIAGNOSIS — E119 Type 2 diabetes mellitus without complications: Secondary | ICD-10-CM

## 2024-01-06 DIAGNOSIS — R972 Elevated prostate specific antigen [PSA]: Secondary | ICD-10-CM

## 2024-01-06 DIAGNOSIS — Z7984 Long term (current) use of oral hypoglycemic drugs: Secondary | ICD-10-CM | POA: Diagnosis not present

## 2024-01-06 DIAGNOSIS — E1169 Type 2 diabetes mellitus with other specified complication: Secondary | ICD-10-CM

## 2024-01-06 LAB — BAYER DCA HB A1C WAIVED: HB A1C (BAYER DCA - WAIVED): 7.4 % — ABNORMAL HIGH (ref 4.8–5.6)

## 2024-01-06 MED ORDER — METFORMIN HCL 1000 MG PO TABS
1000.0000 mg | ORAL_TABLET | Freq: Two times a day (BID) | ORAL | 1 refills | Status: DC
Start: 2024-01-06 — End: 2024-03-16

## 2024-01-06 MED ORDER — ACCU-CHEK AVIVA PLUS VI STRP: ORAL_STRIP | 12 refills | Status: DC

## 2024-01-06 MED ORDER — LANTUS SOLOSTAR 100 UNIT/ML ~~LOC~~ SOPN: 10.0000 [IU] | PEN_INJECTOR | Freq: Every day | SUBCUTANEOUS | 99 refills | Status: DC

## 2024-01-06 MED ORDER — ONETOUCH DELICA LANCING DEV MISC: 1 refills | Status: DC

## 2024-01-06 MED ORDER — BLOOD GLUCOSE MONITORING SUPPL DEVI: 1.0000 | Freq: Three times a day (TID) | 0 refills | Status: DC

## 2024-01-06 MED ORDER — LANCETS MISC. MISC: 1.0000 | Freq: Three times a day (TID) | 0 refills | Status: DC

## 2024-01-06 MED ORDER — LANCET DEVICE MISC: 1.0000 | Freq: Three times a day (TID) | 0 refills | Status: DC

## 2024-01-06 MED ORDER — BLOOD GLUCOSE TEST VI STRP: 1.0000 | ORAL_STRIP | Freq: Three times a day (TID) | 0 refills | Status: DC

## 2024-01-06 MED ORDER — ROSUVASTATIN CALCIUM 20 MG PO TABS: 20.0000 mg | ORAL_TABLET | Freq: Every day | ORAL | 1 refills | Status: DC

## 2024-01-06 MED ORDER — DAPAGLIFLOZIN PROPANEDIOL 10 MG PO TABS: 10.0000 mg | ORAL_TABLET | Freq: Every day | ORAL | 1 refills | Status: DC

## 2024-01-06 NOTE — Patient Instructions (Signed)

## 2024-01-06 NOTE — Progress Notes (Signed)
Subjective:    Patient ID: Patrick Valentine, male    DOB: [birth year], 63 y.o.   MRN: [id number]   Chief Complaint: medical management of chronic issues     HPI:  Patrick Valentine is a 63 y.o. who identifies as a male who was assigned male at birth.   Social history: Lives with: wife Work history: retired   Water engineer in today for follow up of the following chronic medical issues:  1. Hyperlipidemia associated with type 2 diabetes mellitus (HCC) Does watch diet and exercises daily. Lab Results  Component Value Date   CHOL 154 10/08/2023   HDL 41 10/08/2023   LDLCALC 92 10/08/2023   TRIG 116 10/08/2023   CHOLHDL 3.8 10/08/2023   The 10-year ASCVD risk score (Arnett DK, et al., 2019) is: 19.8%   2. Diabetes mellitus treated with oral medication (HCC) Fasting blood sugars are running around 115-150. We added lantus 10u nightly to meds.  Lab Results  Component Value Date   HGBA1C 7.9 (H) 11/05/2023     3. BMI 26.0-26.9,adult Weight is up 7lbs  Wt Readings from Last 3 Encounters:  01/06/24 155 lb (70.3 kg)  11/05/23 148 lb (67.1 kg)  10/08/23 145 lb (65.8 kg)   BMI Readings from Last 3 Encounters:  01/06/24 22.89 kg/m  11/05/23 21.86 kg/m  10/08/23 21.41 kg/m       New complaints: Left foot pain- lateral side of foot. Started several weeks ago after starting lantus. Pain is intermittent. Rates 5-6/10. Repositioning makes better.   No Known Allergies Outpatient Encounter Medications as of 01/06/2024  Medication Sig   Accu-Chek FastClix Lancets MISC Use to check blood sugar BID and prn   Acetylcysteine 600 MG CAPS Take by mouth.   Alcohol Swabs (B-D SINGLE USE SWABS REGULAR) PADS Test BID   Blood Glucose Calibration (ACCU-CHEK AVIVA) SOLN Use with glucose meter   Blood Glucose Monitoring Suppl (ACCU-CHEK AVIVA PLUS) w/Device KIT Test BID   dapagliflozin propanediol (FARXIGA) 10 MG TABS tablet Take 1 tablet (10 mg total) by mouth daily before breakfast.    glucose blood (ACCU-CHEK AVIVA PLUS) test strip Test BID   insulin glargine (LANTUS SOLOSTAR) 100 UNIT/ML Solostar Pen Inject 10 Units into the skin daily.   Insulin Pen Needle (PEN NEEDLES) 33G X 4 MM MISC 1 each by Does not apply route at bedtime.   Lancet Devices (ONE TOUCH DELICA LANCING DEV) MISC Use to check blood sugar daily   Lancets Misc. (ACCU-CHEK FASTCLIX LANCET) KIT Test BID   metFORMIN (GLUCOPHAGE) 1000 MG tablet Take 1 tablet (1,000 mg total) by mouth 2 (two) times daily with a meal.   rosuvastatin (CRESTOR) 20 MG tablet Take 1 tablet (20 mg total) by mouth daily.   No facility-administered encounter medications on file as of 01/06/2024.    Past Surgical History:  Procedure Laterality Date   NO PAST SURGERIES      Family History  Problem Relation Age of Onset   Diabetes Father 67       IDDM   Diabetes Sister 53       IDDM   Colon cancer Neg Hx    Esophageal cancer Neg Hx    Stomach cancer Neg Hx    Rectal cancer Neg Hx       Controlled substance contract: n/a     Review of Systems  Constitutional:  Negative for diaphoresis.  Eyes:  Negative for pain.  Respiratory:  Negative for shortness of breath.   Cardiovascular:  Negative for chest pain, palpitations and leg swelling.  Gastrointestinal:  Negative for abdominal pain.  Endocrine: Negative for polydipsia.  Skin:  Negative for rash.  Neurological:  Negative for dizziness, weakness and headaches.  Hematological:  Does not bruise/bleed easily.  All other systems reviewed and are negative.      Objective:   Physical Exam Vitals and nursing note reviewed.  Constitutional:      Appearance: Normal appearance. He is well-developed.  HENT:     Head: Normocephalic.     Nose: Nose normal.     Mouth/Throat:     Mouth: Mucous membranes are moist.     Pharynx: Oropharynx is clear.  Eyes:     Pupils: Pupils are equal, round, and reactive to light.  Neck:     Thyroid: No thyroid mass or thyromegaly.      Vascular: No carotid bruit or JVD.     Trachea: Phonation normal.  Cardiovascular:     Rate and Rhythm: Normal rate and regular rhythm.  Pulmonary:     Effort: Pulmonary effort is normal. No respiratory distress.     Breath sounds: Normal breath sounds.  Abdominal:     General: Bowel sounds are normal.     Palpations: Abdomen is soft.     Tenderness: There is no abdominal tenderness.  Musculoskeletal:        General: Normal range of motion.     Cervical back: Normal range of motion and neck supple.  Lymphadenopathy:     Cervical: No cervical adenopathy.  Skin:    General: Skin is warm and dry.  Neurological:     Mental Status: He is alert and oriented to person, place, and time.  Psychiatric:        Behavior: Behavior normal.        Thought Content: Thought content normal.        Judgment: Judgment normal.    BP 133/85   Pulse 89   Temp (!) 97.3 F (36.3 C) (Temporal)   Ht 5\' 9"  (1.753 m)   Wt 155 lb (70.3 kg)   SpO2 100%   BMI 22.89 kg/m   Hgba1c 7.4%    Assessment & Plan:  [name] comes in today with chief complaint of No chief complaint on file.   Diagnosis and orders addressed:  1. Hyperlipidemia associated with type 2 diabetes mellitus (HCC) Low fat diet - CBC with Differential/Platelet - CMP14+EGFR - Lipid panel - Bayer DCA Hb A1c Waived  2. Diabetes mellitus treated with oral medication (HCC)  - CBC with Differential/Platelet - CMP14+EGFR - Lipid panel - Bayer DCA Hb A1c Waived - insulin glargine (LANTUS SOLOSTAR) 100 UNIT/ML Solostar Pen; Inject 10 Units into the skin daily.  Dispense: 15 mL; Refill: PRN  3. BMI 26.0-26.9,adult Discussed diet and exercise for person with BMI >25 Will recheck weight in 3-6 months  4. Left foot pain May be due to neuropathy and/or plantar fascitis Foot exercises Motrin as needed   Labs pending Health Maintenance reviewed Diet and exercise encouraged  Follow up plan: 3 months   Mary-Margaret  Daphine Deutscher, FNP

## 2024-01-06 NOTE — Addendum Note (Signed)
Addended by: Cleda Daub on: 01/06/2024 08:59 AM   Modules accepted: Orders

## 2024-01-06 NOTE — Addendum Note (Signed)
Addended by: Bennie Pierini on: 01/06/2024 08:40 AM   Modules accepted: Orders

## 2024-01-07 LAB — CBC WITH DIFFERENTIAL/PLATELET
Basophils Absolute: 0.1 10*3/uL (ref 0.0–0.2)
Basos: 1 %
EOS (ABSOLUTE): 0.1 10*3/uL (ref 0.0–0.4)
Eos: 2 %
Hematocrit: 47 % (ref 37.5–51.0)
Hemoglobin: 15.1 g/dL (ref 13.0–17.7)
Immature Grans (Abs): 0 10*3/uL (ref 0.0–0.1)
Immature Granulocytes: 0 %
Lymphocytes Absolute: 2.4 10*3/uL (ref 0.7–3.1)
Lymphs: 28 %
MCH: 29.4 pg (ref 26.6–33.0)
MCHC: 32.1 g/dL (ref 31.5–35.7)
MCV: 92 fL (ref 79–97)
Monocytes Absolute: 0.7 10*3/uL (ref 0.1–0.9)
Monocytes: 8 %
Neutrophils Absolute: 5.3 10*3/uL (ref 1.4–7.0)
Neutrophils: 61 %
Platelets: 320 10*3/uL (ref 150–450)
RBC: 5.13 x10E6/uL (ref 4.14–5.80)
RDW: 12.7 % (ref 11.6–15.4)
WBC: 8.6 10*3/uL (ref 3.4–10.8)

## 2024-01-07 LAB — CMP14+EGFR
ALT: 17 [IU]/L (ref 0–44)
AST: 18 [IU]/L (ref 0–40)
Albumin: 4.5 g/dL (ref 3.9–4.9)
Alkaline Phosphatase: 68 [IU]/L (ref 44–121)
BUN/Creatinine Ratio: 16 (ref 10–24)
BUN: 15 mg/dL (ref 8–27)
Bilirubin Total: 0.3 mg/dL (ref 0.0–1.2)
CO2: 21 mmol/L (ref 20–29)
Calcium: 9.6 mg/dL (ref 8.6–10.2)
Chloride: 97 mmol/L (ref 96–106)
Creatinine, Ser: 0.95 mg/dL (ref 0.76–1.27)
Globulin, Total: 2.3 g/dL (ref 1.5–4.5)
Glucose: 152 mg/dL — ABNORMAL HIGH (ref 70–99)
Potassium: 4.9 mmol/L (ref 3.5–5.2)
Sodium: 138 mmol/L (ref 134–144)
Total Protein: 6.8 g/dL (ref 6.0–8.5)
eGFR: 90 mL/min/{1.73_m2} (ref >59)

## 2024-01-07 LAB — LIPID PANEL
Chol/HDL Ratio: 4 {ratio} (ref 0.0–5.0)
Cholesterol, Total: 186 mg/dL (ref 100–199)
HDL: 46 mg/dL (ref >39)
LDL Chol Calc (NIH): 120 mg/dL — ABNORMAL HIGH (ref 0–99)
Triglycerides: 108 mg/dL (ref 0–149)
VLDL Cholesterol Cal: 20 mg/dL (ref 5–40)

## 2024-01-07 LAB — PSA, TOTAL AND FREE
PSA, Free Pct: 15.4 %
PSA, Free: 1.14 ng/mL
Prostate Specific Ag, Serum: 7.4 ng/mL — ABNORMAL HIGH (ref 0.0–4.0)

## 2024-01-10 ENCOUNTER — Encounter: Payer: Self-pay | Admitting: Nurse Practitioner

## 2024-01-10 ENCOUNTER — Ambulatory Visit: Payer: Medicare Other

## 2024-01-10 ENCOUNTER — Other Ambulatory Visit: Payer: Self-pay

## 2024-01-10 VITALS — Ht 69.0 in | Wt 155.0 lb

## 2024-01-10 DIAGNOSIS — Z0000 Encounter for general adult medical examination without abnormal findings: Secondary | ICD-10-CM | POA: Diagnosis not present

## 2024-01-10 DIAGNOSIS — E119 Type 2 diabetes mellitus without complications: Secondary | ICD-10-CM

## 2024-01-10 MED ORDER — BLOOD GLUCOSE MONITORING SUPPL DEVI: 1.0000 | Freq: Three times a day (TID) | 0 refills | Status: AC

## 2024-01-10 MED ORDER — PEN NEEDLES 33G X 4 MM MISC: 1.0000 | Freq: Every day | 3 refills | Status: DC

## 2024-01-10 MED ORDER — LANCETS MISC. MISC: 1.0000 | Freq: Three times a day (TID) | 0 refills | Status: DC

## 2024-01-10 MED ORDER — BLOOD GLUCOSE TEST VI STRP: 1.0000 | ORAL_STRIP | Freq: Three times a day (TID) | 0 refills | Status: DC

## 2024-01-10 MED ORDER — LANCET DEVICE MISC: 1.0000 | Freq: Three times a day (TID) | 0 refills | Status: DC

## 2024-01-10 NOTE — Patient Instructions (Signed)
Patrick Valentine , Thank you for taking time to come for your Medicare Wellness Visit. I appreciate your ongoing commitment to your health goals. Please review the following plan we discussed and let me know if I ca assist you in the future.   Referrals/Orders/Follow-Ups/Clinician Recommendations: Aim for 30 minutes of exercise or brisk walking, 6-8 glasses of water, and 5 servings of fruits and vegetables each day.  This is a list of the screening recommended for you and due dates:  Health Maintenance  Topic Date Due   HIV Screening  Never done   Zoster (Shingles) Vaccine (1 of 2) Never done   Eye exam for diabetics  12/23/2022   COVID-19 Vaccine (4 - 2024-25 season) 01/22/2024*   Yearly kidney health urinalysis for diabetes  03/21/2024   Complete foot exam   06/21/2024   Hemoglobin A1C  07/05/2024   Yearly kidney function blood test for diabetes  01/05/2025   Medicare Annual Wellness Visit  01/09/2025   Pneumococcal Vaccination (3 of 3 - PPSV23 or PCV20) 08/26/2025   DTaP/Tdap/Td vaccine (4 - Td or Tdap) 11/28/2025   Colon Cancer Screening  06/13/2027   Flu Shot  Completed   Hepatitis C Screening  Completed   HPV Vaccine  Aged Out  *Topic was postponed. The date shown is not the original due date.    Advanced directives: (ACP Link)Information on Advanced Care Planning can be found at [facility] [place] Advance Health Care Directives Advance Health Care Directives ([email or web address]   Next Medicare Annual Wellness Visit scheduled for next year: Yes

## 2024-01-10 NOTE — Progress Notes (Signed)
Subjective:   Patrick Valentine is a 63 y.o. male who presents for Medicare Annual/Subsequent preventive examination.  Visit Complete: Virtual I connected with  Patrick Valentine on 01/10/24 by a audio enabled telemedicine application and verified that I am speaking with the correct person using two identifiers.  Patient Location: Home  Provider Location: Home Office  This patient declined Interactive audio and video telecommunications. Therefore the visit was completed with audio only.  I discussed the limitations of evaluation and management by telemedicine. The patient expressed understanding and agreed to proceed.  Vital Signs: Because this visit was a virtual/telehealth visit, some criteria may be missing or patient reported. Any vitals not documented were not able to be obtained and vitals that have been documented are patient reported.  Cardiac Risk Factors include: advanced age (>11men, >47 women);diabetes mellitus;hypertension;male gender     Objective:    Today's Vitals   01/10/24 1636  Weight: 155 lb (70.3 kg)  Height: 5\' 9"  (1.753 m)   Body mass index is 22.89 kg/m.     01/10/2024    4:41 PM 01/07/2023    9:55 AM 01/06/2022    9:56 AM 10/11/2017    9:10 AM 09/24/2014    7:45 AM 09/10/2014    9:55 AM  Advanced Directives  Does Patient Have a Medical Advance Directive? No No No Yes No No  Type of Theme park manager;Living will    Does patient want to make changes to medical advance directive?    No - Patient declined    Copy of Healthcare Power of Attorney in Chart?    No - copy requested    Would patient like information on creating a medical advance directive? Yes (MAU/Ambulatory/Procedural Areas - Information given) No - Patient declined No - Patient declined       Current Medications (verified) Outpatient Encounter Medications as of 01/10/2024  Medication Sig   Accu-Chek FastClix Lancets MISC Use to check blood sugar BID and prn    Acetylcysteine 600 MG CAPS Take by mouth.   Alcohol Swabs (B-D SINGLE USE SWABS REGULAR) PADS Test BID   Blood Glucose Calibration (ACCU-CHEK AVIVA) SOLN Use with glucose meter   Blood Glucose Monitoring Suppl (ACCU-CHEK AVIVA PLUS) w/Device KIT Test BID   Blood Glucose Monitoring Suppl DEVI 1 each by Does not apply route in the morning, at noon, and at bedtime. May substitute to any manufacturer covered by patient's insurance.   Blood Glucose Monitoring Suppl DEVI 1 each by Does not apply route in the morning, at noon, and at bedtime. May substitute to any manufacturer covered by patient's insurance.   dapagliflozin propanediol (FARXIGA) 10 MG TABS tablet Take 1 tablet (10 mg total) by mouth daily before breakfast.   glucose blood (ACCU-CHEK AVIVA PLUS) test strip Test BID   Glucose Blood (BLOOD GLUCOSE TEST STRIPS) STRP 1 each by In Vitro route in the morning, at noon, and at bedtime. May substitute to any manufacturer covered by patient's insurance.   Glucose Blood (BLOOD GLUCOSE TEST STRIPS) STRP 1 each by In Vitro route in the morning, at noon, and at bedtime. May substitute to any manufacturer covered by patient's insurance.   insulin glargine (LANTUS SOLOSTAR) 100 UNIT/ML Solostar Pen Inject 10 Units into the skin daily.   Insulin Pen Needle (PEN NEEDLES) 33G X 4 MM MISC 1 each by Does not apply route at bedtime.   Lancet Device MISC 1 each by Does not apply route in the  morning, at noon, and at bedtime. May substitute to any manufacturer covered by patient's insurance.   Lancet Device MISC 1 each by Does not apply route in the morning, at noon, and at bedtime. May substitute to any manufacturer covered by patient's insurance.   Lancet Devices (ONE TOUCH DELICA LANCING DEV) MISC Use to check blood sugar daily   Lancets Misc. (ACCU-CHEK FASTCLIX LANCET) KIT Test BID   Lancets Misc. MISC 1 each by Does not apply route in the morning, at noon, and at bedtime. May substitute to any manufacturer  covered by patient's insurance.   Lancets Misc. MISC 1 each by Does not apply route in the morning, at noon, and at bedtime. May substitute to any manufacturer covered by patient's insurance.   metFORMIN (GLUCOPHAGE) 1000 MG tablet Take 1 tablet (1,000 mg total) by mouth 2 (two) times daily with a meal.   rosuvastatin (CRESTOR) 20 MG tablet Take 1 tablet (20 mg total) by mouth daily.   No facility-administered encounter medications on file as of 01/10/2024.    Allergies (verified) Patient has no known allergies.   History: Past Medical History:  Diagnosis Date   Diabetes mellitus without complication (HCC)    Hyperlipidemia    Past Surgical History:  Procedure Laterality Date   NO PAST SURGERIES     Family History  Problem Relation Age of Onset   Diabetes Father 12       IDDM   Diabetes Sister 36       IDDM   Colon cancer Neg Hx    Esophageal cancer Neg Hx    Stomach cancer Neg Hx    Rectal cancer Neg Hx    Social History   Socioeconomic History   Marital status: Married    Spouse name: Not on file   Number of children: 2   Years of education: Not on file   Highest education level: GED or equivalent  Occupational History   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  Tobacco Use   Smoking status: Never   Smokeless tobacco: Never  Vaping Use   Vaping status: Never Used  Substance and Sexual Activity   Alcohol use: No   Drug use: No   Sexual activity: Yes  Other Topics Concern   Not on file  Social History Narrative   2 sons - 6 grandchildren from them   One step - daughter who has 5 children - they all call him papa   Social Drivers of Corporate investment banker Strain: Low Risk  (01/10/2024)   Overall Financial Resource Strain (CARDIA)    Difficulty of Paying Living Expenses: Not hard at all  Food Insecurity: No Food Insecurity (01/10/2024)   Hunger Vital Sign    Worried About Running Out of Food in the Last Year: Never true    Ran Out of Food in the Last  Year: Never true  Transportation Needs: No Transportation Needs (01/10/2024)   PRAPARE - Administrator, Civil Service (Medical): No    Lack of Transportation (Non-Medical): No  Physical Activity: Sufficiently Active (01/10/2024)   Exercise Vital Sign    Days of Exercise per Week: 5 days    Minutes of Exercise per Session: 30 min  Stress: No Stress Concern Present (01/10/2024)   [employer] of Occupational Health - Occupational Stress Questionnaire    Feeling of Stress : Not at all  Social Connections: Socially Integrated (01/10/2024)   Social Connection and Isolation Panel [NHANES]    Frequency  of Communication with Friends and Family: More than three times a week    Frequency of Social Gatherings with Friends and Family: Once a week    Attends Religious Services: More than 4 times per year    Active Member of [employer] or Organizations: Yes    Attends Engineer, structural: More than 4 times per year    Marital Status: Married    Tobacco Counseling Counseling given: Not Answered   Clinical Intake:  Pre-visit preparation completed: Yes  Pain : No/denies pain     Diabetes: Yes CBG done?: No Did pt. bring in CBG monitor from home?: No  How often do you need to have someone help you when you read instructions, pamphlets, or other written materials from your doctor or pharmacy?: 1 - Never  Interpreter Needed?: No  Information entered by :: Kandis Fantasia LPN   Activities of Daily Living    01/10/2024    4:37 PM  In your present state of health, do you have any difficulty performing the following activities:  Hearing? 0  Vision? 0  Difficulty concentrating or making decisions? 0  Walking or climbing stairs? 0  Dressing or bathing? 0  Doing errands, shopping? 0  Preparing Food and eating ? N  Using the Toilet? N  In the past six months, have you accidently leaked urine? N  Do you have problems with loss of bowel control? N  Managing your  Medications? N  Managing your Finances? N  Housekeeping or managing your Housekeeping? N    Patient Care Team: Bennie Pierini, FNP as PCP - General (Nurse Practitioner) Rhea Belton, Carie Caddy, MD as Consulting Physician (Gastroenterology) Danella Maiers, [facility] (Pharmacist) Berlinda Last, MD as Referring Physician (Ophthalmology) Berneice Heinrich Delbert Phenix., MD as Consulting Physician (Urology) Pyrtle, Carie Caddy, MD as Consulting Physician (Gastroenterology)  Indicate any recent Medical Services you may have received from other than [facility] providers in the past year (date may be approximate).     Assessment:   This is a routine wellness examination for Wendal.  Hearing/Vision screen Hearing Screening - Comments:: Denies hearing difficulties   Vision Screening - Comments:: Wears rx glasses - up to date with routine eye exams with Dr. Helyn Numbers     Goals Addressed   None   Depression Screen    01/10/2024    4:39 PM 01/06/2024    8:03 AM 11/05/2023    8:02 AM 10/08/2023    8:06 AM 06/22/2023    8:24 AM 03/22/2023    9:45 AM 02/19/2023    9:26 AM  PHQ 2/9 Scores  PHQ - 2 Score 0 0 0 0 0 0 0  PHQ- 9 Score   0 0 0 0 0    Fall Risk    01/10/2024    4:41 PM 11/05/2023    8:02 AM 10/08/2023    8:06 AM 06/22/2023    8:24 AM 03/22/2023    9:45 AM  Fall Risk   Falls in the past year? 0 0 0 0 0  Number falls in past yr: 0      Injury with Fall? 0      Risk for fall due to : No Fall Risks      Follow up Falls prevention discussed;Education provided;Falls evaluation completed        MEDICARE RISK AT HOME: Medicare Risk at Home Any stairs in or around the home?: No If so, are there any without handrails?: No Home free of loose throw rugs  in walkways, pet beds, electrical cords, etc?: Yes Adequate lighting in your home to reduce risk of falls?: Yes Life alert?: No Use of a cane, walker or w/c?: No Grab bars in the bathroom?: Yes Shower chair or bench in shower?: No Elevated toilet  seat or a handicapped toilet?: Yes  TIMED UP AND GO:  Was the test performed?  No    Cognitive Function:    10/11/2017    9:15 AM 10/11/2017    9:11 AM  MMSE - Mini Mental State Exam  Orientation to time 5 5  Orientation to Place 5 5  Registration 3 3  Attention/ Calculation 5 5  Recall 3 3  Language- name 2 objects 2 2  Language- repeat 1 1  Language- follow 3 step command 3 3  Language- read & follow direction 1 1  Write a sentence 1 1  Copy design 1   Total score 30         01/10/2024    4:42 PM 01/07/2023    9:56 AM  6CIT Screen  What Year? 0 points 0 points  What month? 0 points 0 points  What time? 0 points 0 points  Count back from 20 0 points 0 points  Months in reverse 0 points 0 points  Repeat phrase 0 points 0 points  Total Score 0 points 0 points    Immunizations Immunization History  Administered Date(s) Administered   Influenza, Seasonal, Injecte, Preservative Fre 10/08/2023   Influenza,inj,Quad PF,6+ Mos 10/25/2014, 09/13/2015, 09/08/2016, 09/21/2017, 09/21/2018, 08/29/2019, 10/08/2020, 10/17/2021, 09/28/2022   Moderna SARS-COV2 Booster Vaccination 07/24/2021   Moderna Sars-Covid-2 Vaccination 02/22/2020, 03/21/2020, 11/14/2020   Pneumococcal Conjugate-13 03/18/2017   Pneumococcal Polysaccharide-23 10/25/2014   Td 12/11/2009   Tdap 12/11/2009, 11/29/2015    TDAP status: Up to date  Flu Vaccine status: Up to date  Pneumococcal vaccine status: Up to date  Covid-19 vaccine status: Information provided on how to obtain vaccines.   Qualifies for Shingles Vaccine? No ; never had chickenpox   Screening Tests Health Maintenance  Topic Date Due   HIV Screening  Never done   Zoster Vaccines- Shingrix (1 of 2) Never done   OPHTHALMOLOGY EXAM  12/23/2022   COVID-19 Vaccine (4 - 2024-25 season) 01/22/2024 (Originally 08/15/2023)   Diabetic kidney evaluation - Urine ACR  03/21/2024   FOOT EXAM  06/21/2024   HEMOGLOBIN A1C  07/05/2024   Diabetic  kidney evaluation - eGFR measurement  01/05/2025   Medicare Annual Wellness (AWV)  01/09/2025   Pneumococcal Vaccine 59-18 Years old (3 of 3 - PPSV23 or PCV20) 08/26/2025   DTaP/Tdap/Td (4 - Td or Tdap) 11/28/2025   Colonoscopy  06/13/2027   INFLUENZA VACCINE  Completed   Hepatitis C Screening  Completed   HPV VACCINES  Aged Out    Health Maintenance  Health Maintenance Due  Topic Date Due   HIV Screening  Never done   Zoster Vaccines- Shingrix (1 of 2) Never done   OPHTHALMOLOGY EXAM  12/23/2022    Colorectal cancer screening: Type of screening: Colonoscopy. Completed 06/12/20. Repeat every 7 years  Lung Cancer Screening: (Low Dose CT Chest recommended if Age 37-80 years, 20 pack-year currently smoking OR have quit w/in 15years.) does not qualify.   Lung Cancer Screening Referral: n/a  Additional Screening:  Hepatitis C Screening: does qualify; Completed 02/27/16  Vision Screening: Recommended annual ophthalmology exams for early detection of glaucoma and other disorders of the eye. Is the patient up to date with their annual  eye exam?  Yes  Who is the provider or what is the name of the office in which the patient attends annual eye exams? Dr. Helyn Numbers  If pt is not established with a provider, would they like to be referred to a provider to establish care? No .   Dental Screening: Recommended annual dental exams for proper oral hygiene  Diabetic Foot Exam: Diabetic Foot Exam: Completed 06/22/23  Community Resource Referral / Chronic Care Management: CRR required this visit?  No   CCM required this visit?  No     Plan:     I have personally reviewed and noted the following in the patient's chart:   Medical and social history Use of alcohol, tobacco or illicit drugs  Current medications and supplements including opioid prescriptions. Patient is not currently taking opioid prescriptions. Functional ability and status Nutritional status Physical activity Advanced  directives List of other physicians Hospitalizations, surgeries, and ER visits in previous 12 months Vitals Screenings to include cognitive, depression, and falls Referrals and appointments  In addition, I have reviewed and discussed with patient certain preventive protocols, quality metrics, and best practice recommendations. A written personalized care plan for preventive services as well as general preventive health recommendations were provided to patient.     Kandis Fantasia [place], [place]   1/61/0960   After Visit Summary: (MyChart) Due to this being a telephonic visit, the after visit summary with patients personalized plan was offered to patient via MyChart   Nurse Notes: No concerns at this time

## 2024-01-14 ENCOUNTER — Other Ambulatory Visit: Payer: Self-pay

## 2024-01-14 MED ORDER — DROPLET PEN NEEDLES 32G X 4 MM MISC: 3 refills | Status: DC

## 2024-01-14 MED ORDER — ACCU-CHEK FASTCLIX LANCETS MISC: 5 refills | Status: DC

## 2024-01-14 MED ORDER — ACCU-CHEK GUIDE TEST VI STRP: ORAL_STRIP | 12 refills | Status: DC

## 2024-01-19 ENCOUNTER — Other Ambulatory Visit: Payer: Self-pay | Admitting: *Deleted

## 2024-01-19 MED ORDER — ACCU-CHEK GUIDE TEST VI STRP: ORAL_STRIP | 3 refills | Status: DC

## 2024-01-19 MED ORDER — DROPLET PEN NEEDLES 32G X 4 MM MISC: 3 refills | Status: DC

## 2024-01-19 MED ORDER — ACCU-CHEK FASTCLIX LANCETS MISC: 3 refills | Status: DC

## 2024-03-16 ENCOUNTER — Other Ambulatory Visit: Payer: Self-pay

## 2024-03-16 DIAGNOSIS — E1169 Type 2 diabetes mellitus with other specified complication: Secondary | ICD-10-CM

## 2024-03-16 DIAGNOSIS — E119 Type 2 diabetes mellitus without complications: Secondary | ICD-10-CM

## 2024-03-16 MED ORDER — ROSUVASTATIN CALCIUM 20 MG PO TABS: 20.0000 mg | ORAL_TABLET | Freq: Every day | ORAL | 1 refills | Status: DC

## 2024-03-16 MED ORDER — LANTUS SOLOSTAR 100 UNIT/ML ~~LOC~~ SOPN: 10.0000 [IU] | PEN_INJECTOR | Freq: Every day | SUBCUTANEOUS | 99 refills | Status: DC

## 2024-03-16 MED ORDER — METFORMIN HCL 1000 MG PO TABS: 1000.0000 mg | ORAL_TABLET | Freq: Two times a day (BID) | ORAL | 1 refills | Status: DC

## 2024-03-21 ENCOUNTER — Other Ambulatory Visit: Payer: Self-pay

## 2024-03-21 DIAGNOSIS — E119 Type 2 diabetes mellitus without complications: Secondary | ICD-10-CM

## 2024-03-21 DIAGNOSIS — E1169 Type 2 diabetes mellitus with other specified complication: Secondary | ICD-10-CM

## 2024-03-21 MED ORDER — LANTUS SOLOSTAR 100 UNIT/ML ~~LOC~~ SOPN
10.0000 [IU] | PEN_INJECTOR | Freq: Every day | SUBCUTANEOUS | 99 refills | Status: DC
Start: 2024-03-21 — End: 2024-04-11

## 2024-03-21 MED ORDER — METFORMIN HCL 1000 MG PO TABS: 1000.0000 mg | ORAL_TABLET | Freq: Two times a day (BID) | ORAL | 1 refills | Status: DC

## 2024-03-21 MED ORDER — ROSUVASTATIN CALCIUM 20 MG PO TABS: 20.0000 mg | ORAL_TABLET | Freq: Every day | ORAL | 1 refills | Status: DC

## 2024-04-11 ENCOUNTER — Encounter: Payer: Self-pay | Admitting: Nurse Practitioner

## 2024-04-11 ENCOUNTER — Ambulatory Visit (INDEPENDENT_AMBULATORY_CARE_PROVIDER_SITE_OTHER): Payer: Medicare Other | Admitting: Nurse Practitioner

## 2024-04-11 ENCOUNTER — Ambulatory Visit (INDEPENDENT_AMBULATORY_CARE_PROVIDER_SITE_OTHER)

## 2024-04-11 VITALS — BP 131/84 | HR 83 | Temp 97.0°F | Ht 69.0 in | Wt 160.0 lb

## 2024-04-11 DIAGNOSIS — Z87891 Personal history of nicotine dependence: Secondary | ICD-10-CM | POA: Diagnosis not present

## 2024-04-11 DIAGNOSIS — Z0001 Encounter for general adult medical examination with abnormal findings: Secondary | ICD-10-CM | POA: Diagnosis not present

## 2024-04-11 DIAGNOSIS — R972 Elevated prostate specific antigen [PSA]: Secondary | ICD-10-CM

## 2024-04-11 DIAGNOSIS — E119 Type 2 diabetes mellitus without complications: Secondary | ICD-10-CM | POA: Diagnosis not present

## 2024-04-11 DIAGNOSIS — E785 Hyperlipidemia, unspecified: Secondary | ICD-10-CM

## 2024-04-11 DIAGNOSIS — Z7984 Long term (current) use of oral hypoglycemic drugs: Secondary | ICD-10-CM

## 2024-04-11 DIAGNOSIS — E1169 Type 2 diabetes mellitus with other specified complication: Secondary | ICD-10-CM

## 2024-04-11 LAB — BAYER DCA HB A1C WAIVED: HB A1C (BAYER DCA - WAIVED): 8 % — ABNORMAL HIGH (ref 4.8–5.6)

## 2024-04-11 LAB — LIPID PANEL

## 2024-04-11 MED ORDER — METFORMIN HCL 1000 MG PO TABS: 1000.0000 mg | ORAL_TABLET | Freq: Two times a day (BID) | ORAL | 1 refills | Status: DC

## 2024-04-11 MED ORDER — LANTUS SOLOSTAR 100 UNIT/ML ~~LOC~~ SOPN: 10.0000 [IU] | PEN_INJECTOR | Freq: Every day | SUBCUTANEOUS | 99 refills | Status: DC

## 2024-04-11 MED ORDER — ROSUVASTATIN CALCIUM 20 MG PO TABS: 20.0000 mg | ORAL_TABLET | Freq: Every day | ORAL | 1 refills | Status: DC

## 2024-04-11 NOTE — Progress Notes (Signed)
 Subjective:    Patient ID: Patrick Valentine, male    DOB: February 24, 1961, 63 y.o.   MRN: [id number]   Chief Complaint: annual physical    HPI:  Patrick Valentine is a 63 y.o. who identifies as a male who was assigned male at birth.   Social history: Lives with: wife Work history: retired   Water engineer in today for follow up of the following chronic medical issues:  1. Hyperlipidemia associated with type 2 diabetes mellitus (HCC) Does watch diet and exercises daily. Lab Results  Component Value Date   CHOL 186 01/06/2024   HDL 46 01/06/2024   LDLCALC 120 (H) 01/06/2024   TRIG 108 01/06/2024   CHOLHDL 4.0 01/06/2024   The 10-year ASCVD risk score (Arnett DK, et al., 2019) is: 23.7%   2. Diabetes mellitus treated with oral medication (HCC) Fasting blood sugars are running around 115-150. We added lantus  10u nightly to meds.  Lab Results  Component Value Date   HGBA1C 7.4 (H) 01/06/2024     3. BMI 26.0-26.9,adult Weight is up 7lbs  Wt Readings from Last 3 Encounters:  04/11/24 160 lb (72.6 kg)  01/10/24 155 lb (70.3 kg)  01/06/24 155 lb (70.3 kg)   BMI Readings from Last 3 Encounters:  04/11/24 23.63 kg/m  01/10/24 22.89 kg/m  01/06/24 22.89 kg/m         New complaints: None  today  No Known Allergies Outpatient Encounter Medications as of 04/11/2024  Medication Sig   Accu-Chek FastClix Lancets MISC check blood sugar BID Dx E11.9   Acetylcysteine 600 MG CAPS Take by mouth.   Alcohol Swabs (B-D SINGLE USE SWABS REGULAR) PADS Test BID   Blood Glucose Monitoring Suppl DEVI 1 each by Does not apply route in the morning, at noon, and at bedtime. May substitute to any manufacturer covered by patient's insurance.   dapagliflozin  propanediol (FARXIGA ) 10 MG TABS tablet Take 1 tablet (10 mg total) by mouth daily before breakfast.   glucose blood (ACCU-CHEK GUIDE TEST) test strip check blood sugar BID Dx E11.9   insulin glargine  (LANTUS  SOLOSTAR) 100 UNIT/ML Solostar  Pen Inject 10 Units into the skin daily.   Insulin Pen Needle (DROPLET PEN NEEDLES) 32G X 4 MM MISC check blood sugar BID Dx E11.9   metFORMIN  (GLUCOPHAGE ) 1000 MG tablet Take 1 tablet (1,000 mg total) by mouth 2 (two) times daily with a meal.   rosuvastatin  (CRESTOR ) 20 MG tablet Take 1 tablet (20 mg total) by mouth daily.   No facility-administered encounter medications on file as of 04/11/2024.    Past Surgical History:  Procedure Laterality Date   NO PAST SURGERIES      Family History  Problem Relation Age of Onset   Diabetes Father 22       IDDM   Diabetes Sister 51       IDDM   Colon cancer Neg Hx    Esophageal cancer Neg Hx    Stomach cancer Neg Hx    Rectal cancer Neg Hx       Controlled substance contract: n/a     Review of Systems  Constitutional:  Negative for diaphoresis.  Eyes:  Negative for pain.  Respiratory:  Negative for shortness of breath.   Cardiovascular:  Negative for chest pain, palpitations and leg swelling.  Gastrointestinal:  Negative for abdominal pain.  Endocrine: Negative for polydipsia.  Skin:  Negative for rash.  Neurological:  Negative for dizziness, weakness and headaches.  Hematological:  Does not bruise/bleed  easily.  All other systems reviewed and are negative.      Objective:   Physical Exam Vitals and nursing note reviewed.  Constitutional:      Appearance: Normal appearance. He is well-developed.  HENT:     Head: Normocephalic.     Nose: Nose normal.     Mouth/Throat:     Mouth: Mucous membranes are moist.     Pharynx: Oropharynx is clear.  Eyes:     Pupils: Pupils are equal, round, and reactive to light.  Neck:     Thyroid : No thyroid  mass or thyromegaly.     Vascular: No carotid bruit or JVD.     Trachea: Phonation normal.  Cardiovascular:     Rate and Rhythm: Normal rate and regular rhythm.  Pulmonary:     Effort: Pulmonary effort is normal. No respiratory distress.     Breath sounds: Normal breath sounds.   Abdominal:     General: Bowel sounds are normal.     Palpations: Abdomen is soft.     Tenderness: There is no abdominal tenderness.  Musculoskeletal:        General: Normal range of motion.     Cervical back: Normal range of motion and neck supple.  Lymphadenopathy:     Cervical: No cervical adenopathy.  Skin:    General: Skin is warm and dry.  Neurological:     Mental Status: He is alert and oriented to person, place, and time.  Psychiatric:        Behavior: Behavior normal.        Thought Content: Thought content normal.        Judgment: Judgment normal.    BP 131/84   Pulse 83   Temp (!) 97 F (36.1 C) (Temporal)   Ht 5\' 9"  (1.753 m)   Wt 160 lb (72.6 kg)   SpO2 99%   BMI 23.63 kg/m   EKG- NSR- Mary-Margaret Gaylyn Keas, FNP  Chest xray - clear- Preliminary reading by Irvine Mantis, FNP  [facility]   Hgba1c 8.0%    Assessment & Plan:  Patrick Valentine comes in today with chief complaint of annual physical  Diagnosis and orders addressed:  1. Hyperlipidemia associated with type 2 diabetes mellitus (HCC) Low fat diet - CBC with Differential/Platelet - CMP14+EGFR - Lipid panel - Bayer DCA Hb A1c Waived  2. Diabetes mellitus treated with oral medication (HCC) Stricter carb counting - CBC with Differential/Platelet - CMP14+EGFR - Lipid panel - Bayer DCA Hb A1c Waived - insulin glargine  (LANTUS  SOLOSTAR) 100 UNIT/ML Solostar Pen; Inject 10 Units into the skin daily.  Dispense: 15 mL; Refill: PRN  3. BMI 26.0-26.9,adult Discussed diet and exercise for person with BMI >25 Will recheck weight in 3-6 months  4. Left foot pain May be due to neuropathy and/or plantar fascitis Foot exercises Motrin as needed   Labs pending Health Maintenance reviewed Diet and exercise encouraged  Follow up plan: 3 months   Mary-Margaret Gaylyn Keas, FNP

## 2024-04-11 NOTE — Patient Instructions (Signed)
 Neuropathic Pain Neuropathic pain is pain caused by damage to the nerves that are responsible for certain sensations in your body (sensory nerves). Neuropathic pain can make you more sensitive to pain. Even a minor sensation can feel very painful. This is usually a long-term (chronic) condition that can be difficult to treat. The type of pain differs from person to person. It may: Start suddenly (acute), or it may develop slowly and become chronic. Come and go as damaged nerves heal, or it may stay at the same level for years. Cause emotional distress, loss of sleep, and a lower quality of life. What are the causes? The most common cause of this condition is diabetes. Many other diseases and conditions can also cause neuropathic pain. Causes of neuropathic pain can be classified as: Toxic. This is caused by medicines and chemicals. The most common causes of toxic neuropathic pain is damage from medicines that kill cancer cells (chemotherapy) or alcohol abuse. Metabolic. This can be caused by: Diabetes. Lack of vitamins like B12. Traumatic. Any injury that cuts, crushes, or stretches a nerve can cause damage and pain. Compression-related. If a sensory nerve gets trapped or compressed for a long period of time, the blood supply to the nerve can be cut off. Vascular. Many blood vessel diseases can cause neuropathic pain by decreasing blood supply and oxygen to nerves. Autoimmune. This type of pain results from diseases in which the body's defense system (immune system) mistakenly attacks sensory nerves. Examples of autoimmune diseases that can cause neuropathic pain include lupus and multiple sclerosis. Infectious. Many types of viral infections can damage sensory nerves and cause pain. Shingles infection is a common cause of this type of pain. Inherited. Neuropathic pain can be a symptom of many diseases that are passed down through families (genetic). What increases the risk? You are more likely to  develop this condition if: You have diabetes. You smoke. You drink too much alcohol. You are taking certain medicines, including chemotherapy or medicines that treat immune system disorders. What are the signs or symptoms? The main symptom is pain. Neuropathic pain is often described as: Burning. Shock-like. Stinging. Hot or cold. Itching. How is this diagnosed? No single test can diagnose neuropathic pain. It is diagnosed based on: A physical exam and your symptoms. Your health care provider will ask you about your pain. You may be asked to use a pain scale to describe how bad your pain is. Tests. These may be done to see if you have a cause and location of any nerve damage. They include: Nerve conduction studies and electromyography to test how well nerve signals travel through your nerves and muscles (electrodiagnostic testing). Skin biopsy to evaluate for small fiber neuropathy. Imaging studies, such as: X-rays. CT scan. MRI. How is this treated? Treatment for neuropathic pain may change over time. You may need to try different treatment options or a combination of treatments. Some options include: Treating the underlying cause of the neuropathy, such as diabetes, kidney disease, or vitamin deficiencies. Stopping medicines that can cause neuropathy, such as chemotherapy. Medicine to relieve pain. Medicines may include: Prescription or over-the-counter pain medicine. Anti-seizure medicine. Antidepressant medicines. Pain-relieving patches or creams that are applied to painful areas of skin. A medicine to numb the area (local anesthetic), which can be injected as a nerve block. Transcutaneous nerve stimulation. This uses electrical currents to block painful nerve signals. The treatment is painless. Alternative treatments, such as: Acupuncture. Meditation. Massage. Occupational or physical therapy. Pain management programs. Counseling. Follow  these instructions at  home: Medicines  Take over-the-counter and prescription medicines only as told by your health care provider. Ask your health care provider if the medicine prescribed to you: Requires you to avoid driving or using machinery. Can cause constipation. You may need to take these actions to prevent or treat constipation: Drink enough fluid to keep your urine pale yellow. Take over-the-counter or prescription medicines. Eat foods that are high in fiber, such as beans, whole grains, and fresh fruits and vegetables. Limit foods that are high in fat and processed sugars, such as fried or sweet foods. Lifestyle  Have a good support system at home. Consider joining a chronic pain support group. Do not use any products that contain nicotine or tobacco. These products include cigarettes, chewing tobacco, and vaping devices, such as e-cigarettes. If you need help quitting, ask your health care provider. Do not drink alcohol. General instructions Learn as much as you can about your condition. Work closely with all your health care providers to find the treatment plan that works best for you. Ask your health care provider what activities are safe for you. Keep all follow-up visits. This is important. Contact a health care provider if: Your pain treatments are not working. You are having side effects from your medicines. You are struggling with tiredness (fatigue), mood changes, depression, or anxiety. Get help right away if: You have thoughts of hurting yourself. Get help right away if you feel like you may hurt yourself or others, or have thoughts about taking your own life. Go to your nearest emergency room or: Call 911. Call the National Suicide Prevention Lifeline at 947-472-5826 or 988. This is open 24 hours a day. Text the Crisis Text Line at 657-070-3362. Summary Neuropathic pain is pain caused by damage to the nerves that are responsible for certain sensations in your body (sensory  nerves). Neuropathic pain may come and go as damaged nerves heal, or it may stay at the same level for years. Neuropathic pain is usually a long-term condition that can be difficult to treat. Consider joining a chronic pain support group. This information is not intended to replace advice given to you by your health care provider. Make sure you discuss any questions you have with your health care provider. Document Revised: 07/28/2021 Document Reviewed: 07/28/2021 Elsevier Patient Education  2024 [employer].

## 2024-04-12 LAB — LIPID PANEL
Cholesterol, Total: 165 mg/dL (ref 100–199)
HDL: 47 mg/dL (ref >39)
LDL CALC COMMENT:: 3.5 ratio (ref 0.0–5.0)
LDL Chol Calc (NIH): 103 mg/dL — ABNORMAL HIGH (ref 0–99)
Triglycerides: 81 mg/dL (ref 0–149)
VLDL Cholesterol Cal: 15 mg/dL (ref 5–40)

## 2024-04-12 LAB — CBC WITH DIFFERENTIAL/PLATELET
Basophils Absolute: 0 10*3/uL (ref 0.0–0.2)
Basos: 1 %
EOS (ABSOLUTE): 0.1 10*3/uL (ref 0.0–0.4)
Eos: 2 %
Hematocrit: 46.5 % (ref 37.5–51.0)
Hemoglobin: 15.2 g/dL (ref 13.0–17.7)
Immature Grans (Abs): 0 10*3/uL (ref 0.0–0.1)
Immature Granulocytes: 0 %
Lymphocytes Absolute: 2.4 10*3/uL (ref 0.7–3.1)
Lymphs: 32 %
MCH: 29.1 pg (ref 26.6–33.0)
MCHC: 32.7 g/dL (ref 31.5–35.7)
MCV: 89 fL (ref 79–97)
Monocytes Absolute: 0.7 10*3/uL (ref 0.1–0.9)
Monocytes: 9 %
Neutrophils Absolute: 4.1 10*3/uL (ref 1.4–7.0)
Neutrophils: 56 %
Platelets: 325 10*3/uL (ref 150–450)
RBC: 5.22 x10E6/uL (ref 4.14–5.80)
RDW: 13.9 % (ref 11.6–15.4)
WBC: 7.3 10*3/uL (ref 3.4–10.8)

## 2024-04-12 LAB — CMP14+EGFR
ALT: 16 IU/L (ref 0–44)
AST: 17 IU/L (ref 0–40)
Albumin: 4.7 g/dL (ref 3.9–4.9)
Alkaline Phosphatase: 70 IU/L (ref 44–121)
BUN/Creatinine Ratio: 15 (ref 10–24)
BUN: 13 mg/dL (ref 8–27)
Bilirubin Total: 0.4 mg/dL (ref 0.0–1.2)
CO2: 22 mmol/L (ref 20–29)
Calcium: 9.8 mg/dL (ref 8.6–10.2)
Chloride: 97 mmol/L (ref 96–106)
Creatinine, Ser: 0.85 mg/dL (ref 0.76–1.27)
Globulin, Total: 2.1 g/dL (ref 1.5–4.5)
Glucose: 133 mg/dL — ABNORMAL HIGH (ref 70–99)
Potassium: 5.1 mmol/L (ref 3.5–5.2)
Sodium: 135 mmol/L (ref 134–144)
Total Protein: 6.8 g/dL (ref 6.0–8.5)
eGFR: 98 mL/min/{1.73_m2} (ref >59)

## 2024-04-12 LAB — MICROALBUMIN / CREATININE URINE RATIO
Creatinine, Urine: 20.7 mg/dL
Microalb/Creat Ratio: <14 mg/g{creat} (ref 0–29)
Microalbumin, Urine: <3 ug/mL

## 2024-04-12 LAB — VITAMIN B12: Vitamin B-12: 336 pg/mL (ref 232–1245)

## 2024-04-12 LAB — PSA: Prostate Specific Ag, Serum: 7.2 ng/mL — ABNORMAL HIGH (ref 0.0–4.0)

## 2024-06-15 ENCOUNTER — Telehealth: Payer: Self-pay | Admitting: Pharmacy Technician

## 2024-06-15 NOTE — Progress Notes (Signed)
   06/15/2024 Name: Patrick Valentine MRN: [id number] DOB: Apr 23, 1961  Patient is appearing on a report for [facility] [place] [employer] Diabetes and last engaged with the clinical pharmacist to discuss diabetes on 01/04/2024. Contacted patient today to discuss diabetes management and completed medication review.   Diabetes Plan from last clinical pharmacist appointment: Patient enrolled in the [place] patient [place] program for Farxiga .  Updated RX escribed to [facility] (pharmacy for AZ&me patient assistance).  Patient is stable on current regimen.  He is enrolled until 12/13/2024.    Medication Adherence Barriers Identified:  Patient made recommended medication changes per plan: Yes Patient informs he uses Lantus  10 units daily at night, Metformin  1000mg  twice a day and Farxiga  10mg  once a day. Access issues with any new medication or testing device: No Patient informs he recies Farxiga  from patient assistance and it has been arriving automatically at his home address. Advised patient if he gets down to 1-2 week supply and next shipment has not come to reach out to PAP. Per Dr Annemarie, patient last received Lantus  on 5/1 and it was billed for 140 days supply and Metofromin last billed on 6/10 for 90 days supply. Patient portion of patient assistance completed: Yes Patient is approved in the AZ&ME program until 12/13/24. Patient is checking blood sugars as prescribed: Yes Patient informs he typically checks blood sugar every OTHER day in the morning before breakfast. He informs last blood sugar reading was 133 the other day and he informs it typically ranges between 130-140. Last A1C on 4/29 was 8.0  Medication Adherence Barriers Addressed/Actions Taken:  Reviewed medication changes per plan from last clinical pharmacist note Medication Access for Farixga  Educated patient to contact pharmacy regarding new prescriptions Patient Assistance Program approved  Reviewed instructions for monitoring  blood sugars at home and reminded patient to keep a written log to review with pharmacist Reminded patient of date/time of upcoming clinical pharmacist follow up and any upcoming PCP/specialists visits. Patient denies transportation barriers to the appointment. Yes  Next clinical pharmacist appointment is scheduled for: TBD, PCP appointment scheduled 06/29/24.  Jacquelyn Antony, CPhT [facility] Office: [phone] Email: [email or web address]

## 2024-06-28 ENCOUNTER — Telehealth: Payer: Self-pay

## 2024-06-28 DIAGNOSIS — E119 Type 2 diabetes mellitus without complications: Secondary | ICD-10-CM

## 2024-06-28 NOTE — Telephone Encounter (Signed)
 Received a refill request fax from AZ&ME patient assistance company for patients FARXIGA  medication.   Please send a 90 day supply (with refills) to [facility] if applicable. Thanks!

## 2024-06-29 ENCOUNTER — Other Ambulatory Visit: Payer: Self-pay

## 2024-06-29 ENCOUNTER — Ambulatory Visit: Admitting: Nurse Practitioner

## 2024-06-29 ENCOUNTER — Encounter: Payer: Self-pay | Admitting: Nurse Practitioner

## 2024-06-29 VITALS — BP 135/85 | HR 83 | Temp 97.7°F | Ht 69.0 in | Wt 162.0 lb

## 2024-06-29 DIAGNOSIS — H3552 Pigmentary retinal dystrophy: Secondary | ICD-10-CM | POA: Diagnosis not present

## 2024-06-29 DIAGNOSIS — Z7984 Long term (current) use of oral hypoglycemic drugs: Secondary | ICD-10-CM | POA: Diagnosis not present

## 2024-06-29 DIAGNOSIS — Z6826 Body mass index (BMI) 26.0-26.9, adult: Secondary | ICD-10-CM

## 2024-06-29 DIAGNOSIS — E1169 Type 2 diabetes mellitus with other specified complication: Secondary | ICD-10-CM | POA: Diagnosis not present

## 2024-06-29 DIAGNOSIS — E785 Hyperlipidemia, unspecified: Secondary | ICD-10-CM | POA: Diagnosis not present

## 2024-06-29 DIAGNOSIS — E119 Type 2 diabetes mellitus without complications: Secondary | ICD-10-CM | POA: Diagnosis not present

## 2024-06-29 LAB — BAYER DCA HB A1C WAIVED: HB A1C (BAYER DCA - WAIVED): 7.4 % — ABNORMAL HIGH (ref 4.8–5.6)

## 2024-06-29 LAB — LIPID PANEL

## 2024-06-29 MED ORDER — DAPAGLIFLOZIN PROPANEDIOL 10 MG PO TABS: 10.0000 mg | ORAL_TABLET | Freq: Every day | ORAL | 4 refills | Status: DC

## 2024-06-29 MED ORDER — BD SWAB SINGLE USE REGULAR PADS: MEDICATED_PAD | 3 refills | Status: AC

## 2024-06-29 MED ORDER — ROSUVASTATIN CALCIUM 20 MG PO TABS: 20.0000 mg | ORAL_TABLET | Freq: Every day | ORAL | 1 refills | Status: DC

## 2024-06-29 MED ORDER — DAPAGLIFLOZIN PROPANEDIOL 10 MG PO TABS: 10.0000 mg | ORAL_TABLET | Freq: Every day | ORAL | 1 refills | Status: DC

## 2024-06-29 MED ORDER — METFORMIN HCL 1000 MG PO TABS: 1000.0000 mg | ORAL_TABLET | Freq: Two times a day (BID) | ORAL | 1 refills | Status: DC

## 2024-06-29 MED ORDER — LANTUS SOLOSTAR 100 UNIT/ML ~~LOC~~ SOPN: 10.0000 [IU] | PEN_INJECTOR | Freq: Every day | SUBCUTANEOUS | 99 refills | Status: DC

## 2024-06-29 NOTE — Progress Notes (Signed)
 Subjective:    Patient ID: Patrick Valentine, male    DOB: Mar 19, 1961, 63 y.o.   MRN: [id number]   Chief Complaint: medical management of chronic issues     HPI:  Patrick Valentine is a 63 y.o. who identifies as a male who was assigned male at birth.   Social history: Lives with: wife Work history: retired   Water engineer in today for follow up of the following chronic medical issues:  1. Hyperlipidemia associated with type 2 diabetes mellitus (HCC) Does watch diet and exercises daily. Lab Results  Component Value Date   CHOL 165 04/11/2024   HDL 47 04/11/2024   LDLCALC 103 (H) 04/11/2024   TRIG 81 04/11/2024   CHOLHDL 3.5 04/11/2024   The 10-year ASCVD risk score (Arnett DK, et al., 2019) is: 18.7%   2. Diabetes mellitus treated with oral medication (HCC) Fasting blood sugars are running around 115-150. We added lantus  10u nightly to meds.  Lab Results  Component Value Date   HGBA1C 8.0 (H) 04/11/2024     3. BMI 26.0-26.9,adult Weight is up 5lbs  Wt Readings from Last 3 Encounters:  04/11/24 160 lb (72.6 kg)  01/10/24 155 lb (70.3 kg)  01/06/24 155 lb (70.3 kg)   BMI Readings from Last 3 Encounters:  04/11/24 23.63 kg/m  01/10/24 22.89 kg/m  01/06/24 22.89 kg/m       New complaints: None today  No Known Allergies Outpatient Encounter Medications as of 06/29/2024  Medication Sig   Accu-Chek FastClix Lancets MISC check blood sugar BID Dx E11.9   Acetylcysteine 600 MG CAPS Take by mouth.   Alcohol Swabs (B-D SINGLE USE SWABS REGULAR) PADS Test BID   Blood Glucose Monitoring Suppl DEVI 1 each by Does not apply route in the morning, at noon, and at bedtime. May substitute to any manufacturer covered by patient's insurance.   dapagliflozin  propanediol (FARXIGA ) 10 MG TABS tablet Take 1 tablet (10 mg total) by mouth daily before breakfast.   glucose blood (ACCU-CHEK GUIDE TEST) test strip check blood sugar BID Dx E11.9   insulin glargine  (LANTUS  SOLOSTAR) 100  UNIT/ML Solostar Pen Inject 10 Units into the skin daily.   Insulin Pen Needle (DROPLET PEN NEEDLES) 32G X 4 MM MISC check blood sugar BID Dx E11.9   metFORMIN  (GLUCOPHAGE ) 1000 MG tablet Take 1 tablet (1,000 mg total) by mouth 2 (two) times daily with a meal.   rosuvastatin  (CRESTOR ) 20 MG tablet Take 1 tablet (20 mg total) by mouth daily.   No facility-administered encounter medications on file as of 06/29/2024.    Past Surgical History:  Procedure Laterality Date   NO PAST SURGERIES      Family History  Problem Relation Age of Onset   Diabetes Father 24       IDDM   Diabetes Sister 49       IDDM   Colon cancer Neg Hx    Esophageal cancer Neg Hx    Stomach cancer Neg Hx    Rectal cancer Neg Hx       Controlled substance contract: n/a     Review of Systems  Constitutional:  Negative for diaphoresis.  Eyes:  Negative for pain.  Respiratory:  Negative for shortness of breath.   Cardiovascular:  Negative for chest pain, palpitations and leg swelling.  Gastrointestinal:  Negative for abdominal pain.  Endocrine: Negative for polydipsia.  Skin:  Negative for rash.  Neurological:  Negative for dizziness, weakness and headaches.  Hematological:  Does not  bruise/bleed easily.  All other systems reviewed and are negative.      Objective:   Physical Exam Vitals and nursing note reviewed.  Constitutional:      Appearance: Normal appearance. He is well-developed.  HENT:     Head: Normocephalic.     Nose: Nose normal.     Mouth/Throat:     Mouth: Mucous membranes are moist.     Pharynx: Oropharynx is clear.  Eyes:     Pupils: Pupils are equal, round, and reactive to light.  Neck:     Thyroid : No thyroid  mass or thyromegaly.     Vascular: No carotid bruit or JVD.     Trachea: Phonation normal.  Cardiovascular:     Rate and Rhythm: Normal rate and regular rhythm.  Pulmonary:     Effort: Pulmonary effort is normal. No respiratory distress.     Breath sounds: Normal  breath sounds.  Abdominal:     General: Bowel sounds are normal.     Palpations: Abdomen is soft.     Tenderness: There is no abdominal tenderness.  Musculoskeletal:        General: Normal range of motion.     Cervical back: Normal range of motion and neck supple.  Lymphadenopathy:     Cervical: No cervical adenopathy.  Skin:    General: Skin is warm and dry.  Neurological:     Mental Status: He is alert and oriented to person, place, and time.  Psychiatric:        Behavior: Behavior normal.        Thought Content: Thought content normal.        Judgment: Judgment normal.    BP 135/85   Pulse 83   Temp 97.7 F (36.5 C) (Temporal)   Ht 5' 9 (1.753 m)   Wt 162 lb (73.5 kg)   SpO2 97%   BMI 23.92 kg/m   Hgba1c 7.4%    Assessment & Plan:  Patrick Valentine comes in today with chief complaint of medical management of chronic issues    Diagnosis and orders addressed:  1. Hyperlipidemia associated with type 2 diabetes mellitus (HCC) Low fat diet - CBC with Differential/Platelet - CMP14+EGFR - Lipid panel - Bayer DCA Hb A1c Waived  2. Diabetes mellitus treated with oral medication (HCC)  - CBC with Differential/Platelet - CMP14+EGFR - Lipid panel - Bayer DCA Hb A1c Waived - insulin glargine  (LANTUS  SOLOSTAR) 100 UNIT/ML Solostar Pen; Inject 10 Units into the skin daily.  Dispense: 15 mL; Refill: PRN  3. BMI 26.0-26.9,adult Discussed diet and exercise for person with BMI >25 Will recheck weight in 3-6 months    Labs pending Health Maintenance reviewed Diet and exercise encouraged  Follow up plan: 3 months   Mary-Margaret Gladis, FNP

## 2024-06-30 ENCOUNTER — Ambulatory Visit: Payer: Self-pay | Admitting: Nurse Practitioner

## 2024-06-30 LAB — CMP14+EGFR
ALT: 19 IU/L (ref 0–44)
AST: 21 IU/L (ref 0–40)
Albumin: 4.6 g/dL (ref 3.9–4.9)
Alkaline Phosphatase: 61 IU/L (ref 44–121)
BUN/Creatinine Ratio: 10 (ref 10–24)
BUN: 8 mg/dL (ref 8–27)
Bilirubin Total: 0.4 mg/dL (ref 0.0–1.2)
CO2: 23 mmol/L (ref 20–29)
Calcium: 9.9 mg/dL (ref 8.6–10.2)
Chloride: 100 mmol/L (ref 96–106)
Creatinine, Ser: 0.83 mg/dL (ref 0.76–1.27)
Globulin, Total: 2.3 g/dL (ref 1.5–4.5)
Glucose: 154 mg/dL — ABNORMAL HIGH (ref 70–99)
Potassium: 5.3 mmol/L — ABNORMAL HIGH (ref 3.5–5.2)
Sodium: 137 mmol/L (ref 134–144)
Total Protein: 6.9 g/dL (ref 6.0–8.5)
eGFR: 98 mL/min/1.73 (ref >59)

## 2024-06-30 LAB — CBC WITH DIFFERENTIAL/PLATELET
Basophils Absolute: 0 x10E3/uL (ref 0.0–0.2)
Basos: 1 %
EOS (ABSOLUTE): 0.2 x10E3/uL (ref 0.0–0.4)
Eos: 2 %
Hematocrit: 45.9 % (ref 37.5–51.0)
Hemoglobin: 14.7 g/dL (ref 13.0–17.7)
Immature Grans (Abs): 0 x10E3/uL (ref 0.0–0.1)
Immature Granulocytes: 0 %
Lymphocytes Absolute: 2.2 x10E3/uL (ref 0.7–3.1)
Lymphs: 32 %
MCH: 29.2 pg (ref 26.6–33.0)
MCHC: 32 g/dL (ref 31.5–35.7)
MCV: 91 fL (ref 79–97)
Monocytes Absolute: 0.7 x10E3/uL (ref 0.1–0.9)
Monocytes: 10 %
Neutrophils Absolute: 3.7 x10E3/uL (ref 1.4–7.0)
Neutrophils: 55 %
Platelets: 310 x10E3/uL (ref 150–450)
RBC: 5.03 x10E6/uL (ref 4.14–5.80)
RDW: 14.4 % (ref 11.6–15.4)
WBC: 6.8 x10E3/uL (ref 3.4–10.8)

## 2024-06-30 LAB — VITAMIN B12: Vitamin B-12: 346 pg/mL (ref 232–1245)

## 2024-06-30 LAB — LIPID PANEL
Chol/HDL Ratio: 3.2 ratio (ref 0.0–5.0)
Cholesterol, Total: 152 mg/dL (ref 100–199)
HDL: 47 mg/dL (ref >39)
LDL Chol Calc (NIH): 86 mg/dL (ref 0–99)
Triglycerides: 101 mg/dL (ref 0–149)
VLDL Cholesterol Cal: 19 mg/dL (ref 5–40)

## 2024-07-07 DIAGNOSIS — H469 Unspecified optic neuritis: Secondary | ICD-10-CM | POA: Diagnosis not present

## 2024-07-07 DIAGNOSIS — H543 Unqualified visual loss, both eyes: Secondary | ICD-10-CM | POA: Diagnosis not present

## 2024-07-07 DIAGNOSIS — H5362 Acquired night blindness: Secondary | ICD-10-CM | POA: Diagnosis not present

## 2024-07-07 DIAGNOSIS — H903 Sensorineural hearing loss, bilateral: Secondary | ICD-10-CM | POA: Diagnosis not present

## 2024-07-07 DIAGNOSIS — H3552 Pigmentary retinal dystrophy: Secondary | ICD-10-CM | POA: Diagnosis not present

## 2024-07-07 DIAGNOSIS — H53453 Other localized visual field defect, bilateral: Secondary | ICD-10-CM | POA: Diagnosis not present

## 2024-08-02 ENCOUNTER — Other Ambulatory Visit

## 2024-08-07 ENCOUNTER — Telehealth: Payer: Self-pay

## 2024-08-07 NOTE — Progress Notes (Signed)
 Complex Care Management Care Guide Note  08/07/2024 Name: Patrick Valentine MRN: [id number] DOB: [birth year]  Patrick Valentine is a 63 y.o. year old male who is a primary care patient of Gladis Mustard, FNP and is actively engaged with the care management team. I reached out to Clem Rossetti by phone today to assist with re-scheduling  with the Pharmacist.  Follow up plan: Unsuccessful telephone outreach attempt made. A HIPAA compliant phone message was left for the patient providing contact information and requesting a return call.  Jeoffrey Buffalo , RMA     [facility]  [facility], [facility] Guide  Direct Dial: [phone]  Website: [email or web address]

## 2024-08-08 ENCOUNTER — Other Ambulatory Visit

## 2024-08-17 ENCOUNTER — Other Ambulatory Visit (INDEPENDENT_AMBULATORY_CARE_PROVIDER_SITE_OTHER)

## 2024-08-17 DIAGNOSIS — E119 Type 2 diabetes mellitus without complications: Secondary | ICD-10-CM

## 2024-08-17 DIAGNOSIS — Z794 Long term (current) use of insulin: Secondary | ICD-10-CM

## 2024-08-17 NOTE — Progress Notes (Signed)
 08/17/2024 Name: Patrick Valentine MRN: [id number] DOB: [birth year]  Chief Complaint  Patient presents with   Diabetes   Patrick Valentine is a 63 y.o. year old male who presented for a telephone visit.  I connected with  Patrick Valentine on 08/17/24 by telephone and verified that I am speaking with the correct person using two identifiers. I discussed the limitations of evaluation and management by telemedicine. The patient expressed understanding and agreed to proceed.  Patient was located in her home and PharmD in PCP office during this visit.   They were referred to the pharmacist by their PCP for assistance in managing diabetes and medication access.    Subjective:  Care Team: Primary Care Provider: Gladis Mustard, FNP ; Next Scheduled Visit: 10/03/24   Medication Access/Adherence  Current Pharmacy:  [facility] - [place], [place] - [address] [place] [address] [place] [address] Phone: [phone] Fax: [phone]  [facility] - [place], [place] - [address] [place] [address] Phone: [phone] Fax: [phone]   Patient reports affordability concerns with their medications: No  Patient reports access/transportation concerns to their pharmacy: No  Patient reports adherence concerns with their medications:  No     Diabetes:  Current medications: Lantus  10 units daily, farxiga  10mg , metformin  Medications tried in the past: sitagliptin , glipizide   Current glucose readings: FBG<130  Patient denies hypoglycemic s/sx including dizziness, shakiness, sweating. Patient denies hyperglycemic symptoms including polyuria, polydipsia, polyphagia, nocturia, neuropathy, blurred vision.  Current meal patterns:  Likes shrimp, hamburgers Discussed meal planning options and Plate method for healthy eating Avoid sugary drinks and desserts Incorporate balanced protein, non starchy veggies, 1 serving  of carbohydrate with each meal Increase water intake Increase physical activity as able  Current physical activity: encouraged as able, has treadmill, total gym  Current medication access support: AZ&me PAP for Farxiga   Macrovascular and Microvascular Risk Reduction:  Statin? yes (rosuvastatin  20mg ); ACEi/ARB? On SGLT2 Last urinary albumin/creatinine ratio:  Lab Results  Component Value Date   MICRALBCREAT <14 04/11/2024   MICRALBCREAT <22 03/22/2023   MICRALBCREAT <10 03/10/2022   MICRALBCREAT <6 02/05/2021   MICRALBCREAT 13 10/16/2019   MICRALBCREAT 6.0 06/18/2017   MICRALBCREAT 4.9 12/16/2016   MICRALBCREAT 9.2 11/29/2015   Last eye exam:  Lab Results  Component Value Date   HMDIABEYEEXA  06/11/2023     Comment:     UNABLE TO DETERMINE, ABSTRACTED BY HIM   Last foot exam: 06/29/2024 Tobacco Use:  Tobacco Use: Low Risk  (06/29/2024)   Patient History    Smoking Tobacco Use: Never    Smokeless Tobacco Use: Never    Passive Exposure: Not on file     Objective:  Lab Results  Component Value Date   HGBA1C 7.4 (H) 06/29/2024    Lab Results  Component Value Date   CREATININE 0.83 06/29/2024   BUN 8 06/29/2024   NA 137 06/29/2024   K 5.3 (H) 06/29/2024   CL 100 06/29/2024   CO2 23 06/29/2024    Lab Results  Component Value Date   CHOL 152 06/29/2024   HDL 47 06/29/2024   LDLCALC 86 06/29/2024   TRIG 101 06/29/2024   CHOLHDL 3.2 06/29/2024    Medications Reviewed Today     Reviewed by Billee Mliss BIRCH, RPH (Pharmacist) on 08/17/24 at [phone]  Med List Status: <None>   Medication Order Taking? Sig Documenting Provider Last Dose Status Informant  Accu-Chek FastClix Lancets MISC [id number]  check blood sugar BID Dx E11.9 Gladis Mustard, FNP  Active   Acetylcysteine 600 MG CAPS [id number]  Take by mouth. [provider]  Active   Alcohol Swabs (B-D SINGLE USE SWABS REGULAR) PADS [id number]  Test BID Gladis Mustard, FNP  Active   Blood  Glucose Monitoring Suppl DEVI [id number]  1 each by Does not apply route in the morning, at noon, and at bedtime. May substitute to any manufacturer covered by patient's insurance. Gladis, Mary-Margaret, FNP  Active   dapagliflozin  propanediol (FARXIGA ) 10 MG TABS tablet [id number]  Take 1 tablet (10 mg total) by mouth daily before breakfast. Gladis, Mary-Margaret, FNP  Active   glucose blood (ACCU-CHEK GUIDE TEST) test strip [id number]  check blood sugar BID Dx E11.9 Gladis Mustard, FNP  Active   insulin glargine  (LANTUS  SOLOSTAR) 100 UNIT/ML Solostar Pen [id number]  Inject 10 Units into the skin daily. Gladis, Mary-Margaret, FNP  Active   Insulin Pen Needle (DROPLET PEN NEEDLES) 32G X 4 MM MISC [id number]  check blood sugar BID Dx E11.9 Gladis Mustard, FNP  Active   metFORMIN  (GLUCOPHAGE ) 1000 MG tablet [id number]  Take 1 tablet (1,000 mg total) by mouth 2 (two) times daily with a meal. Gladis, Mary-Margaret, FNP  Active   rosuvastatin  (CRESTOR ) 20 MG tablet [id number]  Take 1 tablet (20 mg total) by mouth daily. Gladis, Mary-Margaret, FNP  Active               Assessment/Plan:   Diabetes: - Currently uncontrolled per goal A1c <7% but patient much improved. Cardiorenal risk reduction is optimized.. Blood pressure is at goal <130/80. LDL is not at goal, however patient improved and has increased physical activity/improved diet.  - Reviewed long term cardiovascular and renal outcomes of uncontrolled blood sugar. and Reviewed goal A1c, goal fasting, and goal 2 hour post prandial glucose. Recommended to check glucose fasting or if symptomatic - Recommend to continue current regimen. - For Farxiga --Discussed potential side effects of dehydration, genitourinary infections. - reports eye exam 06/2024 [facility] specialist  Follow Up Plan: November/December 2025, 09/2024 PCP follow up  Mliss Tarry Griffin, PharmD, BCACP, CPP Clinical Pharmacist, [facility]

## 2024-10-02 ENCOUNTER — Ambulatory Visit: Admitting: Nurse Practitioner

## 2024-10-02 ENCOUNTER — Encounter: Payer: Self-pay | Admitting: Nurse Practitioner

## 2024-10-02 ENCOUNTER — Telehealth: Payer: Self-pay

## 2024-10-02 VITALS — BP 131/85 | HR 83 | Temp 97.3°F | Ht 69.0 in | Wt 159.0 lb

## 2024-10-02 DIAGNOSIS — E1169 Type 2 diabetes mellitus with other specified complication: Secondary | ICD-10-CM | POA: Diagnosis not present

## 2024-10-02 DIAGNOSIS — Z7984 Long term (current) use of oral hypoglycemic drugs: Secondary | ICD-10-CM

## 2024-10-02 DIAGNOSIS — E785 Hyperlipidemia, unspecified: Secondary | ICD-10-CM

## 2024-10-02 DIAGNOSIS — Z23 Encounter for immunization: Secondary | ICD-10-CM

## 2024-10-02 DIAGNOSIS — Z794 Long term (current) use of insulin: Secondary | ICD-10-CM

## 2024-10-02 DIAGNOSIS — Z6826 Body mass index (BMI) 26.0-26.9, adult: Secondary | ICD-10-CM | POA: Diagnosis not present

## 2024-10-02 DIAGNOSIS — E119 Type 2 diabetes mellitus without complications: Secondary | ICD-10-CM

## 2024-10-02 DIAGNOSIS — R972 Elevated prostate specific antigen [PSA]: Secondary | ICD-10-CM

## 2024-10-02 LAB — BAYER DCA HB A1C WAIVED: HB A1C (BAYER DCA - WAIVED): 7.6 % — ABNORMAL HIGH (ref 4.8–5.6)

## 2024-10-02 LAB — LIPID PANEL

## 2024-10-02 MED ORDER — METFORMIN HCL 1000 MG PO TABS: 1000.0000 mg | ORAL_TABLET | Freq: Two times a day (BID) | ORAL | 1 refills | Status: DC

## 2024-10-02 MED ORDER — ROSUVASTATIN CALCIUM 20 MG PO TABS: 20.0000 mg | ORAL_TABLET | Freq: Every day | ORAL | 1 refills | Status: DC

## 2024-10-02 MED ORDER — LANTUS SOLOSTAR 100 UNIT/ML ~~LOC~~ SOPN: 10.0000 [IU] | PEN_INJECTOR | Freq: Every day | SUBCUTANEOUS | 99 refills | Status: DC

## 2024-10-02 MED ORDER — DAPAGLIFLOZIN PROPANEDIOL 10 MG PO TABS: 10.0000 mg | ORAL_TABLET | Freq: Every day | ORAL | 4 refills | Status: AC

## 2024-10-02 NOTE — Progress Notes (Signed)
 Subjective:    Patient ID: Patrick Valentine, male    DOB: 04-15-1961, 63 y.o.   MRN: [id number]   Chief Complaint: medical management of chronic issues     HPI:  Patrick Valentine is a 63 y.o. who identifies as a male who was assigned male at birth.   Social history: Lives with: wife Work history: retired   Water engineer in today for follow up of the following chronic medical issues:  1. Hyperlipidemia associated with type 2 diabetes mellitus (HCC) Does watch diet and exercises daily. Lab Results  Component Value Date   CHOL 152 06/29/2024   HDL 47 06/29/2024   LDLCALC 86 06/29/2024   TRIG 101 06/29/2024   CHOLHDL 3.2 06/29/2024   The 10-year ASCVD risk score (Arnett DK, et al., 2019) is: 20.1%   2. Diabetes mellitus treated with oral medication (HCC) Fasting blood sugars are running around 115-150. We added lantus  10u nightly to meds.  Lab Results  Component Value Date   HGBA1C 7.4 (H) 06/29/2024     3. BMI 26.0-26.9,adult Weight is up 5lbs  Wt Readings from Last 3 Encounters:  06/29/24 162 lb (73.5 kg)  04/11/24 160 lb (72.6 kg)  01/10/24 155 lb (70.3 kg)   BMI Readings from Last 3 Encounters:  06/29/24 23.92 kg/m  04/11/24 23.63 kg/m  01/10/24 22.89 kg/m       New complaints: None today  No Known Allergies Outpatient Encounter Medications as of 10/02/2024  Medication Sig   Accu-Chek FastClix Lancets MISC check blood sugar BID Dx E11.9   Acetylcysteine 600 MG CAPS Take by mouth.   Alcohol Swabs (B-D SINGLE USE SWABS REGULAR) PADS Test BID   Blood Glucose Monitoring Suppl DEVI 1 each by Does not apply route in the morning, at noon, and at bedtime. May substitute to any manufacturer covered by patient's insurance.   dapagliflozin  propanediol (FARXIGA ) 10 MG TABS tablet Take 1 tablet (10 mg total) by mouth daily before breakfast.   glucose blood (ACCU-CHEK GUIDE TEST) test strip check blood sugar BID Dx E11.9   insulin glargine  (LANTUS  SOLOSTAR) 100  UNIT/ML Solostar Pen Inject 10 Units into the skin daily.   Insulin Pen Needle (DROPLET PEN NEEDLES) 32G X 4 MM MISC check blood sugar BID Dx E11.9   metFORMIN  (GLUCOPHAGE ) 1000 MG tablet Take 1 tablet (1,000 mg total) by mouth 2 (two) times daily with a meal.   rosuvastatin  (CRESTOR ) 20 MG tablet Take 1 tablet (20 mg total) by mouth daily.   No facility-administered encounter medications on file as of 10/02/2024.    Past Surgical History:  Procedure Laterality Date   NO PAST SURGERIES      Family History  Problem Relation Age of Onset   Diabetes Father 56       IDDM   Diabetes Sister 53       IDDM   Colon cancer Neg Hx    Esophageal cancer Neg Hx    Stomach cancer Neg Hx    Rectal cancer Neg Hx       Controlled substance contract: n/a     Review of Systems  Constitutional:  Negative for diaphoresis.  Eyes:  Negative for pain.  Respiratory:  Negative for shortness of breath.   Cardiovascular:  Negative for chest pain, palpitations and leg swelling.  Gastrointestinal:  Negative for abdominal pain.  Endocrine: Negative for polydipsia.  Skin:  Negative for rash.  Neurological:  Negative for dizziness, weakness and headaches.  Hematological:  Does not bruise/bleed  easily.  All other systems reviewed and are negative.      Objective:   Physical Exam Vitals and nursing note reviewed.  Constitutional:      Appearance: Normal appearance. He is well-developed.  HENT:     Head: Normocephalic.     Nose: Nose normal.     Mouth/Throat:     Mouth: Mucous membranes are moist.     Pharynx: Oropharynx is clear.  Eyes:     Pupils: Pupils are equal, round, and reactive to light.  Neck:     Thyroid : No thyroid  mass or thyromegaly.     Vascular: No carotid bruit or JVD.     Trachea: Phonation normal.  Cardiovascular:     Rate and Rhythm: Normal rate and regular rhythm.  Pulmonary:     Effort: Pulmonary effort is normal. No respiratory distress.     Breath sounds: Normal  breath sounds.  Abdominal:     General: Bowel sounds are normal.     Palpations: Abdomen is soft.     Tenderness: There is no abdominal tenderness.  Musculoskeletal:        General: Normal range of motion.     Cervical back: Normal range of motion and neck supple.  Lymphadenopathy:     Cervical: No cervical adenopathy.  Skin:    General: Skin is warm and dry.  Neurological:     Mental Status: He is alert and oriented to person, place, and time.  Psychiatric:        Behavior: Behavior normal.        Thought Content: Thought content normal.        Judgment: Judgment normal.    BP 131/85   Pulse 83   Temp (!) 97.3 F (36.3 C) (Temporal)   Ht 5' 9 (1.753 m)   Wt 159 lb (72.1 kg)   SpO2 98%   BMI 23.48 kg/m    Hgba1c 7.6%    Assessment & Plan:  Patrick Valentine comes in today with chief complaint of Medical Management of Chronic Issues   Diagnosis and orders addressed:  1. Hyperlipidemia associated with type 2 diabetes mellitus (HCC) (Primary) Low fta diet - CBC with Differential/Platelet - CMP14+EGFR - Lipid panel - rosuvastatin  (CRESTOR ) 20 MG tablet; Take 1 tablet (20 mg total) by mouth daily.  Dispense: 90 tablet; Refill: 1  2. Diabetes mellitus treated with oral medication (HCC) Continue to watch carbs in diet - Bayer DCA Hb A1c Waived - metFORMIN  (GLUCOPHAGE ) 1000 MG tablet; Take 1 tablet (1,000 mg total) by mouth 2 (two) times daily with a meal.  Dispense: 180 tablet; Refill: 1 - dapagliflozin  propanediol (FARXIGA ) 10 MG TABS tablet; Take 1 tablet (10 mg total) by mouth daily before breakfast.  Dispense: 90 tablet; Refill: 4 - insulin glargine  (LANTUS  SOLOSTAR) 100 UNIT/ML Solostar Pen; Inject 10 Units into the skin daily.  Dispense: 15 mL; Refill: PRN  3. BMI 26.0-26.9,adult Discussed diet and exercise for person with BMI >25 Will recheck weight in 3-6 months   4. Elevated PSA Labs pending - PSA, total and free    Labs pending Health Maintenance  reviewed Diet and exercise encouraged  Follow up plan: 3 month   Mary-Margaret Gladis, FNP

## 2024-10-03 ENCOUNTER — Other Ambulatory Visit: Payer: Self-pay

## 2024-10-03 ENCOUNTER — Ambulatory Visit: Payer: Self-pay | Admitting: Nurse Practitioner

## 2024-10-03 DIAGNOSIS — R972 Elevated prostate specific antigen [PSA]: Secondary | ICD-10-CM

## 2024-10-03 LAB — CBC WITH DIFFERENTIAL/PLATELET
Basophils Absolute: 0 x10E3/uL (ref 0.0–0.2)
Basos: 1 %
EOS (ABSOLUTE): 0.1 x10E3/uL (ref 0.0–0.4)
Eos: 2 %
Hematocrit: 48.1 % (ref 37.5–51.0)
Hemoglobin: 15 g/dL (ref 13.0–17.7)
Immature Grans (Abs): 0 x10E3/uL (ref 0.0–0.1)
Immature Granulocytes: 0 %
Lymphocytes Absolute: 1.9 x10E3/uL (ref 0.7–3.1)
Lymphs: 27 %
MCH: 27.8 pg (ref 26.6–33.0)
MCHC: 31.2 g/dL — ABNORMAL LOW (ref 31.5–35.7)
MCV: 89 fL (ref 79–97)
Monocytes Absolute: 0.6 x10E3/uL (ref 0.1–0.9)
Monocytes: 8 %
Neutrophils Absolute: 4.3 x10E3/uL (ref 1.4–7.0)
Neutrophils: 62 %
Platelets: 336 x10E3/uL (ref 150–450)
RBC: 5.39 x10E6/uL (ref 4.14–5.80)
RDW: 13.8 % (ref 11.6–15.4)
WBC: 6.9 x10E3/uL (ref 3.4–10.8)

## 2024-10-03 LAB — LIPID PANEL
Cholesterol, Total: 166 mg/dL (ref 100–199)
HDL: 46 mg/dL (ref >39)
LDL CALC COMMENT:: 3.6 ratio (ref 0.0–5.0)
LDL Chol Calc (NIH): 99 mg/dL (ref 0–99)
Triglycerides: 119 mg/dL (ref 0–149)
VLDL Cholesterol Cal: 21 mg/dL (ref 5–40)

## 2024-10-03 LAB — CMP14+EGFR
ALT: 15 IU/L (ref 0–44)
AST: 16 IU/L (ref 0–40)
Albumin: 4.6 g/dL (ref 3.9–4.9)
Alkaline Phosphatase: 65 IU/L (ref 47–123)
BUN/Creatinine Ratio: 9 — AB (ref 10–24)
BUN: 8 mg/dL (ref 8–27)
Bilirubin Total: 0.4 mg/dL (ref 0.0–1.2)
CO2: 22 mmol/L (ref 20–29)
Calcium: 9.8 mg/dL (ref 8.6–10.2)
Chloride: 102 mmol/L (ref 96–106)
Creatinine, Ser: 0.88 mg/dL (ref 0.76–1.27)
Globulin, Total: 2.6 g/dL (ref 1.5–4.5)
Glucose: 142 mg/dL — AB (ref 70–99)
Potassium: 5.3 mmol/L — AB (ref 3.5–5.2)
Sodium: 137 mmol/L (ref 134–144)
Total Protein: 7.2 g/dL (ref 6.0–8.5)
eGFR: 96 mL/min/1.73 (ref >59)

## 2024-10-03 LAB — PSA, TOTAL AND FREE
PSA, Free Pct: 12.8 %
PSA, Free: 1.14 ng/mL
Prostate Specific Ag, Serum: 8.9 ng/mL — AB (ref 0.0–4.0)

## 2024-10-31 NOTE — Telephone Encounter (Signed)
 Patient did receive letter for approval but they have not done anything further and are not sure how to proceed. Can they be contacted and given instructions on what to do?

## 2024-12-03 ENCOUNTER — Other Ambulatory Visit: Payer: Self-pay | Admitting: Nurse Practitioner

## 2024-12-15 ENCOUNTER — Other Ambulatory Visit: Payer: Self-pay

## 2024-12-15 DIAGNOSIS — R972 Elevated prostate specific antigen [PSA]: Secondary | ICD-10-CM

## 2024-12-25 ENCOUNTER — Ambulatory Visit: Payer: Self-pay | Admitting: Nurse Practitioner

## 2024-12-25 ENCOUNTER — Encounter: Payer: Self-pay | Admitting: Nurse Practitioner

## 2024-12-25 VITALS — BP 132/82 | HR 93 | Temp 97.5°F | Ht 69.0 in | Wt 164.0 lb

## 2024-12-25 DIAGNOSIS — E1169 Type 2 diabetes mellitus with other specified complication: Secondary | ICD-10-CM

## 2024-12-25 DIAGNOSIS — R972 Elevated prostate specific antigen [PSA]: Secondary | ICD-10-CM

## 2024-12-25 DIAGNOSIS — Z7984 Long term (current) use of oral hypoglycemic drugs: Secondary | ICD-10-CM | POA: Diagnosis not present

## 2024-12-25 DIAGNOSIS — Z794 Long term (current) use of insulin: Secondary | ICD-10-CM | POA: Diagnosis not present

## 2024-12-25 DIAGNOSIS — Z6826 Body mass index (BMI) 26.0-26.9, adult: Secondary | ICD-10-CM | POA: Diagnosis not present

## 2024-12-25 DIAGNOSIS — E119 Type 2 diabetes mellitus without complications: Secondary | ICD-10-CM | POA: Diagnosis not present

## 2024-12-25 DIAGNOSIS — E785 Hyperlipidemia, unspecified: Secondary | ICD-10-CM | POA: Diagnosis not present

## 2024-12-25 LAB — LIPID PANEL

## 2024-12-25 LAB — BAYER DCA HB A1C WAIVED: HB A1C (BAYER DCA - WAIVED): 8.1 % — ABNORMAL HIGH (ref 4.8–5.6)

## 2024-12-25 MED ORDER — METFORMIN HCL 1000 MG PO TABS: 1000.0000 mg | ORAL_TABLET | Freq: Two times a day (BID) | ORAL | 1 refills | Status: AC

## 2024-12-25 MED ORDER — LANTUS SOLOSTAR 100 UNIT/ML ~~LOC~~ SOPN: 10.0000 [IU] | PEN_INJECTOR | Freq: Every day | SUBCUTANEOUS | 99 refills | Status: DC

## 2024-12-25 MED ORDER — LANTUS SOLOSTAR 100 UNIT/ML ~~LOC~~ SOPN: 15.0000 [IU] | PEN_INJECTOR | Freq: Every day | SUBCUTANEOUS | 99 refills | Status: AC

## 2024-12-25 MED ORDER — ROSUVASTATIN CALCIUM 20 MG PO TABS: 20.0000 mg | ORAL_TABLET | Freq: Every day | ORAL | 1 refills | Status: AC

## 2024-12-25 NOTE — Patient Instructions (Signed)

## 2024-12-25 NOTE — Progress Notes (Signed)
 "  Subjective:    Patient ID: Patrick Valentine, male    DOB: 05-04-1961, 64 y.o.   MRN: [id number]   Chief Complaint: medical management of chronic issues     HPI:  Patrick Valentine is a 64 y.o. who identifies as a male who was assigned male at birth.   Social history: Lives with: wife Work history: retired   Water Engineer in today for follow up of the following chronic medical issues:  1. Hyperlipidemia associated with type 2 diabetes mellitus (HCC) Does watch diet and exercises daily. Lab Results  Component Value Date   CHOL 166 10/02/2024   HDL 46 10/02/2024   LDLCALC 99 10/02/2024   TRIG 119 10/02/2024   CHOLHDL 3.6 10/02/2024   The 10-year ASCVD risk score (Arnett DK, et al., 2019) is: 20.6%   2. Diabetes mellitus type 2 with other specified complications Fasting blood sugars are running around 115-150. We added lantus  10u nightly to meds.  Lab Results  Component Value Date   HGBA1C 7.6 (H) 10/02/2024     3. BMI 26.0-26.9,adult Weight is up 5lbs   Wt Readings from Last 3 Encounters:  12/25/24 164 lb (74.4 kg)  10/02/24 159 lb (72.1 kg)  06/29/24 162 lb (73.5 kg)   BMI Readings from Last 3 Encounters:  12/25/24 24.22 kg/m  10/02/24 23.48 kg/m  06/29/24 23.92 kg/m        New complaints: None today  No Known Allergies Outpatient Encounter Medications as of 12/25/2024  Medication Sig   Accu-Chek FastClix Lancets MISC check blood sugar BID Dx E11.9   Acetylcysteine 600 MG CAPS Take by mouth.   Alcohol Swabs (B-D SINGLE USE SWABS REGULAR) PADS Test BID   Blood Glucose Monitoring Suppl DEVI 1 each by Does not apply route in the morning, at noon, and at bedtime. May substitute to any manufacturer covered by patient's insurance.   dapagliflozin  propanediol (FARXIGA ) 10 MG TABS tablet Take 1 tablet (10 mg total) by mouth daily before breakfast.   glucose blood (ACCU-CHEK GUIDE TEST) test strip check blood sugar BID Dx E11.9   insulin glargine  (LANTUS  SOLOSTAR)  100 UNIT/ML Solostar Pen Inject 10 Units into the skin daily.   Insulin Pen Needle (DROPLET PEN NEEDLES) 32G X 4 MM MISC check blood sugar BID Dx E11.9   metFORMIN  (GLUCOPHAGE ) 1000 MG tablet Take 1 tablet (1,000 mg total) by mouth 2 (two) times daily with a meal.   rosuvastatin  (CRESTOR ) 20 MG tablet Take 1 tablet (20 mg total) by mouth daily.   No facility-administered encounter medications on file as of 12/25/2024.    Past Surgical History:  Procedure Laterality Date   NO PAST SURGERIES      Family History  Problem Relation Age of Onset   Diabetes Father 63       IDDM   Diabetes Sister 82       IDDM   Colon cancer Neg Hx    Esophageal cancer Neg Hx    Stomach cancer Neg Hx    Rectal cancer Neg Hx       Controlled substance contract: n/a     Review of Systems  Constitutional:  Negative for diaphoresis.  Eyes:  Negative for pain.  Respiratory:  Negative for shortness of breath.   Cardiovascular:  Negative for chest pain, palpitations and leg swelling.  Gastrointestinal:  Negative for abdominal pain.  Endocrine: Negative for polydipsia.  Skin:  Negative for rash.  Neurological:  Negative for dizziness, weakness and headaches.  Hematological:  Does not bruise/bleed easily.  All other systems reviewed and are negative.      Objective:   Physical Exam Vitals and nursing note reviewed.  Constitutional:      Appearance: Normal appearance. He is well-developed.  HENT:     Head: Normocephalic.     Nose: Nose normal.     Mouth/Throat:     Mouth: Mucous membranes are moist.     Pharynx: Oropharynx is clear.  Eyes:     Pupils: Pupils are equal, round, and reactive to light.  Neck:     Thyroid : No thyroid  mass or thyromegaly.     Vascular: No carotid bruit or JVD.     Trachea: Phonation normal.  Cardiovascular:     Rate and Rhythm: Normal rate and regular rhythm.  Pulmonary:     Effort: Pulmonary effort is normal. No respiratory distress.     Breath sounds:  Normal breath sounds.  Abdominal:     General: Bowel sounds are normal.     Palpations: Abdomen is soft.     Tenderness: There is no abdominal tenderness.  Musculoskeletal:        General: Normal range of motion.     Cervical back: Normal range of motion and neck supple.  Lymphadenopathy:     Cervical: No cervical adenopathy.  Skin:    General: Skin is warm and dry.  Neurological:     Mental Status: He is alert and oriented to person, place, and time.  Psychiatric:        Behavior: Behavior normal.        Thought Content: Thought content normal.        Judgment: Judgment normal.    BP (!) 142/89   Pulse 93   Temp (!) 97.5 F (36.4 C) (Temporal)   Ht 5' 9 (1.753 m)   Wt 164 lb (74.4 kg)   SpO2 99%   BMI 24.22 kg/m     Hgba1c 8.1%    Assessment & Plan:  Patrick Valentine comes in today with chief complaint of medical management of chronic issues    Diagnosis and orders addressed:  1. Hyperlipidemia associated with type 2 diabetes mellitus (HCC) (Primary) Low fta diet - CBC with Differential/Platelet - CMP14+EGFR - Lipid panel - rosuvastatin  (CRESTOR ) 20 MG tablet; Take 1 tablet (20 mg total) by mouth daily.  Dispense: 90 tablet; Refill: 1  2. Diabetes mellitus treated with oral medication (HCC) Continue to watch carbs in diet - Bayer DCA Hb A1c Waived - metFORMIN  (GLUCOPHAGE ) 1000 MG tablet; Take 1 tablet (1,000 mg total) by mouth 2 (two) times daily with a meal.  Dispense: 180 tablet; Refill: 1 - dapagliflozin  propanediol (FARXIGA ) 10 MG TABS tablet; Take 1 tablet (10 mg total) by mouth daily before breakfast.  Dispense: 90 tablet; Refill: 4 - insulin glargine  (LANTUS  SOLOSTAR) 100 UNIT/ML Solostar Pen; Inject 15 Units into the skin daily.  Dispense: 15 mL; Refill: PRN  3. BMI 26.0-26.9,adult Discussed diet and exercise for person with BMI >25 Will recheck weight in 3-6 months   4. Elevated PSA Labs pending - PSA, total and free    Labs pending Health  Maintenance reviewed Diet and exercise encouraged  Follow up plan: 3 month   Mary-Margaret Gladis, FNP  "

## 2024-12-25 NOTE — Addendum Note (Signed)
 Addended by: Aryam Zhan, MARY-MARGARET on: 12/25/2024 08:57 AM   Modules accepted: Orders

## 2024-12-26 ENCOUNTER — Ambulatory Visit: Payer: Self-pay | Admitting: Nurse Practitioner

## 2024-12-26 ENCOUNTER — Other Ambulatory Visit: Payer: Self-pay

## 2024-12-26 DIAGNOSIS — E1169 Type 2 diabetes mellitus with other specified complication: Secondary | ICD-10-CM

## 2024-12-26 LAB — CBC WITH DIFFERENTIAL/PLATELET
Basophils Absolute: 0.1 x10E3/uL (ref 0.0–0.2)
Basos: 1 %
EOS (ABSOLUTE): 0.2 x10E3/uL (ref 0.0–0.4)
Eos: 3 %
Hematocrit: 46.4 % (ref 37.5–51.0)
Hemoglobin: 15.2 g/dL (ref 13.0–17.7)
Immature Grans (Abs): 0 x10E3/uL (ref 0.0–0.1)
Immature Granulocytes: 0 %
Lymphocytes Absolute: 2.4 x10E3/uL (ref 0.7–3.1)
Lymphs: 29 %
MCH: 28.6 pg (ref 26.6–33.0)
MCHC: 32.8 g/dL (ref 31.5–35.7)
MCV: 87 fL (ref 79–97)
Monocytes Absolute: 0.7 x10E3/uL (ref 0.1–0.9)
Monocytes: 9 %
Neutrophils Absolute: 4.8 x10E3/uL (ref 1.4–7.0)
Neutrophils: 58 %
Platelets: 318 x10E3/uL (ref 150–450)
RBC: 5.31 x10E6/uL (ref 4.14–5.80)
RDW: 13.9 % (ref 11.6–15.4)
WBC: 8.2 x10E3/uL (ref 3.4–10.8)

## 2024-12-26 LAB — CMP14+EGFR
ALT: 19 IU/L (ref 0–44)
AST: 19 IU/L (ref 0–40)
Albumin: 4.9 g/dL (ref 3.9–4.9)
Alkaline Phosphatase: 69 IU/L (ref 47–123)
BUN/Creatinine Ratio: 15 (ref 10–24)
BUN: 12 mg/dL (ref 8–27)
Bilirubin Total: 0.4 mg/dL (ref 0.0–1.2)
CO2: 19 mmol/L — AB (ref 20–29)
Calcium: 10 mg/dL (ref 8.6–10.2)
Chloride: 98 mmol/L (ref 96–106)
Creatinine, Ser: 0.79 mg/dL (ref 0.76–1.27)
Globulin, Total: 2.4 g/dL (ref 1.5–4.5)
Glucose: 143 mg/dL — AB (ref 70–99)
Potassium: 5.2 mmol/L (ref 3.5–5.2)
Sodium: 139 mmol/L (ref 134–144)
Total Protein: 7.3 g/dL (ref 6.0–8.5)
eGFR: 99 mL/min/1.73

## 2024-12-26 LAB — LIPID PANEL
Cholesterol, Total: 182 mg/dL (ref 100–199)
HDL: 50 mg/dL
LDL CALC COMMENT:: 3.6 ratio (ref 0.0–5.0)
LDL Chol Calc (NIH): 109 mg/dL — AB (ref 0–99)
Triglycerides: 130 mg/dL (ref 0–149)
VLDL Cholesterol Cal: 23 mg/dL (ref 5–40)

## 2024-12-26 LAB — PSA, TOTAL AND FREE
PSA, Free Pct: 12 %
PSA, Free: 1.12 ng/mL
Prostate Specific Ag, Serum: 9.3 ng/mL — AB (ref 0.0–4.0)

## 2024-12-29 ENCOUNTER — Other Ambulatory Visit: Payer: Self-pay

## 2024-12-29 MED ORDER — DROPLET PEN NEEDLES 32G X 4 MM MISC: 3 refills | Status: AC

## 2025-01-01 ENCOUNTER — Other Ambulatory Visit: Payer: Self-pay

## 2025-01-01 MED ORDER — FREESTYLE LIBRE 3 READER DEVI: 0 refills | Status: AC

## 2025-01-01 MED ORDER — FREESTYLE LIBRE 3 PLUS SENSOR MISC: 3 refills | Status: AC

## 2025-01-10 ENCOUNTER — Ambulatory Visit: Payer: Medicare Other

## 2025-01-10 ENCOUNTER — Ambulatory Visit: Payer: Self-pay | Admitting: Urology

## 2025-01-10 VITALS — BP 132/82 | HR 93 | Ht 69.0 in | Wt 164.0 lb

## 2025-01-10 DIAGNOSIS — Z0000 Encounter for general adult medical examination without abnormal findings: Secondary | ICD-10-CM | POA: Diagnosis not present

## 2025-01-10 DIAGNOSIS — R972 Elevated prostate specific antigen [PSA]: Secondary | ICD-10-CM

## 2025-01-10 NOTE — Progress Notes (Signed)
 "  Chief Complaint  Patient presents with   Medicare Wellness     Subjective:   Patrick Valentine is a 64 y.o. male who presents for a Medicare Annual Wellness Visit.  Visit info / Clinical Intake: Medicare Wellness Visit Type:: Subsequent Annual Wellness Visit Persons participating in visit and providing information:: patient Medicare Wellness Visit Mode:: Telephone If telephone:: video declined Since this visit was completed virtually, some vitals may be partially provided or unavailable. Missing vitals are due to the limitations of the virtual format.: Unable to obtain vitals - no equipment If Telephone or Video please confirm:: I connected with patient using audio/video enable telemedicine. I verified patient identity with two identifiers, discussed telehealth limitations, and patient agreed to proceed. Patient Location:: home Provider Location:: office Interpreter Needed?: No Pre-visit prep was completed: yes AWV questionnaire completed by patient prior to visit?: yes Date:: 01/09/25 Living arrangements:: (Patient-Rptd) lives with spouse/significant other Patient's Overall Health Status Rating: (Patient-Rptd) very good Typical amount of pain: (Patient-Rptd) some Does pain affect daily life?: (Patient-Rptd) no Are you currently prescribed opioids?: no  Dietary Habits and Nutritional Risks How many meals a day?: (Patient-Rptd) 3 Eats fruit and vegetables daily?: (Patient-Rptd) yes Most meals are obtained by: (Patient-Rptd) preparing own meals In the last 2 weeks, have you had any of the following?: none Diabetic:: (!) yes Any non-healing wounds?: (!) yes How often do you check your BS?: -- (everyday) Would you like to be referred to a Nutritionist or for Diabetic Management? : no  Functional Status Activities of Daily Living (to include ambulation/medication): (Patient-Rptd) Independent Ambulation: (Patient-Rptd) Independent Medication Administration: (Patient-Rptd)  Independent Home Management (perform basic housework or laundry): (Patient-Rptd) Independent Manage your own finances?: (Patient-Rptd) yes Primary transportation is: (Patient-Rptd) family / friends Concerns about vision?: no *vision screening is required for WTM* (7/25) Concerns about hearing?: (!) yes Uses hearing aids?: (!) yes  Fall Screening Falls in the past year?: (Patient-Rptd) 0 Number of falls in past year: 0 Was there an injury with Fall?: 0 Fall Risk Category Calculator: 0 Patient Fall Risk Level: Low Fall Risk  Fall Risk Patient at Risk for Falls Due to: No Fall Risks Fall risk Follow up: Falls evaluation completed; Education provided  Home and Transportation Safety: All rugs have non-skid backing?: (Patient-Rptd) yes All stairs or steps have railings?: (Patient-Rptd) yes Grab bars in the bathtub or shower?: (!) (Patient-Rptd) no Have non-skid surface in bathtub or shower?: (Patient-Rptd) yes Good home lighting?: (Patient-Rptd) yes Regular seat belt use?: (Patient-Rptd) yes Hospital stays in the last year:: (Patient-Rptd) no  Cognitive Assessment Difficulty concentrating, remembering, or making decisions? : (Patient-Rptd) no Will 6CIT or Mini Cog be Completed: yes What year is it?: 0 points Give patient an address phrase to remember (5 components): [address] [place] what time is it?: 0 points Count backwards from 20 to 1: 0 points Say the months of the year in reverse: 0 points Repeat the address phrase from earlier: 0 points  Advance Directives (For Healthcare) Does Patient Have a Medical Advance Directive?: No Would patient like information on creating a medical advance directive?: No - Patient declined  Reviewed/Updated  Reviewed/Updated: Reviewed All (Medical, Surgical, Family, Medications, Allergies, Care Teams, Patient Goals)    Allergies (verified) Patient has no known allergies.   Current Medications (verified) Outpatient  Encounter Medications as of 01/10/2025  Medication Sig   Accu-Chek FastClix Lancets MISC check blood sugar BID Dx E11.9   Acetylcysteine 600 MG CAPS Take by mouth.  Alcohol Swabs (B-D SINGLE USE SWABS REGULAR) PADS Test BID   Blood Glucose Monitoring Suppl DEVI 1 each by Does not apply route in the morning, at noon, and at bedtime. May substitute to any manufacturer covered by patient's insurance.   Continuous Glucose Receiver (FREESTYLE LIBRE 3 READER) DEVI Use to monitor blood sugar with sensors   Continuous Glucose Sensor (FREESTYLE LIBRE 3 PLUS SENSOR) MISC Change sensor every 15 days.   dapagliflozin  propanediol (FARXIGA ) 10 MG TABS tablet Take 1 tablet (10 mg total) by mouth daily before breakfast.   glucose blood (ACCU-CHEK GUIDE TEST) test strip check blood sugar BID Dx E11.9   insulin glargine  (LANTUS  SOLOSTAR) 100 UNIT/ML Solostar Pen Inject 15 Units into the skin daily.   Insulin Pen Needle (DROPLET PEN NEEDLES) 32G X 4 MM MISC check blood sugar BID Dx E11.9   metFORMIN  (GLUCOPHAGE ) 1000 MG tablet Take 1 tablet (1,000 mg total) by mouth 2 (two) times daily with a meal.   rosuvastatin  (CRESTOR ) 20 MG tablet Take 1 tablet (20 mg total) by mouth daily.   No facility-administered encounter medications on file as of 01/10/2025.    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  Tobacco Use   Smoking status: Former   Smokeless tobacco: Never  Vaping Use   Vaping status: Never Used  Substance and Sexual Activity   Alcohol use: Never   Drug  use: Never   Sexual activity: Yes   Tobacco Counseling Counseling given: Yes  SDOH Screenings   Food Insecurity: No Food Insecurity (01/10/2025)  Housing: Unknown (01/10/2025)  Transportation Needs: No Transportation Needs (01/10/2025)  Utilities: Not At Risk (01/10/2025)  Alcohol Screen: Low Risk (01/10/2024)  Depression (PHQ2-9): Low Risk (01/10/2025)  Financial Resource Strain: Low Risk (09/29/2024)  Physical Activity: Insufficiently Active (01/10/2025)  Social Connections: Socially Integrated (01/10/2025)  Stress: No Stress Concern Present (01/10/2025)  Tobacco Use: Medium Risk (01/10/2025)  Health Literacy: Inadequate Health Literacy (01/10/2025)   See flowsheets for full screening details  Depression Screen PHQ 2 & 9 Depression Scale- Over the past 2 weeks, how often have you been bothered by any of the following problems? Little interest or pleasure in doing things: 0 Feeling down, depressed, or hopeless (PHQ Adolescent also includes...irritable): 0 PHQ-2 Total Score: 0     Goals Addressed   None          Objective:    Today's Vitals   01/10/25 1223  BP: 132/82  Pulse: 93  Weight: 164 lb (74.4 kg)  Height: 5' 9 (1.753 m)   Body mass index is 24.22 kg/m.  Hearing/Vision screen No results found. Immunizations and Health Maintenance Health Maintenance  Topic Date Due   HIV Screening  Never done   Zoster Vaccines- Shingrix (1 of 2) Never done   COVID-19 Vaccine (5 - 2025-26 season) 01/10/2025 (Originally 08/14/2024)   Pneumococcal Vaccine: 50+ Years (3 of  3 - PCV20 or PCV21) 10/02/2025 (Originally 03/18/2022)   Diabetic kidney evaluation - Urine ACR  04/11/2025   HEMOGLOBIN A1C  06/24/2025   OPHTHALMOLOGY EXAM  07/07/2025   DTaP/Tdap/Td (4 - Td or Tdap) 11/28/2025   Diabetic kidney evaluation - eGFR measurement  12/25/2025   FOOT EXAM  12/25/2025   Medicare Annual Wellness (AWV)  01/10/2026   Colonoscopy  06/13/2027   Influenza Vaccine  Completed   HPV VACCINES  (No Doses Required) Completed   Hepatitis C Screening  Completed   Hepatitis B Vaccines 19-59 Average Risk  Aged Out   Meningococcal B Vaccine  Aged Out        Assessment/Plan:  This is a routine wellness examination for Patrick Valentine.  Patient Care Team: Gladis Mustard, FNP as PCP - General (Nurse Practitioner) Albertus, Gordy HERO, MD as Consulting Physician (Gastroenterology) Billee Mliss BIRCH, RPH-CPP (Pharmacist) Fraser Clement, MD as Referring Physician (Ophthalmology) Alvaro Ricardo KATHEE Raddle., MD as Consulting Physician (Urology) Pyrtle, Gordy HERO, MD as Consulting Physician (Gastroenterology)  I have personally reviewed and noted the following in the patients chart:   Medical and social history Use of alcohol, tobacco or illicit drugs  Current medications and supplements including opioid prescriptions. Functional ability and status Nutritional status Physical activity Advanced directives List of other physicians Hospitalizations, surgeries, and ER visits in previous 12 months Vitals Screenings to include cognitive, depression, and falls Referrals and appointments  No orders of the defined types were placed in this encounter.  In addition, I have reviewed and discussed with patient certain preventive protocols, quality metrics, and best practice recommendations. A written personalized care plan for preventive services as well as general preventive health recommendations were provided to patient.   Ozie Ned, CMA   01/10/2025   Return in 1 year (on 01/10/2026).  After Visit Summary: (MyChart) Due to this being a telephonic visit, the after visit summary with patients personalized plan was offered to patient via MyChart   Nurse Notes: Pt is aware and due the following: Shingles vaccine, per pt will discuss w/pcp for recommendation, HIV screening--get at next OV w/pcp "

## 2025-01-10 NOTE — Patient Instructions (Signed)
 Patrick Valentine,  Thank you for taking the time for your Medicare Wellness Visit. I appreciate your continued commitment to your health goals. Please review the care plan we discussed, and feel free to reach out if I can assist you further.  Please note that Annual Wellness Visits do not include a physical exam. Some assessments may be limited, especially if the visit was conducted virtually. If needed, we may recommend an in-person follow-up with your provider.  Ongoing Care Seeing your primary care provider every 3 to 6 months helps us  monitor your health and provide consistent, personalized care.   Referrals If a referral was made during today's visit and you haven't received any updates within two weeks, please contact the referred provider directly to check on the status.  Recommended Screenings:  Health Maintenance  Topic Date Due   HIV Screening  Never done   Zoster (Shingles) Vaccine (1 of 2) Never done   COVID-19 Vaccine (5 - 2025-26 season) 01/10/2025*   Pneumococcal Vaccine for age over 65 (3 of 3 - PCV20 or PCV21) 10/02/2025*   Kidney health urinalysis for diabetes  04/11/2025   Hemoglobin A1C  06/24/2025   Eye exam for diabetics  07/07/2025   DTaP/Tdap/Td vaccine (4 - Td or Tdap) 11/28/2025   Yearly kidney function blood test for diabetes  12/25/2025   Complete foot exam   12/25/2025   Medicare Annual Wellness Visit  01/10/2026   Colon Cancer Screening  06/13/2027   Flu Shot  Completed   HPV Vaccine (No Doses Required) Completed   Hepatitis C Screening  Completed   Hepatitis B Vaccine  Aged Out   Meningitis B Vaccine  Aged Out  *Topic was postponed. The date shown is not the original due date.       01/09/2025    9:59 AM  Advanced Directives  Does Patient Have a Medical Advance Directive? No  Would patient like information on creating a medical advance directive? No - Patient declined    Vision: Annual vision screenings are recommended for early detection of  glaucoma, cataracts, and diabetic retinopathy. These exams can also reveal signs of chronic conditions such as diabetes and high blood pressure.  Dental: Annual dental screenings help detect early signs of oral cancer, gum disease, and other conditions linked to overall health, including heart disease and diabetes.  Please see the attached documents for additional preventive care recommendations.

## 2025-03-22 ENCOUNTER — Ambulatory Visit: Admitting: Nurse Practitioner

## 2025-03-28 ENCOUNTER — Ambulatory Visit: Payer: Self-pay | Admitting: Urology

## 2026-01-11 ENCOUNTER — Ambulatory Visit
